# Patient Record
Sex: Female | Born: 1940 | Race: Black or African American | Hispanic: No | Smoking: Never smoker
Health system: Southern US, Community
[De-identification: ages and names within clinical notes are randomized; demographics above are authoritative.]

## PROBLEM LIST (undated history)

## (undated) DIAGNOSIS — E785 Hyperlipidemia, unspecified: Secondary | ICD-10-CM

## (undated) DIAGNOSIS — J45909 Unspecified asthma, uncomplicated: Secondary | ICD-10-CM

## (undated) DIAGNOSIS — I509 Heart failure, unspecified: Secondary | ICD-10-CM

## (undated) DIAGNOSIS — K219 Gastro-esophageal reflux disease without esophagitis: Secondary | ICD-10-CM

## (undated) DIAGNOSIS — I10 Essential (primary) hypertension: Secondary | ICD-10-CM

## (undated) HISTORY — PX: ABDOMINAL HYSTERECTOMY: SHX81

## (undated) HISTORY — PX: BREAST LUMPECTOMY: SHX2

---

## 2017-08-20 ENCOUNTER — Encounter (HOSPITAL_COMMUNITY): Payer: Self-pay

## 2017-08-20 ENCOUNTER — Emergency Department (HOSPITAL_COMMUNITY): Payer: Medicare Other

## 2017-08-20 ENCOUNTER — Inpatient Hospital Stay (HOSPITAL_COMMUNITY)
Admission: EM | Admit: 2017-08-20 | Discharge: 2017-08-24 | DRG: 193 | Disposition: A | Discharge status: Home / Self Care | Payer: Medicare Other | Attending: Internal Medicine | Admitting: Internal Medicine

## 2017-08-20 DIAGNOSIS — I130 Hypertensive heart and chronic kidney disease with heart failure and stage 1 through stage 4 chronic kidney disease, or unspecified chronic kidney disease: Secondary | ICD-10-CM | POA: Diagnosis present

## 2017-08-20 DIAGNOSIS — D860 Sarcoidosis of lung: Secondary | ICD-10-CM

## 2017-08-20 DIAGNOSIS — L8990 Pressure ulcer of unspecified site, unspecified stage: Secondary | ICD-10-CM

## 2017-08-20 DIAGNOSIS — D649 Anemia, unspecified: Secondary | ICD-10-CM | POA: Diagnosis present

## 2017-08-20 DIAGNOSIS — Z7982 Long term (current) use of aspirin: Secondary | ICD-10-CM

## 2017-08-20 DIAGNOSIS — D509 Iron deficiency anemia, unspecified: Secondary | ICD-10-CM | POA: Diagnosis present

## 2017-08-20 DIAGNOSIS — I4581 Long QT syndrome: Secondary | ICD-10-CM | POA: Diagnosis present

## 2017-08-20 DIAGNOSIS — I5033 Acute on chronic diastolic (congestive) heart failure: Secondary | ICD-10-CM | POA: Diagnosis present

## 2017-08-20 DIAGNOSIS — I272 Pulmonary hypertension, unspecified: Secondary | ICD-10-CM | POA: Diagnosis present

## 2017-08-20 DIAGNOSIS — Z7901 Long term (current) use of anticoagulants: Secondary | ICD-10-CM | POA: Diagnosis not present

## 2017-08-20 DIAGNOSIS — Z888 Allergy status to other drugs, medicaments and biological substances status: Secondary | ICD-10-CM

## 2017-08-20 DIAGNOSIS — J189 Pneumonia, unspecified organism: Principal | ICD-10-CM | POA: Diagnosis present

## 2017-08-20 DIAGNOSIS — Z7951 Long term (current) use of inhaled steroids: Secondary | ICD-10-CM

## 2017-08-20 DIAGNOSIS — I480 Paroxysmal atrial fibrillation: Secondary | ICD-10-CM | POA: Diagnosis present

## 2017-08-20 DIAGNOSIS — J440 Chronic obstructive pulmonary disease with acute lower respiratory infection: Secondary | ICD-10-CM | POA: Diagnosis present

## 2017-08-20 DIAGNOSIS — Z90710 Acquired absence of both cervix and uterus: Secondary | ICD-10-CM

## 2017-08-20 DIAGNOSIS — Z885 Allergy status to narcotic agent status: Secondary | ICD-10-CM

## 2017-08-20 DIAGNOSIS — E872 Acidosis: Secondary | ICD-10-CM | POA: Diagnosis present

## 2017-08-20 DIAGNOSIS — G4733 Obstructive sleep apnea (adult) (pediatric): Secondary | ICD-10-CM | POA: Diagnosis present

## 2017-08-20 DIAGNOSIS — L89609 Pressure ulcer of unspecified heel, unspecified stage: Secondary | ICD-10-CM

## 2017-08-20 DIAGNOSIS — K219 Gastro-esophageal reflux disease without esophagitis: Secondary | ICD-10-CM | POA: Diagnosis present

## 2017-08-20 DIAGNOSIS — E785 Hyperlipidemia, unspecified: Secondary | ICD-10-CM | POA: Diagnosis present

## 2017-08-20 DIAGNOSIS — I4510 Unspecified right bundle-branch block: Secondary | ICD-10-CM | POA: Diagnosis present

## 2017-08-20 DIAGNOSIS — L89620 Pressure ulcer of left heel, unstageable: Secondary | ICD-10-CM | POA: Diagnosis not present

## 2017-08-20 DIAGNOSIS — I5032 Chronic diastolic (congestive) heart failure: Secondary | ICD-10-CM

## 2017-08-20 DIAGNOSIS — L89619 Pressure ulcer of right heel, unspecified stage: Secondary | ICD-10-CM | POA: Diagnosis not present

## 2017-08-20 DIAGNOSIS — T462X5A Adverse effect of other antidysrhythmic drugs, initial encounter: Secondary | ICD-10-CM | POA: Diagnosis present

## 2017-08-20 DIAGNOSIS — R0902 Hypoxemia: Secondary | ICD-10-CM

## 2017-08-20 DIAGNOSIS — Z79899 Other long term (current) drug therapy: Secondary | ICD-10-CM

## 2017-08-20 DIAGNOSIS — L89610 Pressure ulcer of right heel, unstageable: Secondary | ICD-10-CM | POA: Diagnosis present

## 2017-08-20 DIAGNOSIS — N182 Chronic kidney disease, stage 2 (mild): Secondary | ICD-10-CM | POA: Diagnosis present

## 2017-08-20 HISTORY — DX: Heart failure, unspecified: I50.9

## 2017-08-20 HISTORY — DX: Essential (primary) hypertension: I10

## 2017-08-20 HISTORY — DX: Gastro-esophageal reflux disease without esophagitis: K21.9

## 2017-08-20 HISTORY — DX: Unspecified asthma, uncomplicated: J45.909

## 2017-08-20 HISTORY — DX: Hyperlipidemia, unspecified: E78.5

## 2017-08-20 LAB — BRAIN NATRIURETIC PEPTIDE: B NATRIURETIC PEPTIDE 5: 386.4 pg/mL — AB (ref 0.0–100.0)

## 2017-08-20 LAB — CBC
HEMATOCRIT: 31.2 % — AB (ref 36.0–46.0)
HEMOGLOBIN: 9.6 g/dL — AB (ref 12.0–15.0)
MCH: 23.4 pg — ABNORMAL LOW (ref 26.0–34.0)
MCHC: 30.8 g/dL (ref 30.0–36.0)
MCV: 75.9 fL — ABNORMAL LOW (ref 78.0–100.0)
Platelets: 166 10*3/uL (ref 150–400)
RBC: 4.11 MIL/uL (ref 3.87–5.11)
RDW: 19.5 % — ABNORMAL HIGH (ref 11.5–15.5)
WBC: 6 10*3/uL (ref 4.0–10.5)

## 2017-08-20 LAB — COMPREHENSIVE METABOLIC PANEL
ALBUMIN: 3.4 g/dL — AB (ref 3.5–5.0)
ALK PHOS: 63 U/L (ref 38–126)
ALT: 19 U/L (ref 14–54)
AST: 42 U/L — AB (ref 15–41)
Anion gap: 15 (ref 5–15)
BILIRUBIN TOTAL: 1.2 mg/dL (ref 0.3–1.2)
BUN: 20 mg/dL (ref 6–20)
CALCIUM: 9.6 mg/dL (ref 8.9–10.3)
CO2: 22 mmol/L (ref 22–32)
CREATININE: 1.5 mg/dL — AB (ref 0.44–1.00)
Chloride: 103 mmol/L (ref 101–111)
GFR calc Af Amer: 38 mL/min — ABNORMAL LOW (ref >60)
GFR calc non Af Amer: 33 mL/min — ABNORMAL LOW (ref >60)
GLUCOSE: 82 mg/dL (ref 65–99)
Potassium: 4.4 mmol/L (ref 3.5–5.1)
SODIUM: 140 mmol/L (ref 135–145)
TOTAL PROTEIN: 7.6 g/dL (ref 6.5–8.1)

## 2017-08-20 LAB — TROPONIN I: Troponin I: <0.03 ng/mL (ref <0.03)

## 2017-08-20 LAB — I-STAT CG4 LACTIC ACID, ED: Lactic Acid, Venous: 3.68 mmol/L (ref 0.5–1.9)

## 2017-08-20 LAB — PROCALCITONIN: Procalcitonin: 0.1 ng/mL

## 2017-08-20 MED ORDER — SODIUM CHLORIDE 0.9 % IV SOLN
500.0000 mg | Freq: Once | INTRAVENOUS | Status: AC
  Administered 2017-08-20: 500 mg via INTRAVENOUS
  Filled 2017-08-20 (×2): qty 500

## 2017-08-20 MED ORDER — POTASSIUM CHLORIDE CRYS ER 20 MEQ PO TBCR
20.0000 meq | EXTENDED_RELEASE_TABLET | Freq: Every day | ORAL | Status: DC
  Administered 2017-08-20 – 2017-08-24 (×5): 20 meq via ORAL
  Filled 2017-08-20 (×5): qty 1

## 2017-08-20 MED ORDER — ATORVASTATIN CALCIUM 80 MG PO TABS
80.0000 mg | ORAL_TABLET | Freq: Every day | ORAL | Status: DC
  Administered 2017-08-20 – 2017-08-23 (×4): 80 mg via ORAL
  Filled 2017-08-20 (×4): qty 1

## 2017-08-20 MED ORDER — IPRATROPIUM-ALBUTEROL 0.5-2.5 (3) MG/3ML IN SOLN
3.0000 mL | Freq: Four times a day (QID) | RESPIRATORY_TRACT | Status: DC
  Administered 2017-08-20: 3 mL via RESPIRATORY_TRACT
  Filled 2017-08-20: qty 3

## 2017-08-20 MED ORDER — SODIUM CHLORIDE 0.9 % IV SOLN
100.0000 mg | Freq: Two times a day (BID) | INTRAVENOUS | Status: DC
  Administered 2017-08-20 – 2017-08-21 (×3): 100 mg via INTRAVENOUS
  Filled 2017-08-20 (×4): qty 100

## 2017-08-20 MED ORDER — SODIUM CHLORIDE 0.9 % IV BOLUS (SEPSIS)
500.0000 mL | Freq: Once | INTRAVENOUS | Status: AC
  Administered 2017-08-20: 500 mL via INTRAVENOUS

## 2017-08-20 MED ORDER — ALLOPURINOL 100 MG PO TABS
200.0000 mg | ORAL_TABLET | Freq: Every day | ORAL | Status: DC
  Administered 2017-08-21 – 2017-08-24 (×4): 200 mg via ORAL
  Filled 2017-08-20 (×5): qty 2

## 2017-08-20 MED ORDER — ASPIRIN EC 81 MG PO TBEC
81.0000 mg | DELAYED_RELEASE_TABLET | Freq: Every day | ORAL | Status: DC
  Administered 2017-08-20 – 2017-08-24 (×5): 81 mg via ORAL
  Filled 2017-08-20 (×5): qty 1

## 2017-08-20 MED ORDER — METOPROLOL SUCCINATE ER 50 MG PO TB24
50.0000 mg | ORAL_TABLET | Freq: Every day | ORAL | Status: DC
  Administered 2017-08-20 – 2017-08-24 (×5): 50 mg via ORAL
  Filled 2017-08-20 (×5): qty 1

## 2017-08-20 MED ORDER — ACETAMINOPHEN 650 MG RE SUPP: 650.0000 mg | Freq: Four times a day (QID) | RECTAL | Status: DC | PRN

## 2017-08-20 MED ORDER — MOMETASONE FURO-FORMOTEROL FUM 200-5 MCG/ACT IN AERO
2.0000 | INHALATION_SPRAY | Freq: Two times a day (BID) | RESPIRATORY_TRACT | Status: DC
  Administered 2017-08-21 – 2017-08-24 (×7): 2 via RESPIRATORY_TRACT
  Filled 2017-08-20: qty 8.8

## 2017-08-20 MED ORDER — AMIODARONE HCL 200 MG PO TABS
200.0000 mg | ORAL_TABLET | Freq: Every day | ORAL | Status: DC
  Administered 2017-08-21 – 2017-08-24 (×4): 200 mg via ORAL
  Filled 2017-08-20 (×5): qty 1

## 2017-08-20 MED ORDER — NIFEDIPINE ER OSMOTIC RELEASE 90 MG PO TB24
90.0000 mg | ORAL_TABLET | Freq: Every day | ORAL | Status: DC
  Administered 2017-08-21 – 2017-08-24 (×4): 90 mg via ORAL
  Filled 2017-08-20 (×4): qty 1

## 2017-08-20 MED ORDER — PANTOPRAZOLE SODIUM 40 MG PO TBEC
40.0000 mg | DELAYED_RELEASE_TABLET | Freq: Every day | ORAL | Status: DC
  Administered 2017-08-20 – 2017-08-24 (×5): 40 mg via ORAL
  Filled 2017-08-20 (×5): qty 1

## 2017-08-20 MED ORDER — PREDNISONE 20 MG PO TABS
40.0000 mg | ORAL_TABLET | Freq: Every day | ORAL | Status: DC
  Administered 2017-08-20 – 2017-08-22 (×2): 40 mg via ORAL
  Filled 2017-08-20 (×2): qty 2

## 2017-08-20 MED ORDER — SODIUM CHLORIDE 0.9 % IV SOLN
1.0000 g | INTRAVENOUS | Status: DC
  Administered 2017-08-21 – 2017-08-24 (×4): 1 g via INTRAVENOUS
  Filled 2017-08-20 (×4): qty 10

## 2017-08-20 MED ORDER — APIXABAN 5 MG PO TABS
5.0000 mg | ORAL_TABLET | Freq: Two times a day (BID) | ORAL | Status: DC
  Administered 2017-08-20 – 2017-08-24 (×8): 5 mg via ORAL
  Filled 2017-08-20 (×8): qty 1

## 2017-08-20 MED ORDER — ACETAMINOPHEN 325 MG PO TABS
650.0000 mg | ORAL_TABLET | Freq: Four times a day (QID) | ORAL | Status: DC | PRN
  Administered 2017-08-21 – 2017-08-23 (×2): 650 mg via ORAL
  Filled 2017-08-20 (×2): qty 2

## 2017-08-20 NOTE — ED Notes (Signed)
Unsuccessful attempt to start IV x 1. Blood work obtained, labeled, and is by the bedside

## 2017-08-20 NOTE — ED Notes (Signed)
Delay explained to patient and family. Pt and family verbalized understanding of delay. Will continue to round.

## 2017-08-20 NOTE — ED Notes (Signed)
Writer notified EDP Campos of abnormal I stat lactic result.  

## 2017-08-20 NOTE — ED Notes (Signed)
Pt placed on 2L, O2 increased to 100%. When O2 titrated down, pt O2 then decreased back to 89%.

## 2017-08-20 NOTE — ED Notes (Addendum)
Unsuccessful attempt to draw second culture. Will notify phlebotomy

## 2017-08-20 NOTE — ED Notes (Signed)
ED Provider at bedside. 

## 2017-08-20 NOTE — ED Triage Notes (Signed)
Pt with hx CHF from [facility] for SOB and productive cough x 2 days. Pt 85% on RA on arrival to PCP, placed on 4L [place]. Per EMS, pt with CXR positive for pneumonia, 20 mg lasix and 1g rocephin IM administered in office. 15mg  albuterol and 0.5 mg atrovent given en route. Denies fever, N/V/D. A&Ox4. EMS VS: 97% 4L [place], 132/70, HR 84, 18 RR, CBG 94.

## 2017-08-20 NOTE — ED Notes (Signed)
Patient transported to X-ray 

## 2017-08-20 NOTE — ED Notes (Signed)
Attempted report x1. 

## 2017-08-20 NOTE — H&P (Signed)
History and Physical    Heather Mckay OHY:[id number] DOB: 17-Jan-1941 DOA: 08/20/2017  PCP: Dionicia Abler, MD   Patient coming from: Home  Chief Complaint: SOB, cough.  HPI: Heather Mckay is a 77 y.o. female with medical history- per care everywhere, significant for- pulmonary sarcoidosis, pulmonary hypertension, atrial fibrillation, OSA, HTN, DM2- ?  Secondary to steroid use, asthma, who presented to the ED complaints of shortness of breath and cough of 2 days duration.  Patient endorses feeling ill over the past 2 days.  No fevers, reports feeling cold but no shaking chills. Mild Chronic and symmetric lower extremity swelling over 2 months duration, no redness or warmth.  Patient reports stable unchanged weight 163 lbs.  Patient reports compliance with Lasix 80 a.m. 40 p.m, and denies excessive salt in diet. Positive history of sick contacts -grandkids.  Not on home O2.  Patient reports he has been off steroids for at least 5-6 months.  Patient presented to her pulmonologist office today, was found to be hypoxic- 85% on room air, chest x-ray suggesting pneumonia and was "to go to the ED via EMS.  It was given 1 g Rocephin and 20 mg Lasix, albuterol and Atrovent en route.  ED Course: O2 sats 86% on room air, placed on 2-4 L O2. WBC-normal 6, creatinine 1.5. BNP- 386. Trop <0.03.  Lactic acid was also checked and was 3.68. Chest x-ray-CHF with mild interstitial edema patchy opacities in the mid and lower lungs-pulmonary edema or superimposed pneumonia.  Hospitalist was called to admit for possible pneumonia versus fluid overload, EDP felt more fluid overload so lactic acidosis was not pursued. Patient was given IV azithromycin for CAP coverage.   Review of Systems: As per HPI otherwise 10 point review of systems negative.    Past Medical History:  Diagnosis Date  . Asthma   . CHF (congestive heart failure) ([place])   . GERD (gastroesophageal reflux disease)   . HLD (hyperlipidemia)   .  Hypertension     Past Surgical History:  Procedure Laterality Date  . ABDOMINAL HYSTERECTOMY    . BREAST LUMPECTOMY Right      reports that  has never smoked. she has never used smokeless tobacco. She reports that she does not drink alcohol or use drugs.  Allergies  Allergen Reactions  . Hydrocodone     Itching  . Imdur [Isosorbide Dinitrate]     Itching  . Percocet [Oxycodone-Acetaminophen]     Itching    No family history on file.  Prior to Admission medications   Medication Sig Start Date End Date Taking? Authorizing Provider  allopurinol (ZYLOPRIM) 100 MG tablet Take 200 mg by mouth daily. 06/02/17  Yes [provider]  amiodarone (PACERONE) 200 MG tablet Take 200 mg by mouth daily. 08/14/17  Yes [provider]  apixaban (ELIQUIS) 5 MG TABS tablet Take 5 mg by mouth 2 (two) times daily. 02/13/17  Yes [provider]  aspirin EC 81 MG tablet Take 81 mg by mouth daily. 08/08/11  Yes [provider]  atorvastatin (LIPITOR) 80 MG tablet Take 80 mg by mouth at bedtime. 11/14/16  Yes [provider]  CALCIUM PO Take 600 mg by mouth daily. 08/17/11  Yes [provider]  celecoxib (CELEBREX) 100 MG capsule Take 100 mg by mouth 2 (two) times daily. 07/31/17  Yes [provider]  diclofenac sodium (VOLTAREN) 1 % GEL Apply 1 application topically as needed (on her hand).  08/06/17  Yes [provider]  ferrous sulfate 325 (65 FE) MG tablet Take 1 tablet by mouth daily. 05/24/17 05/24/18 Yes [provider]  furosemide (LASIX) 40 MG tablet Take 40-80 mg by mouth See admin instructions. Take 40 mg in the morning and 80 mg in the evening 08/18/17  Yes [provider]  metoprolol succinate (TOPROL-XL) 50 MG 24 hr tablet Take 50 mg by mouth daily. 07/26/17  Yes [provider]  NIFEdipine (ADALAT CC) 90 MG 24 hr tablet Take 90 mg by mouth daily. 06/07/17  Yes [provider]  pantoprazole  (PROTONIX) 40 MG tablet Take 40 mg by mouth. 09/20/16  Yes [provider]  potassium chloride SA (K-DUR,KLOR-CON) 20 MEQ tablet Take 20 mEq by mouth daily. 05/24/17 05/24/18 Yes [provider]  SANTYL ointment Apply 1 application topically as needed. 06/26/17  Yes [provider]  SYMBICORT 160-4.5 MCG/ACT inhaler Inhale 2 puffs into the lungs 2 (two) times daily. 06/01/17  Yes [provider]  tetrahydrozoline 0.05 % ophthalmic solution Place 2 drops into both eyes as needed.   Yes [provider]  traMADol (ULTRAM) 50 MG tablet Take 50 mg by mouth 2 (two) times daily. 08/15/17  Yes [provider]  traZODone (DESYREL) 50 MG tablet Take 50 mg by mouth as needed. 05/24/17  Yes [provider]    Physical Exam: Vitals:   08/20/17 1900 08/20/17 1915 08/20/17 1945 08/20/17 2000  BP: 140/63 136/63 (!) 149/70 (!) 146/71  Pulse: 82 81 85 83  Resp: (!) 31 (!) 26 (!) 22 (!) 29  Temp:      TempSrc:      SpO2: 95% 96% 100% 97%  Weight:      Height:        Constitutional: NAD, calm, comfortable Vitals:   08/20/17 1900 08/20/17 1915 08/20/17 1945 08/20/17 2000  BP: 140/63 136/63 (!) 149/70 (!) 146/71  Pulse: 82 81 85 83  Resp: (!) 31 (!) 26 (!) 22 (!) 29  Temp:      TempSrc:      SpO2: 95% 96% 100% 97%  Weight:      Height:       Eyes: PERRL, lids and conjunctivae normal ENMT: Mucous membranes are  Mildly dry.. Posterior pharynx clear of any exudate or lesions.Normal dentition.  Neck: normal, supple, no masses, no thyromegaly Respiratory: Diffuse rhonchorous breath sounds, Normal respiratory effort. No accessory muscle use.  Cardiovascular: Regular rate and rhythm,  Trace to +1 pitting pedal edema bilaterally, with mild hyperpigmentation suggesting chronic venostasis. changes 2+ pedal pulses.  Abdomen: no tenderness, no masses palpated. No hepatosplenomegaly. Bowel sounds positive.  Musculoskeletal: no clubbing / cyanosis. No  joint deformity upper and lower extremities. Good ROM, no contractures. Normal muscle tone.  Skin: ~3 by ~3 cm stage II-III ulcer with purulent drainage right heel, with surrounding tenderness, no appreciable redness. Neurologic: CN 2-12 grossly intact. Sensation intact, DTR normal. Strength 5/5 in all 4.  Psychiatric: Normal judgment and insight. Alert and oriented x 3. Normal mood.      Labs on Admission: I have personally reviewed following labs and imaging studies  CBC: Recent Labs  Lab 08/20/17 1410  WBC 6.0  HGB 9.6*  HCT 31.2*  MCV 75.9*  PLT 371   Basic Metabolic Panel: Recent Labs  Lab 08/20/17 1410  NA 140  K 4.4  CL 103  CO2 22  GLUCOSE 82  BUN 20  CREATININE 1.50*  CALCIUM 9.6   GFR: Estimated Creatinine Clearance: 31.9 mL/min (  A) (by C-G formula based on SCr of 1.5 mg/dL (H)). Liver Function Tests: Recent Labs  Lab 08/20/17 1410  AST 42*  ALT 19  ALKPHOS 63  BILITOT 1.2  PROT 7.6  ALBUMIN 3.4*   Coagulation Profile: No results for input(s): INR, PROTIME in the last 168 hours. Cardiac Enzymes: Recent Labs  Lab 08/20/17 1434  TROPONINI <0.03   Radiological Exams on Admission: Dg Chest 2 View  Result Date: 08/20/2017 CLINICAL DATA:  Two days of shortness of breath and productive cough. History of asthma, COPD, gastroesophageal reflux. EXAM: CHEST - 2 VIEW COMPARISON:  None in PACs FINDINGS: The lungs are well-expanded. The cardiac silhouette is enlarged. The pulmonary vascularity is engorged. There are patchy airspace opacities predominantly in the lower lobes. Small amounts of pleural fluid obscure the costophrenic angles. The bony thorax exhibits no acute abnormality. IMPRESSION: CHF with mild interstitial edema. Patchy airspace opacities in the mid and lower lungs may reflect pulmonary edema or superimposed pneumonia. Electronically Signed   By: David  Martinique M.D.   On: 08/20/2017 13:57    EKG: Independently reviewed.  Sinus rhythm. RBBB.  No  priors to compare.  Assessment/Plan Active Problems:   PNA (pneumonia)   Chronic diastolic CHF (congestive heart failure) (HCC)   Pulmonary sarcoidosis (HCC)   Pulmonary hypertension (HCC)  Shortness of breath- with cough X 2days, hypoxic. Differentials- pulmonary sarcoid flare, also pneumonia or pulmonary edema is suggested by chest x-ray. Stable unchanged weights, chronic unchanged lower extremity swelling. BNP- 386-no priors to compare. WBC- 6.  Patient does not meet sepsis criteria, but Lactic acid was checked in ED-elevated at 3.6.  Lactic acidosis likely from hypoxia with poor tissue oxygenation.  Ceftriaxone-given in route and azithromycin given in ED. -Influenza check - Pt on antibiotics for right lower extremity purulent ulcer, which would cover possible CAP- ceftriaxone and doxycycline (Prolong Qtc). - Check Procalcitonin- 0.1- with recs to discorage initiation of antibiotics.  So doubt pulmonary infection. - Start Prednisone 40mg  daily for possible Pulm sarcoid flare -Continue home diuretics -May need home O2 on discharge  Pulmonary Sarcoid- Not on steroids, follows with pulm at [facility]. Current symptoms likely from sarcoid flare- - Prednisone 40mg  daily.  Right heel decubitus ulcer - with purulence drainage , no appreciable surrounding cellulitis. WBC- 6. Does not meet sepsis criteria. Follows with wound care at [facility], . Recent x-ray of wound 3/13.-Negative for osteomyelitis -Repeat left lower extremity x-ray-negative for osteomyelitis -Will start IV doxycycline for Staph and MRSA coverage and ceftriazone for better strep coverage. - Wound care consult  Atrial fibrillation-rate controlled.  Sinus rhythm. On amiodarone , BB and eliquis -Continue home Eliquis, metop, amiodarone  Chronic Diastholic CHF-patient appears euvolemic with stable chronic lower extremity swelling.  X-rays suggest edema.  20mg  IV lasix given enroute. - Hold durietics for  now.  Renal Insufficiency- Cr- 1.5. Per care every where last check- 1.2, prior to that had normal kidney function. - 511mls given for mildly dry mucus membranes, and slight Cr elevation. - Consider Resuming home lasix- 80 mg a.m, and 40 pm tomorrow. - BMP a.m  Prolonged QTC- 509.  Likely secondary to amiodarone. - Hold home trazodone. D/c azithro started for CAP. -Continue amiodarone repeat EKG a.m.    HTN- Stable. - Cont Home metop, Nifedipine,    DVT prophylaxis: ELiquis  Code Status: Full  Family Communication: Grandchild and DAughter in law at bedside  Disposition Plan:2- 3days Consults called: None  Admission status: Inpt, Med Surg  Bethena Roys MD [facility] Pager [phone] From 6PM-2AM.  Otherwise please contact night-coverage [email or web address] Password [email or web address]  08/20/2017, 8:20 PM

## 2017-08-20 NOTE — ED Provider Notes (Signed)
[facility] EMERGENCY DEPARTMENT Provider Note   CSN: [id number] Arrival date & time: 08/20/17  1303     History   Chief Complaint Chief Complaint  Patient presents with  . Shortness of Breath  . Cough    HPI Heather Mckay is a 77 y.o. female.  HPI 77 year old female with a history of asthma congestive heart failure and pulmonary sarcoidosis who presents to the emergency department from her clinic for shortness of breath over the past 48 hours and was noted to be hypoxic on arrival there.  She was placed on 4 L nasal cannula given Rocephin and a breathing treatment and sent to the ER for further evaluation.  Patient reports chills throughout the weekend without productive cough.  She feels better on oxygen.  No unilateral leg swelling.  She does have a history of congestive heart failure and pulmonary sarcoid.  She states she feels better after bronchodilators.  Denies new orthopnea.  Received IV Lasix at the clinic as well   Past Medical History:  Diagnosis Date  . Asthma   . CHF (congestive heart failure) ([place])   . GERD (gastroesophageal reflux disease)   . HLD (hyperlipidemia)   . Hypertension     There are no active problems to display for this patient.   Past Surgical History:  Procedure Laterality Date  . ABDOMINAL HYSTERECTOMY    . BREAST LUMPECTOMY Right     OB History    No data available       Home Medications    Prior to Admission medications   Not on File    Family History No family history on file.  Social History Social History   Tobacco Use  . Smoking status: Never Smoker  . Smokeless tobacco: Never Used  Substance Use Topics  . Alcohol use: No    Frequency: Never  . Drug use: No     Allergies   Hydrocodone; Imdur [isosorbide dinitrate]; and Percocet [oxycodone-acetaminophen]   Review of Systems Review of Systems  All other systems reviewed and are negative.    Physical Exam Updated Vital Signs BP (!)  141/58   Pulse 88   Temp 98.2 F (36.8 C) (Oral)   Resp (!) 24   Ht 5\' 4"  (1.626 m)   Wt 73.9 kg (163 lb)   SpO2 98%   BMI 27.98 kg/m   Physical Exam  Constitutional: She is oriented to person, place, and time. She appears well-developed and well-nourished. No distress.  HENT:  Head: Normocephalic and atraumatic.  Eyes: EOM are normal.  Neck: Normal range of motion.  Cardiovascular: Normal rate, regular rhythm and normal heart sounds.  Pulmonary/Chest:  Rales bilaterally.  No increased work of breathing.  Abdominal: Soft. She exhibits no distension. There is no tenderness.  Musculoskeletal: Normal range of motion.  Neurological: She is alert and oriented to person, place, and time.  Skin: Skin is warm and dry.  Psychiatric: She has a normal mood and affect. Judgment normal.  Nursing note and vitals reviewed.    ED Treatments / Results  Labs (all labs ordered are listed, but only abnormal results are displayed) Labs Reviewed  CBC - Abnormal; Notable for the following components:      Result Value   Hemoglobin 9.6 (*)    HCT 31.2 (*)    MCV 75.9 (*)    MCH 23.4 (*)    RDW 19.5 (*)    All other components within normal limits  I-STAT CG4 LACTIC ACID,  ED - Abnormal; Notable for the following components:   Lactic Acid, Venous 3.68 (*)    All other components within normal limits  CULTURE, BLOOD (ROUTINE X 2)  CULTURE, BLOOD (ROUTINE X 2)  COMPREHENSIVE METABOLIC PANEL  BRAIN NATRIURETIC PEPTIDE  TROPONIN I    EKG  EKG Interpretation  Date/Time:  Monday August 20 2017 13:15:00 EDT Ventricular Rate:  93 PR Interval:    QRS Duration: 143 QT Interval:  409 QTC Calculation: 509 R Axis:   76 Text Interpretation:  Sinus rhythm Right bundle branch block No old tracing to compare Confirmed by Jola Schmidt [phone]) on 08/20/2017 2:33:24 PM       Radiology Dg Chest 2 View  Result Date: 08/20/2017 CLINICAL DATA:  Two days of shortness of breath and productive cough.  History of asthma, COPD, gastroesophageal reflux. EXAM: CHEST - 2 VIEW COMPARISON:  None in PACs FINDINGS: The lungs are well-expanded. The cardiac silhouette is enlarged. The pulmonary vascularity is engorged. There are patchy airspace opacities predominantly in the lower lobes. Small amounts of pleural fluid obscure the costophrenic angles. The bony thorax exhibits no acute abnormality. IMPRESSION: CHF with mild interstitial edema. Patchy airspace opacities in the mid and lower lungs may reflect pulmonary edema or superimposed pneumonia. Electronically Signed   By: David  Martinique M.D.   On: 08/20/2017 13:57    Procedures Procedures (including critical care time)  Medications Ordered in ED Medications  azithromycin (ZITHROMAX) 500 mg in sodium chloride 0.9 % 250 mL IVPB (500 mg Intravenous New Bag/Given 08/20/17 1534)     Initial Impression / Assessment and Plan / ED Course  I have reviewed the triage vital signs and the nursing notes.  Pertinent labs & imaging results that were available during my care of the patient were reviewed by me and considered in my medical decision making (see chart for details).     Patient received Rocephin at the office.  She will be given azithromycin at this time to add for community acquired pneumonia coverage.  She will need admission to the hospital.  IV diuretics given prior to arrival in the emergency department.  She does have some edema on chest x-ray.  May represent combined pneumonia and heart failure versus unilateral edema  Care to Dr Wilson Singer  Final Clinical Impressions(s) / ED Diagnoses   Final diagnoses:  None    ED Discharge Orders    None       Jola Schmidt, MD 08/20/17 [phone]

## 2017-08-20 NOTE — ED Notes (Signed)
Updated to bed assignment 

## 2017-08-20 NOTE — ED Notes (Signed)
Pt reports call light volume is not working. Facilities called. Pt and family aware.

## 2017-08-21 ENCOUNTER — Other Ambulatory Visit: Payer: Self-pay

## 2017-08-21 DIAGNOSIS — I5032 Chronic diastolic (congestive) heart failure: Secondary | ICD-10-CM

## 2017-08-21 DIAGNOSIS — L8990 Pressure ulcer of unspecified site, unspecified stage: Secondary | ICD-10-CM

## 2017-08-21 LAB — BASIC METABOLIC PANEL
ANION GAP: 15 (ref 5–15)
BUN: 15 mg/dL (ref 6–20)
CO2: 24 mmol/L (ref 22–32)
Calcium: 9.7 mg/dL (ref 8.9–10.3)
Chloride: 101 mmol/L (ref 101–111)
Creatinine, Ser: 1.23 mg/dL — ABNORMAL HIGH (ref 0.44–1.00)
GFR calc Af Amer: 48 mL/min — ABNORMAL LOW (ref >60)
GFR, EST NON AFRICAN AMERICAN: 42 mL/min — AB (ref >60)
Glucose, Bld: 105 mg/dL — ABNORMAL HIGH (ref 65–99)
POTASSIUM: 4.1 mmol/L (ref 3.5–5.1)
SODIUM: 140 mmol/L (ref 135–145)

## 2017-08-21 LAB — INFLUENZA PANEL BY PCR (TYPE A & B)
INFLAPCR: NEGATIVE
Influenza B By PCR: NEGATIVE

## 2017-08-21 LAB — PROCALCITONIN: PROCALCITONIN: 0.1 ng/mL

## 2017-08-21 MED ORDER — IPRATROPIUM-ALBUTEROL 0.5-2.5 (3) MG/3ML IN SOLN
3.0000 mL | Freq: Four times a day (QID) | RESPIRATORY_TRACT | Status: DC
  Administered 2017-08-21 – 2017-08-24 (×13): 3 mL via RESPIRATORY_TRACT
  Filled 2017-08-21 (×13): qty 3

## 2017-08-21 MED ORDER — COLLAGENASE 250 UNIT/GM EX OINT
TOPICAL_OINTMENT | Freq: Every day | CUTANEOUS | Status: DC
  Administered 2017-08-21 – 2017-08-24 (×4): via TOPICAL
  Filled 2017-08-21: qty 30

## 2017-08-21 MED ORDER — FUROSEMIDE 10 MG/ML IJ SOLN
40.0000 mg | Freq: Two times a day (BID) | INTRAMUSCULAR | Status: DC
  Administered 2017-08-21 – 2017-08-22 (×3): 40 mg via INTRAVENOUS
  Filled 2017-08-21 (×3): qty 4

## 2017-08-21 NOTE — Consult Note (Signed)
[facility] Nurse wound consult note Reason for Consult:Unstageable pressure injury to right heel.  75% adherent slough to wound bed.  Seen at wound care center in [place].  Has been using a silver product but unsure which one. Wound type:unstageable pressure injury Pressure Injury POA: Yes Measurement: 3.2 cm x 2.4 cm wound bed is 75% slough, unable to appreciate depth.  Wound bed:75% slough, 25% pale pink nongranulating Drainage (amount, consistency, odor) tender with dressing change.  Minimal serosanguinous drainage.   Periwound: thin callous to periwound present.  Dressing procedure/placement/frequency:Cleanse wound to right heel with NS and pat dry.  Apply Santyl to wound bed.  Cover with Ns moist 2x2.  Cover with gauze and kerlix/tape. Change daily.  Will not follow at this time.  Please re-consult if needed.  Domenic Moras RN BSN [place] Pager [phone]

## 2017-08-21 NOTE — Discharge Instructions (Signed)
Information on my medicine - ELIQUIS® (apixaban) ° °Why was Eliquis® prescribed for you? °Eliquis® was prescribed for you to reduce the risk of a blood clot forming that can cause a stroke if you have a medical condition called atrial fibrillation (a type of irregular heartbeat). ° °What do You need to know about Eliquis® ? °Take your Eliquis® TWICE DAILY - one tablet in the morning and one tablet in the evening with or without food. If you have difficulty swallowing the tablet whole please discuss with your pharmacist how to take the medication safely. ° °Take Eliquis® exactly as prescribed by your doctor and DO NOT stop taking Eliquis® without talking to the doctor who prescribed the medication.  Stopping may increase your risk of developing a stroke.  Refill your prescription before you run out. ° °After discharge, you should have regular check-up appointments with your healthcare provider that is prescribing your Eliquis®.  In the future your dose may need to be changed if your kidney function or weight changes by a significant amount or as you get older. ° °What do you do if you miss a dose? °If you miss a dose, take it as soon as you remember on the same day and resume taking twice daily.  Do not take more than one dose of ELIQUIS at the same time to make up a missed dose. ° °Important Safety Information °A possible side effect of Eliquis® is bleeding. You should call your healthcare provider right away if you experience any of the following: °? Bleeding from an injury or your nose that does not stop. °? Unusual colored urine (red or dark [place]) or unusual colored stools (red or black). °? Unusual bruising for unknown reasons. °? A serious fall or if you hit your head (even if there is no bleeding). ° °Some medicines may interact with Eliquis® and might increase your risk of bleeding or clotting while on Eliquis®. To help avoid this, consult your healthcare provider or pharmacist prior to using any new  prescription or non-prescription medications, including herbals, vitamins, non-steroidal anti-inflammatory drugs (NSAIDs) and supplements. ° °This website has more information on Eliquis® (apixaban): http://www.eliquis.com/eliquis/home ° °

## 2017-08-21 NOTE — Progress Notes (Signed)
PROGRESS NOTE    Heather Mckay  OAC:[id number] DOB: Jul 15, 1940 DOA: 08/20/2017 PCP: Dionicia Abler, MD  Brief Heather.Mckay y.o. female with medical history- per care everywhere, significant for- pulmonary sarcoidosis, pulmonary hypertension, atrial fibrillation, OSA, HTN, DM2- ?  Secondary to steroid use, asthma, who presented to the ED complaints of shortness of breath and cough of 2 days duration.  Patient endorses feeling ill over the past 2 days.  No fevers, reports feeling cold but no shaking chills. Mild Chronic and symmetric lower extremity swelling over 2 months duration, no redness or warmth.  Patient reports stable unchanged weight 163 lbs.  Patient reports compliance with Lasix 80 a.m. 40 p.m, and denies excessive salt in diet. Positive history of sick contacts -grandkids.  Not on home O2.  Patient reports he has been off steroids for at least 5-6 months.  Patient presented to her pulmonologist office today, was found to be hypoxic- 85% on room air, chest x-ray suggesting pneumonia and was "to go to the ED via EMS.  It was given 1 g Rocephin and 20 mg Lasix, albuterol and Atrovent en route.  ED Course: O2 sats 86% on room air, placed on 2-4 L O2. WBC-normal 6, creatinine 1.5. BNP- 386. Trop <0.03.  Lactic acid was also checked and was 3.68. Chest x-ray-CHF with mild interstitial edema patchy opacities in the mid and lower lungs-pulmonary edema or superimposed pneumonia.  Hospitalist was called to admit for possible pneumonia versus fluid overload, EDP felt more fluid overload so lactic acidosis was not pursued. Patient was given IV azithromycin for CAP coverage.  08/21/2017 patient's resting in bed on CPAP.  Patient uses CPAP at home.  She appeared in no acute distress was able to speak to me in full sentences.  She felt her breathing was better than yesterday.  She is currently on IV antibiotics as well as she got to steroids and she did receive a dose of Lasix on the way to the emergency  room.   Assessment & Plan:   Active Problems:   PNA (pneumonia)   Chronic diastolic CHF (congestive heart failure) (HCC)   Pulmonary sarcoidosis (HCC)   Pulmonary hypertension (HCC)   Pressure injury of skin   Shortness of breath-CHF versus pneumonia versus sarcoid flare.  With cough X 2days, hypoxic. Differentials- pulmonary sarcoid flare, also pneumonia or pulmonary edema is suggested by chest x-ray. Stable unchanged weights, chronic unchanged lower extremity swelling. BNP- 386-no priors to compare. WBC- 6.  Patient does not meet sepsis criteria, but Lactic acid was checked in ED-elevated at 3.6.  Lactic acidosis likely from hypoxia with poor tissue oxygenation.  Ceftriaxone-given in route and azithromycin given in ED. -Influenza A  and B-.  Negative. - Pt on antibiotics for right lower extremity purulent ulcer, which would cover possible CAP- ceftriaxone and doxycycline (Prolong Qtc). - Check Procalcitonin- 0.1- with recs to discorage initiation of antibiotics.  So doubt pulmonary infection. - Start Prednisone 40mg  daily for possible Pulm sarcoid flare -Continue home diuretics -May need home O2 on discharge  Pulmonary Sarcoid- Not on steroids, follows with pulm at [facility]. Current symptoms likely from sarcoid flare- - Prednisone 40mg  daily.  Right heel decubitus ulcer - with purulence drainage , no appreciable surrounding cellulitis. WBC- 6. Does not meet sepsis criteria. Follows with wound care at [facility], . Recent x-ray of wound 3/13.-Negative for osteomyelitis -Repeat left lower extremity x-ray-negative for osteomyelitis -Will start IV doxycycline for Staph and MRSA coverage and ceftriazone for  better strep coverage. - Wound care consult  Atrial fibrillation-rate controlled.  Sinus rhythm. On amiodarone , BB and eliquis -Continue home Eliquis, metop, amiodarone  Chronic Diastholic CHF-patient appears euvolemic with stable chronic lower extremity  swelling.  X-rays suggest edema.  20mg  IV lasix given enroute.  Restart Lasix 40 mg twice a day.  Patient takes 80 in the morning and 40 at night at home.  Renal Insufficiency- Cr- 1.5. Per care every where last check- 1.2, prior to that had normal kidney function. - 549mls given for mildly dry mucus membranes, and slight Cr elevation. Restart Lasix 40 g twice a day.  Patient does take 80 in the morning and 40 at night.  Increase the dose as needed- BMP a.m  Prolonged QTC- 509.  Likely secondary to amiodarone. - Hold home trazodone. D/c azithro started for CAP. -Continue amiodarone repeat EKG a.m.    HTN- Stable. - Cont Home metop, Nifedipine,       DVT prophylaxis:ELIQUIS Code Status: FULL Family Communication:NONE Disposition Plan:TBD Consultants:  NONE Procedures:NONE Antimicrobials: Doxycycline, Rocephin Subjective: Feels breathing is better.   Objective: Vitals:   08/20/17 2119 08/20/17 2145 08/21/17 0035 08/21/17 0500  BP:  (!) 188/77    Pulse: 84 99 85   Resp: 20 20 20    Temp:  98.2 F (36.8 C)    TempSrc:  Oral    SpO2: 96% 93% 91%   Weight:  77.9 kg (171 lb 11.8 oz)  77.8 kg (171 lb 8.3 oz)  Height:  5\' 4"  (1.626 m)      Intake/Output Summary (Last 24 hours) at 08/21/2017 0832 Last data filed at 08/21/2017 0819 Gross per 24 hour  Intake 990 ml  Output -  Net 990 ml   Filed Weights   08/20/17 1312 08/20/17 2145 08/21/17 0500  Weight: 73.9 kg (163 lb) 77.9 kg (171 lb 11.8 oz) 77.8 kg (171 lb 8.3 oz)    Examination:  General exam: Appears calm and comfortable  Respiratory system: Clear to auscultation. Respiratory effort normal. Cardiovascular system: S1 & S2 heard, RRR. No JVD, murmurs, rubs, gallops or clicks. No pedal edema. Gastrointestinal system: Abdomen is nondistended, soft and nontender. No organomegaly or masses felt. Normal bowel sounds heard. Central nervous system: Alert and oriented. No focal neurological deficits. Extremities:  Symmetric 5 x 5 power. Skin: No rashes, lesions or ulcers Psychiatry: Judgement and insight appear normal. Mood & affect appropriate.     Data Reviewed: I have personally reviewed following labs and imaging studies  CBC: Recent Labs  Lab 08/20/17 1410  WBC 6.0  HGB 9.6*  HCT 31.2*  MCV 75.9*  PLT 825   Basic Metabolic Panel: Recent Labs  Lab 08/20/17 1410 08/21/17 0443  NA 140 140  K 4.4 4.1  CL 103 101  CO2 22 24  GLUCOSE 82 105*  BUN 20 15  CREATININE 1.50* 1.23*  CALCIUM 9.6 9.7   GFR: Estimated Creatinine Clearance: 39.9 mL/min (A) (by C-G formula based on SCr of 1.23 mg/dL (H)). Liver Function Tests: Recent Labs  Lab 08/20/17 1410  AST 42*  ALT 19  ALKPHOS 63  BILITOT 1.2  PROT 7.6  ALBUMIN 3.4*   No results for input(s): LIPASE, AMYLASE in the last 168 hours. No results for input(s): AMMONIA in the last 168 hours. Coagulation Profile: No results for input(s): INR, PROTIME in the last 168 hours. Cardiac Enzymes: Recent Labs  Lab 08/20/17 1434  TROPONINI <0.03   BNP (last 3 results) No results for  input(s): PROBNP in the last 8760 hours. HbA1C: No results for input(s): HGBA1C in the last 72 hours. CBG: No results for input(s): GLUCAP in the last 168 hours. Lipid Profile: No results for input(s): CHOL, HDL, LDLCALC, TRIG, CHOLHDL, LDLDIRECT in the last 72 hours. Thyroid Function Tests: No results for input(s): TSH, T4TOTAL, FREET4, T3FREE, THYROIDAB in the last 72 hours. Anemia Panel: No results for input(s): VITAMINB12, FOLATE, FERRITIN, TIBC, IRON, RETICCTPCT in the last 72 hours. Sepsis Labs: Recent Labs  Lab 08/20/17 1515 08/20/17 2016 08/21/17 0443  PROCALCITON  --  0.10 0.10  LATICACIDVEN 3.68*  --   --     No results found for this or any previous visit (from the past 240 hour(s)).       Radiology Studies: Dg Chest 2 View  Result Date: 08/20/2017 CLINICAL DATA:  Two days of shortness of breath and productive cough.  History of asthma, COPD, gastroesophageal reflux. EXAM: CHEST - 2 VIEW COMPARISON:  None in PACs FINDINGS: The lungs are well-expanded. The cardiac silhouette is enlarged. The pulmonary vascularity is engorged. There are patchy airspace opacities predominantly in the lower lobes. Small amounts of pleural fluid obscure the costophrenic angles. The bony thorax exhibits no acute abnormality. IMPRESSION: CHF with mild interstitial edema. Patchy airspace opacities in the mid and lower lungs may reflect pulmonary edema or superimposed pneumonia. Electronically Signed   By: David  Martinique M.D.   On: 08/20/2017 13:57   Dg Foot Complete Right  Result Date: 08/20/2017 CLINICAL DATA:  Heel ulcer EXAM: RIGHT FOOT COMPLETE - 3+ VIEW COMPARISON:  None. FINDINGS: Soft tissue defect is noted consistent with the given clinical history. No underlying bony destruction to suggest osteomyelitis is noted. Mild tarsal degenerative changes are seen as well as calcaneal spurs. Mild vascular calcifications are noted. No acute fracture is seen. IMPRESSION: Soft tissue wound without evidence of osteomyelitis. Mild degenerative change. Electronically Signed   By: Inez Catalina M.D.   On: 08/20/2017 20:40        Scheduled Meds: . allopurinol  200 mg Oral Daily  . amiodarone  200 mg Oral Daily  . apixaban  5 mg Oral BID  . aspirin EC  81 mg Oral Daily  . atorvastatin  80 mg Oral QHS  . ipratropium-albuterol  3 mL Nebulization QID  . metoprolol succinate  50 mg Oral Daily  . mometasone-formoterol  2 puff Inhalation BID  . NIFEdipine  90 mg Oral Daily  . pantoprazole  40 mg Oral Daily  . potassium chloride SA  20 mEq Oral Daily  . predniSONE  40 mg Oral Q breakfast   Continuous Infusions: . cefTRIAXone (ROCEPHIN)  IV    . doxycycline (VIBRAMYCIN) IV Stopped (08/21/17 0054)     LOS: 1 day    Georgette Shell, MD [facility] If 7PM-7AM, please contact night-coverage [email or web address] Password [facility] 08/21/2017,  8:32 AM

## 2017-08-22 ENCOUNTER — Inpatient Hospital Stay (HOSPITAL_COMMUNITY): Payer: Medicare Other

## 2017-08-22 DIAGNOSIS — J189 Pneumonia, unspecified organism: Principal | ICD-10-CM

## 2017-08-22 DIAGNOSIS — I272 Pulmonary hypertension, unspecified: Secondary | ICD-10-CM

## 2017-08-22 DIAGNOSIS — L89619 Pressure ulcer of right heel, unspecified stage: Secondary | ICD-10-CM

## 2017-08-22 LAB — CBC WITH DIFFERENTIAL/PLATELET
BASOS ABS: 0 10*3/uL (ref 0.0–0.1)
BASOS PCT: 0 %
EOS PCT: 0 %
Eosinophils Absolute: 0 10*3/uL (ref 0.0–0.7)
HCT: 26.4 % — ABNORMAL LOW (ref 36.0–46.0)
Hemoglobin: 8.1 g/dL — ABNORMAL LOW (ref 12.0–15.0)
LYMPHS PCT: 21 %
Lymphs Abs: 1.4 10*3/uL (ref 0.7–4.0)
MCH: 23.2 pg — ABNORMAL LOW (ref 26.0–34.0)
MCHC: 30.7 g/dL (ref 30.0–36.0)
MCV: 75.6 fL — ABNORMAL LOW (ref 78.0–100.0)
MONO ABS: 0.4 10*3/uL (ref 0.1–1.0)
Monocytes Relative: 7 %
Neutro Abs: 4.6 10*3/uL (ref 1.7–7.7)
Neutrophils Relative %: 72 %
PLATELETS: 147 10*3/uL — AB (ref 150–400)
RBC: 3.49 MIL/uL — ABNORMAL LOW (ref 3.87–5.11)
RDW: 19.4 % — AB (ref 11.5–15.5)
WBC: 6.4 10*3/uL (ref 4.0–10.5)

## 2017-08-22 LAB — BASIC METABOLIC PANEL
ANION GAP: 11 (ref 5–15)
BUN: 16 mg/dL (ref 6–20)
CALCIUM: 8.8 mg/dL — AB (ref 8.9–10.3)
CO2: 27 mmol/L (ref 22–32)
Chloride: 102 mmol/L (ref 101–111)
Creatinine, Ser: 1.28 mg/dL — ABNORMAL HIGH (ref 0.44–1.00)
GFR calc Af Amer: 46 mL/min — ABNORMAL LOW (ref >60)
GFR, EST NON AFRICAN AMERICAN: 40 mL/min — AB (ref >60)
GLUCOSE: 79 mg/dL (ref 65–99)
POTASSIUM: 3.3 mmol/L — AB (ref 3.5–5.1)
Sodium: 140 mmol/L (ref 135–145)

## 2017-08-22 LAB — PROCALCITONIN: PROCALCITONIN: 0.21 ng/mL

## 2017-08-22 MED ORDER — FUROSEMIDE 10 MG/ML IJ SOLN
40.0000 mg | Freq: Two times a day (BID) | INTRAMUSCULAR | Status: AC
  Administered 2017-08-22: 40 mg via INTRAVENOUS
  Filled 2017-08-22: qty 4

## 2017-08-22 MED ORDER — METHYLPREDNISOLONE SODIUM SUCC 125 MG IJ SOLR
60.0000 mg | Freq: Two times a day (BID) | INTRAMUSCULAR | Status: DC
  Administered 2017-08-22: 62.5 mg via INTRAVENOUS
  Administered 2017-08-22 – 2017-08-23 (×2): 60 mg via INTRAVENOUS
  Filled 2017-08-22 (×3): qty 2

## 2017-08-22 MED ORDER — AZITHROMYCIN 250 MG PO TABS
500.0000 mg | ORAL_TABLET | Freq: Every day | ORAL | Status: DC
  Administered 2017-08-22 – 2017-08-24 (×3): 500 mg via ORAL
  Filled 2017-08-22 (×3): qty 2

## 2017-08-22 NOTE — Progress Notes (Signed)
PROGRESS NOTE    Carri Spillers  WNI:[id number] DOB: Nov 06, 1940 DOA: 08/20/2017 PCP: Dionicia Abler, MD  Brief Narrative: Malin Sambrano is a 77 y.o. female with medical history- per care everywhere, significant for- pulmonary sarcoidosis, pulmonary hypertension, atrial fibrillation, OSA, HTN, DM2- ?  Secondary to steroid use, asthma, who presented to the ED complaints of shortness of breath and cough of 2 days duration.  Patient endorses feeling ill over the past 2 days.  No fevers, reports feeling cold but no shaking chills. Mild Chronic and symmetric lower extremity swelling over 2 months duration, no redness or warmth.  Patient reports stable unchanged weight 163 lbs.  Patient reports compliance with Lasix 80 a.m. 40 p.m, and denies excessive salt in diet. Positive history of sick contacts -grandkids.  Not on home O2.  Patient reports he has been off steroids for at least 5-6 months.  Patient presented to her pulmonologist office today, was found to be hypoxic- 85% on room air, chest x-ray suggesting pneumonia and was "to go to the ED via EMS  Assessment & Plan:    Community acquired pneumonia -Patient presented with productive cough, dyspnea, preceded by URI and positive sick contacts -Chest x-ray with questionable opacities versus edema. Repeat x-ray today -continue ceftriaxone, will add azithromycin, stop prednisone add IV Solu-Medrol for wheezing and likely sarcoidosis flare -follow up blood cultures -Influenza PCR is negative -Needs follow-up chest x-ray in 4-6 weeks  Acute on chronic diastolic CHF -last echo with preserved ejection fraction -continue IV Lasix today -Transition back to oral Lasix in 1-2 days    Pulmonary sarcoidosis  With acute flare -Stop prednisone, start IV Solu-Medrol today, -Monitor clinically -Prednisone taper at discharge -follow-[facility] with pulmonary at [facility]   Right heel likely pressure ulcer -Minimal purulence at the ulcer base, no evidence of  surrounding cellulitis -X-ray negative for osteomyelitis -Wound care consult -Does not even need antibiotics at this time for this  Chronic anemia -Baseline hemoglobin in the 8-9 range per chart review from caregiver -Stable at baseline, follow-up with PCP for further workup  Paroxysmal atrial fibrillation -In sinus rhythm, continue amiodarone beta blocker and Eliquis  Chronic kidney disease stage II-3 -Continue IV Lasix today and restart home dose of diuretics tomorrow  Prolong QTC -Stopped trazodone -Continue amiodarone -Repeat EKG   DVT prophylaxis: Eliquis Code Status: Full Code Family Communication:no family at bedside Disposition Plan: home in 1-2 days  Consultants:      Procedures:   Antimicrobials:    Subjective: -feels better, breathing improving  Objective: Vitals:   08/21/17 2115 08/21/17 2325 08/22/17 0455 08/22/17 0622  BP: (!) 158/65  (!) 124/53   Pulse: 82 81 74   Resp: 17 18 17    Temp: 98.1 F (36.7 C)  99.3 F (37.4 C)   TempSrc: Oral  Axillary   SpO2: 94% 95% 99%   Weight:    76.7 kg (169 lb)  Height:        Intake/Output Summary (Last 24 hours) at 08/22/2017 1314 Last data filed at 08/22/2017 1035 Gross per 24 hour  Intake 960 ml  Output 1625 ml  Net -665 ml   Filed Weights   08/20/17 2145 08/21/17 0500 08/22/17 0622  Weight: 77.9 kg (171 lb 11.8 oz) 77.8 kg (171 lb 8.3 oz) 76.7 kg (169 lb)    Examination:  General exam: Appears calm and comfortable, no distress Respiratory system: . Scattered rhonchi and expiratory wheezes Cardiovascular system: S1 & S2 heard, RRR.  Gastrointestinal system: Abdomen  is nondistended, soft and nontender.Normal bowel sounds heard. Central nervous system: Alert and oriented. No focal neurological deficits. Extremities: Symmetric 5 x 5 power., right heel with ulcer with minimal purulence at the base Skin: right heel ulcer as noted above Psychiatry: Judgement and insight appear normal. Mood &  affect appropriate.     Data Reviewed:   CBC: Recent Labs  Lab 08/20/17 1410 08/22/17 0531  WBC 6.0 6.4  NEUTROABS  --  4.6  HGB 9.6* 8.1*  HCT 31.2* 26.4*  MCV 75.9* 75.6*  PLT 166 176*   Basic Metabolic Panel: Recent Labs  Lab 08/20/17 1410 08/21/17 0443 08/22/17 0531  NA 140 140 140  K 4.4 4.1 3.3*  CL 103 101 102  CO2 22 24 27   GLUCOSE 82 105* 79  BUN 20 15 16   CREATININE 1.50* 1.23* 1.28*  CALCIUM 9.6 9.7 8.8*   GFR: Estimated Creatinine Clearance: 38.1 mL/min (A) (by C-G formula based on SCr of 1.28 mg/dL (H)). Liver Function Tests: Recent Labs  Lab 08/20/17 1410  AST 42*  ALT 19  ALKPHOS 63  BILITOT 1.2  PROT 7.6  ALBUMIN 3.4*   No results for input(s): LIPASE, AMYLASE in the last 168 hours. No results for input(s): AMMONIA in the last 168 hours. Coagulation Profile: No results for input(s): INR, PROTIME in the last 168 hours. Cardiac Enzymes: Recent Labs  Lab 08/20/17 1434  TROPONINI <0.03   BNP (last 3 results) No results for input(s): PROBNP in the last 8760 hours. HbA1C: No results for input(s): HGBA1C in the last 72 hours. CBG: No results for input(s): GLUCAP in the last 168 hours. Lipid Profile: No results for input(s): CHOL, HDL, LDLCALC, TRIG, CHOLHDL, LDLDIRECT in the last 72 hours. Thyroid Function Tests: No results for input(s): TSH, T4TOTAL, FREET4, T3FREE, THYROIDAB in the last 72 hours. Anemia Panel: No results for input(s): VITAMINB12, FOLATE, FERRITIN, TIBC, IRON, RETICCTPCT in the last 72 hours. Urine analysis: No results found for: COLORURINE, APPEARANCEUR, LABSPEC, PHURINE, GLUCOSEU, HGBUR, BILIRUBINUR, KETONESUR, PROTEINUR, UROBILINOGEN, NITRITE, LEUKOCYTESUR Sepsis Labs: @LABRCNTIP (procalcitonin:4,lacticidven:4)  ) Recent Results (from the past 240 hour(s))  Blood culture (routine x 2)     Status: None (Preliminary result)   Collection Time: 08/20/17  3:21 PM  Result Value Ref Range Status   Specimen Description  BLOOD RIGHT ANTECUBITAL  Final   Special Requests   Final    BOTTLES DRAWN AEROBIC AND ANAEROBIC Blood Culture adequate volume   Culture   Final    NO GROWTH < 24 HOURS Performed at [facility], [address], [place], [place] [address]    Report Status PENDING  Incomplete  Blood culture (routine x 2)     Status: None (Preliminary result)   Collection Time: 08/20/17  4:00 PM  Result Value Ref Range Status   Specimen Description BLOOD LEFT ANTECUBITAL  Final   Special Requests   Final    BOTTLES DRAWN AEROBIC AND ANAEROBIC Blood Culture adequate volume   Culture   Final    NO GROWTH < 24 HOURS Performed at [facility], [place] [address], [place], [place] [address]    Report Status PENDING  Incomplete         Radiology Studies: Dg Chest 2 View  Result Date: 08/22/2017 CLINICAL DATA:  Hypoxia EXAM: CHEST - 2 VIEW COMPARISON:  08/20/2017 FINDINGS: Cardiac shadow remains enlarged. Central vascular congestion is noted with interstitial changes. Some evolving density is noted in the left mid lung when compared  with the prior exam which may represent focal infiltrate superimposed on the changes of interstitial edema. Multiple calcified nodes are noted consistent with prior granulomatous disease. IMPRESSION: Changes of CHF with interstitial edema. Developing left mid lung infiltrate is noted projecting in the superior segment of the left lower lobe. Electronically Signed   By: Inez Catalina M.D.   On: 08/22/2017 10:09   Dg Chest 2 View  Result Date: 08/20/2017 CLINICAL DATA:  Two days of shortness of breath and productive cough. History of asthma, COPD, gastroesophageal reflux. EXAM: CHEST - 2 VIEW COMPARISON:  None in PACs FINDINGS: The lungs are well-expanded. The cardiac silhouette is enlarged. The pulmonary vascularity is engorged. There are patchy airspace opacities predominantly in the lower lobes. Small amounts of pleural fluid obscure the costophrenic angles. The bony thorax  exhibits no acute abnormality. IMPRESSION: CHF with mild interstitial edema. Patchy airspace opacities in the mid and lower lungs may reflect pulmonary edema or superimposed pneumonia. Electronically Signed   By: David  Martinique M.D.   On: 08/20/2017 13:57   Dg Foot Complete Right  Result Date: 08/20/2017 CLINICAL DATA:  Heel ulcer EXAM: RIGHT FOOT COMPLETE - 3+ VIEW COMPARISON:  None. FINDINGS: Soft tissue defect is noted consistent with the given clinical history. No underlying bony destruction to suggest osteomyelitis is noted. Mild tarsal degenerative changes are seen as well as calcaneal spurs. Mild vascular calcifications are noted. No acute fracture is seen. IMPRESSION: Soft tissue wound without evidence of osteomyelitis. Mild degenerative change. Electronically Signed   By: Inez Catalina M.D.   On: 08/20/2017 20:40        Scheduled Meds: . allopurinol  200 mg Oral Daily  . amiodarone  200 mg Oral Daily  . apixaban  5 mg Oral BID  . aspirin EC  81 mg Oral Daily  . atorvastatin  80 mg Oral QHS  . azithromycin  500 mg Oral Daily  . collagenase   Topical Daily  . furosemide  40 mg Intravenous Q12H  . ipratropium-albuterol  3 mL Nebulization QID  . methylPREDNISolone (SOLU-MEDROL) injection  60 mg Intravenous Q12H  . metoprolol succinate  50 mg Oral Daily  . mometasone-formoterol  2 puff Inhalation BID  . NIFEdipine  90 mg Oral Daily  . pantoprazole  40 mg Oral Daily  . potassium chloride SA  20 mEq Oral Daily   Continuous Infusions: . cefTRIAXone (ROCEPHIN)  IV Stopped (08/21/17 1459)     LOS: 2 days    Time spent: 32min    Domenic Polite, MD [facility] Page via [email or web address], password [email or web address] After 7PM please contact night-coverage  08/22/2017, 1:14 PM

## 2017-08-22 NOTE — Consult Note (Addendum)
WOC consult requested for heel wound.  This was already performed; refer to previous progress WOC consult on 3/19 for assessment and measurements, and topical treatment orders have been provided for staff nurses to perform. Please re-consult if further assistance is needed.  Thank-you,  Julien Girt MSN, [place], [place], [place], [place]

## 2017-08-23 DIAGNOSIS — R0902 Hypoxemia: Secondary | ICD-10-CM

## 2017-08-23 DIAGNOSIS — L89620 Pressure ulcer of left heel, unstageable: Secondary | ICD-10-CM

## 2017-08-23 LAB — BASIC METABOLIC PANEL
Anion gap: 11 (ref 5–15)
BUN: 28 mg/dL — AB (ref 6–20)
CALCIUM: 8.7 mg/dL — AB (ref 8.9–10.3)
CO2: 26 mmol/L (ref 22–32)
Chloride: 103 mmol/L (ref 101–111)
Creatinine, Ser: 1.39 mg/dL — ABNORMAL HIGH (ref 0.44–1.00)
GFR calc Af Amer: 42 mL/min — ABNORMAL LOW (ref >60)
GFR calc non Af Amer: 36 mL/min — ABNORMAL LOW (ref >60)
Glucose, Bld: 171 mg/dL — ABNORMAL HIGH (ref 65–99)
Potassium: 3.9 mmol/L (ref 3.5–5.1)
Sodium: 140 mmol/L (ref 135–145)

## 2017-08-23 LAB — CBC
HEMATOCRIT: 27.8 % — AB (ref 36.0–46.0)
Hemoglobin: 8.7 g/dL — ABNORMAL LOW (ref 12.0–15.0)
MCH: 23.6 pg — AB (ref 26.0–34.0)
MCHC: 31.3 g/dL (ref 30.0–36.0)
MCV: 75.3 fL — AB (ref 78.0–100.0)
PLATELETS: 160 10*3/uL (ref 150–400)
RBC: 3.69 MIL/uL — ABNORMAL LOW (ref 3.87–5.11)
RDW: 19.1 % — AB (ref 11.5–15.5)
WBC: 5.1 10*3/uL (ref 4.0–10.5)

## 2017-08-23 MED ORDER — METHYLPREDNISOLONE SODIUM SUCC 125 MG IJ SOLR
60.0000 mg | Freq: Two times a day (BID) | INTRAMUSCULAR | Status: AC
  Administered 2017-08-23: 60 mg via INTRAVENOUS
  Filled 2017-08-23: qty 2

## 2017-08-23 MED ORDER — PREDNISONE 20 MG PO TABS
40.0000 mg | ORAL_TABLET | Freq: Every day | ORAL | Status: DC
  Administered 2017-08-24: 40 mg via ORAL
  Filled 2017-08-23: qty 2

## 2017-08-23 MED ORDER — ZOLPIDEM TARTRATE 5 MG PO TABS
5.0000 mg | ORAL_TABLET | Freq: Once | ORAL | Status: AC
  Administered 2017-08-24: 5 mg via ORAL
  Filled 2017-08-23: qty 1

## 2017-08-23 MED ORDER — FUROSEMIDE 10 MG/ML IJ SOLN
40.0000 mg | Freq: Two times a day (BID) | INTRAMUSCULAR | Status: AC
  Administered 2017-08-23: 40 mg via INTRAVENOUS
  Filled 2017-08-23: qty 4

## 2017-08-23 NOTE — Progress Notes (Signed)
PROGRESS NOTE    Heather Mckay  ZSW:[id number] DOB: May 18, 1941 DOA: 08/20/2017 PCP: Dionicia Abler, MD  Brief Narrative: Heather Mckay is a 77 y.o. female with medical history- per care everywhere, significant for- pulmonary sarcoidosis, pulmonary hypertension, atrial fibrillation, OSA, HTN, DM2- ?  Secondary to steroid use, asthma, who presented to the ED complaints of shortness of breath and cough of 2 days duration.  Patient endorses feeling ill over the past 2 days.  No fevers, reports feeling cold but no shaking chills. Mild Chronic and symmetric lower extremity swelling over 2 months duration, no redness or warmth.  Patient reports stable unchanged weight 163 lbs.  Patient reports compliance with Lasix 80 a.m. 40 p.m, and denies excessive salt in diet. Positive history of sick contacts -grandkids.  Not on home O2.  Patient reports he has been off steroids for at least 5-6 months.  Patient presented to her pulmonologist office today, was found to be hypoxic- 85% on room air, chest x-ray suggesting pneumonia and was "to go to the ED via EMS  Assessment & Plan:    Community acquired pneumonia -Patient presented with productive cough, dyspnea, preceded by URI and positive sick contacts -Chest x-ray with questionable opacities versus edema. Repeat x-ray with edema and developing infiltrate -continue ceftriaxone, azithromycin, IV Solu-Medrol for wheezing and likely sarcoidosis flare -blood cultures negative -Influenza PCR is negative -Needs follow-up chest x-ray in 4-6 weeks  Acute on chronic diastolic CHF -last echo with preserved ejection fraction -improving -continue IV lasix, change to PO lasix tomorrow    Pulmonary sarcoidosis  With acute flare -Stop prednisone, start IV Solu-Medrol today -Monitor clinically -Prednisone taper at discharge -follow-up with pulmonary at [facility]   Right heel likely pressure ulcer -Minimal purulence at the ulcer base, no evidence of surrounding  cellulitis -X-ray negative for osteomyelitis -Wound care consult -Does not even need antibiotics at this time for this  Chronic anemia -Baseline hemoglobin in the 8-9 range per chart review from caregiver -Stable at baseline, follow-up with PCP for further workup  Paroxysmal atrial fibrillation -In sinus rhythm, continue amiodarone beta blocker and Eliquis  Chronic kidney disease stage II-3 -Continue IV Lasix today and restart home dose of diuretics tomorrow  Prolong QTC -Stopped trazodone -Continue amiodarone -Repeat EKG with normal QTc  DVT prophylaxis: Eliquis Code Status: Full Code Family Communication:no family at bedside Disposition Plan: home in 1-2 days  Consultants:      Procedures:   Antimicrobials:    Subjective: -feels better, breathing improving  Objective: Vitals:   08/23/17 0500 08/23/17 0521 08/23/17 0757 08/23/17 1200  BP:  (!) 120/53    Pulse:  77    Resp:  18    Temp:  98.2 F (36.8 C)    TempSrc:  Axillary    SpO2:  100% 100% 92%  Weight: 76.9 kg (169 lb 8.5 oz)     Height:        Intake/Output Summary (Last 24 hours) at 08/23/2017 1353 Last data filed at 08/23/2017 1236 Gross per 24 hour  Intake 1220 ml  Output 800 ml  Net 420 ml   Filed Weights   08/21/17 0500 08/22/17 0622 08/23/17 0500  Weight: 77.8 kg (171 lb 8.3 oz) 76.7 kg (169 lb) 76.9 kg (169 lb 8.5 oz)    Examination:  General exam: Appears calm and comfortable, no distress Respiratory system: . Scattered rhonchi and expiratory wheezes Cardiovascular system: S1 & S2 heard, RRR.  Gastrointestinal system: Abdomen is nondistended, soft and nontender.Normal  bowel sounds heard. Central nervous system: Alert and oriented. No focal neurological deficits. Extremities: Symmetric 5 x 5 power., right heel with ulcer with minimal purulence at the base Skin: right heel ulcer as noted above Psychiatry: Judgement and insight appear normal. Mood & affect appropriate.     Data  Reviewed:   CBC: Recent Labs  Lab 08/20/17 1410 08/22/17 0531 08/23/17 0657  WBC 6.0 6.4 5.1  NEUTROABS  --  4.6  --   HGB 9.6* 8.1* 8.7*  HCT 31.2* 26.4* 27.8*  MCV 75.9* 75.6* 75.3*  PLT 166 147* 124   Basic Metabolic Panel: Recent Labs  Lab 08/20/17 1410 08/21/17 0443 08/22/17 0531 08/23/17 0657  NA 140 140 140 140  K 4.4 4.1 3.3* 3.9  CL 103 101 102 103  CO2 22 24 27 26   GLUCOSE 82 105* 79 171*  BUN 20 15 16  28*  CREATININE 1.50* 1.23* 1.28* 1.39*  CALCIUM 9.6 9.7 8.8* 8.7*   GFR: Estimated Creatinine Clearance: 35.1 mL/min (A) (by C-G formula based on SCr of 1.39 mg/dL (H)). Liver Function Tests: Recent Labs  Lab 08/20/17 1410  AST 42*  ALT 19  ALKPHOS 63  BILITOT 1.2  PROT 7.6  ALBUMIN 3.4*   No results for input(s): LIPASE, AMYLASE in the last 168 hours. No results for input(s): AMMONIA in the last 168 hours. Coagulation Profile: No results for input(s): INR, PROTIME in the last 168 hours. Cardiac Enzymes: Recent Labs  Lab 08/20/17 1434  TROPONINI <0.03   BNP (last 3 results) No results for input(s): PROBNP in the last 8760 hours. HbA1C: No results for input(s): HGBA1C in the last 72 hours. CBG: No results for input(s): GLUCAP in the last 168 hours. Lipid Profile: No results for input(s): CHOL, HDL, LDLCALC, TRIG, CHOLHDL, LDLDIRECT in the last 72 hours. Thyroid Function Tests: No results for input(s): TSH, T4TOTAL, FREET4, T3FREE, THYROIDAB in the last 72 hours. Anemia Panel: No results for input(s): VITAMINB12, FOLATE, FERRITIN, TIBC, IRON, RETICCTPCT in the last 72 hours. Urine analysis: No results found for: COLORURINE, APPEARANCEUR, LABSPEC, PHURINE, GLUCOSEU, HGBUR, BILIRUBINUR, KETONESUR, PROTEINUR, UROBILINOGEN, NITRITE, LEUKOCYTESUR Sepsis Labs: @LABRCNTIP (procalcitonin:4,lacticidven:4)  ) Recent Results (from the past 240 hour(s))  Blood culture (routine x 2)     Status: None (Preliminary result)   Collection Time: 08/20/17   3:21 PM  Result Value Ref Range Status   Specimen Description BLOOD RIGHT ANTECUBITAL  Final   Special Requests   Final    BOTTLES DRAWN AEROBIC AND ANAEROBIC Blood Culture adequate volume   Culture   Final    NO GROWTH 2 DAYS Performed at [facility], [address], [place], [place] [address]    Report Status PENDING  Incomplete  Blood culture (routine x 2)     Status: None (Preliminary result)   Collection Time: 08/20/17  4:00 PM  Result Value Ref Range Status   Specimen Description BLOOD LEFT ANTECUBITAL  Final   Special Requests   Final    BOTTLES DRAWN AEROBIC AND ANAEROBIC Blood Culture adequate volume   Culture   Final    NO GROWTH 2 DAYS Performed at [facility], [place] [address], [place], [place] [address]    Report Status PENDING  Incomplete         Radiology Studies: Dg Chest 2 View  Result Date: 08/22/2017 CLINICAL DATA:  Hypoxia EXAM: CHEST - 2 VIEW COMPARISON:  08/20/2017 FINDINGS: Cardiac shadow remains enlarged. Central vascular congestion is noted with interstitial  changes. Some evolving density is noted in the left mid lung when compared with the prior exam which may represent focal infiltrate superimposed on the changes of interstitial edema. Multiple calcified nodes are noted consistent with prior granulomatous disease. IMPRESSION: Changes of CHF with interstitial edema. Developing left mid lung infiltrate is noted projecting in the superior segment of the left lower lobe. Electronically Signed   By: Inez Catalina M.D.   On: 08/22/2017 10:09        Scheduled Meds: . allopurinol  200 mg Oral Daily  . amiodarone  200 mg Oral Daily  . apixaban  5 mg Oral BID  . aspirin EC  81 mg Oral Daily  . atorvastatin  80 mg Oral QHS  . azithromycin  500 mg Oral Daily  . collagenase   Topical Daily  . ipratropium-albuterol  3 mL Nebulization QID  . methylPREDNISolone (SOLU-MEDROL) injection  60 mg Intravenous Q12H  . metoprolol succinate  50 mg Oral  Daily  . mometasone-formoterol  2 puff Inhalation BID  . NIFEdipine  90 mg Oral Daily  . pantoprazole  40 mg Oral Daily  . potassium chloride SA  20 mEq Oral Daily   Continuous Infusions: . cefTRIAXone (ROCEPHIN)  IV Stopped (08/22/17 1530)     LOS: 3 days    Time spent: 10min    Domenic Polite, MD [facility] Page via [email or web address], password [email or web address] After 7PM please contact night-coverage  08/23/2017, 1:53 PM

## 2017-08-24 MED ORDER — PREDNISONE 20 MG PO TABS: 20.0000 mg | ORAL_TABLET | Freq: Every day | ORAL | 0 refills | Status: DC

## 2017-08-24 MED ORDER — CEFPODOXIME PROXETIL 100 MG PO TABS: 100.0000 mg | ORAL_TABLET | Freq: Two times a day (BID) | ORAL | 0 refills | Status: DC

## 2017-08-24 MED ORDER — AZITHROMYCIN 250 MG PO TABS: 250.0000 mg | ORAL_TABLET | Freq: Every day | ORAL | 0 refills | Status: DC

## 2017-08-24 MED ORDER — IPRATROPIUM-ALBUTEROL 0.5-2.5 (3) MG/3ML IN SOLN: 3.0000 mL | Freq: Two times a day (BID) | RESPIRATORY_TRACT | Status: DC

## 2017-08-24 NOTE — Progress Notes (Addendum)
SATURATION QUALIFICATIONS: (This note is used to comply with regulatory documentation for home oxygen)  Patient Saturations on Room Air at Rest = 92%  Patient Saturations on Room Air while Ambulating = 87%    Please briefly explain why patient needs home oxygen: Pt ambulated 150 feet on RA with desat to 87%. Pt symptomatic with notable fatigue and c/o SOB.   Pt was ambulated on O2 to recover from [place]. 2 liters at 94%.

## 2017-08-24 NOTE — Care Management (Signed)
SATURATION QUALIFICATIONS: (This note is used to comply with regulatory documentation for home oxygen)  Patient Saturations on Room Air at Rest = 92  Patient Saturations on Room Air while Ambulating = 87  Patient Saturations on 2l  Liters of oxygen while Ambulating = 94  Please briefly explain why patient needs home oxygen:CHF

## 2017-08-24 NOTE — Care Management Important Message (Signed)
Important Message  Patient Details  Name: Heather Mckay MRN: [id number] Date of Birth: 04-26-1941   Medicare Important Message Given:       Orbie Pyo 08/24/2017, 2:30 PM

## 2017-08-24 NOTE — Progress Notes (Signed)
Pt up with [employer] this AM, she c/o SOB, her O2 was checked and was at 85%.  Pt told [employer] that she does get SOB after she eats periodically.  Notified MD of findings

## 2017-08-24 NOTE — Progress Notes (Signed)
Pt discharged with her family member, taken by w/c with staff to front doors

## 2017-08-24 NOTE — Progress Notes (Signed)
SATURATION QUALIFICATIONS: (This note is used to comply with regulatory documentation for home oxygen)  Patient Saturations on Room Air at Rest = 92%  Patient Saturations on Room Air while Ambulating = 87%  Patient Saturations on 2 Liters of oxygen while Ambulating = NT  Please briefly explain why patient needs home oxygen: Pt ambulated 150 feet on RA with desat to 87%. Pt symptomatic with notable fatigue and c/o SOB.

## 2017-08-24 NOTE — Progress Notes (Signed)
Discharge instructions reviewed with the Patient, and prescriptions given to the Patient.  Pt's family member to pick her up to take her home.  Pt [facility]. She does have [place] set up to come back out to her home.  Also, Pt has O2 in her room that was delivered to her this afternoon.  PIV was removed prior to her leaving the hospital.

## 2017-08-24 NOTE — Care Management Note (Addendum)
Case Management Note  Patient Details  Name: Heather Mckay MRN: [id number] Date of Birth: [birth year]  Subjective/Objective:   Home oxygen has been approved [facility] will bring portable tank to room. DC summary faxed to [facility] at [facility].                 Action/Plan:saturation note incorrect. Patient did not ambulate on oxygen . Paged PT x 2 , no call back. Bedside nurse aware, patient needs to ambulate on oxygen , flow rate and saturation needs to be documented.   Patient already active with [facility] health of [place] [phone] and wants to continue with them. Spoke with Erline Levine at [place] and faxed updated information and order.   Patient also active with [facility] , [address] [place], [place] [address]. Patient has appointment there Wednesday August 29, 2017 at 2:45 pm .  Asked nurse to document home oxygen qualifying saturation note. Once completed will order home oxygen through [facility].  Patient voiced understanding to all of above.   Expected Discharge Date:  08/23/17               Expected Discharge Plan:  [place]  In-House Referral:  NA  Discharge planning Services  CM Consult  Post Acute Care Choice:  Home Health, Durable Medical Equipment Choice offered to:  Patient  DME Arranged:  Oxygen DME Agency:  [place]:  RN [facility] Agency:  [place]  Status of Service:  In process, will continue to follow  If discussed at Long Length of Stay Meetings, dates discussed:    Additional Comments:  Marilu Favre, RN 08/24/2017, 12:12 PM

## 2017-08-24 NOTE — Discharge Summary (Signed)
Physician Discharge Summary  Heather Mckay ONG:[id number] DOB: 1941/03/31 DOA: 08/20/2017  PCP: Dionicia Abler, MD  Admit date: 08/20/2017 Discharge date: 08/24/2017  Time spent: 45 minutes  Recommendations for Outpatient Follow-up:  1. PCP Dr.Willis in 1 week, recommend further workup of chronic microcytic anemia 2. Home health PT/OT/RN 3. FU CXR in 4-6weeks 4. FU weekly at [facility] for chronic heel ulcer   Discharge Diagnoses:  Active Problems:   PNA (pneumonia)   Acute on Chronic diastolic CHF (congestive heart failure) (HCC)   Pulmonary sarcoidosis (HCC)   Pulmonary hypertension (HCC)   Chronic heel ulcer   Chronic microcytic anemia  Discharge Condition: stable  Diet recommendation: low sodium  Filed Weights   08/22/17 0622 08/23/17 0500 08/24/17 0437  Weight: 76.7 kg (169 lb) 76.9 kg (169 lb 8.5 oz) 76.4 kg (168 lb 6.9 oz)    History of present illness:  Heather Mckay is a 77 y.o. femalewith medical history- per care everywhere, significant for- pulmonary sarcoidosis, pulmonary hypertension, atrial fibrillation, OSA, HTN, DM2- ? Secondary to steroid use, asthma, who presented to the ED complaints of shortness of breath and cough of 2 days duration.  Mild Chronic and symmetric lower extremity swelling over 2 months duration  Hospital Course:   Community acquired pneumonia -Patient presented with productive cough, dyspnea, preceded by URI and positive sick contacts -Chest x-ray with questionable opacities versus edema. Repeat x-ray with edema and developing infiltrate -clinically improved with ceftriaxone, azithromycin she was also given a brief course of steroids for associated wheezing/reactive airway disease -influenza PCR was negative, blood cultures are negative -Transitioned to oral azithromycin and cefpodoxime at discharge -Needs follow-up chest x-ray in 4-6 weeks  Acute on chronic diastolic CHF -last echo with preserved ejection fraction -in addition to  above pneumonia she also had symptoms of ongoing lower extremity edema for couple of months aren't sure what had been compliant with her diuretics however was eating a lot of canned foods and canned soups which were likely causing increased fluid retention -Clinically improved with diuresis she was 6 L negative at discharge, transitioned to home dose of Lasix at the time of discharge and advised to cut down on canned foods and soups    Pulmonary sarcoidosis   -stable, follow up with pulmonary at [facility]   Right heel likely pressure ulcer -Minimal purulence at the ulcer base, no evidence of surrounding cellulitis -X-ray negative for osteomyelitis -Wound care consulted, she is followed weekly at the wound Center for this, does not need antibiotics at this time for her chronic heel wound  Chronic anemia -Baseline hemoglobin in the 8-9 range per chart review from caregiver -Stable at baseline, follow-up with PCP for further workup  Paroxysmal atrial fibrillation -In sinus rhythm, continue amiodarone beta blocker and Eliquis  Chronic kidney disease stage II-3 -stable at baseline  Prolong QTC -resolved -Continue amiodarone -Repeat EKG with normal QTc  Consultations:  [facility] RN  Discharge Exam: Vitals:   08/24/17 0951 08/24/17 1039  BP:  (!) 134/51  Pulse:  85  Resp:    Temp:    SpO2: 92%     General: AAOx3 Cardiovascular: S1S2/RRR Respiratory: CTAB  Discharge Instructions    Allergies as of 08/24/2017      Reactions   Hydrocodone    Itching   Imdur [isosorbide Dinitrate]    Itching   Percocet [oxycodone-acetaminophen]    Itching      Medication List    STOP taking these medications   celecoxib 100  MG capsule Commonly known as:  CELEBREX     TAKE these medications   allopurinol 100 MG tablet Commonly known as:  ZYLOPRIM Take 200 mg by mouth daily.   amiodarone 200 MG tablet Commonly known as:  PACERONE Take 200 mg by mouth daily.   apixaban 5  MG Tabs tablet Commonly known as:  ELIQUIS Take 5 mg by mouth 2 (two) times daily.   aspirin EC 81 MG tablet Take 81 mg by mouth daily.   atorvastatin 80 MG tablet Commonly known as:  LIPITOR Take 80 mg by mouth at bedtime.   azithromycin 250 MG tablet Commonly known as:  ZITHROMAX Take 1 tablet (250 mg total) by mouth daily. For 3days   CALCIUM PO Take 600 mg by mouth daily.   cefpodoxime 100 MG tablet Commonly known as:  VANTIN Take 1 tablet (100 mg total) by mouth 2 (two) times daily. For 3days   diclofenac sodium 1 % Gel Commonly known as:  VOLTAREN Apply 1 application topically as needed (on her hand).   ferrous sulfate 325 (65 FE) MG tablet Take 1 tablet by mouth daily.   furosemide 40 MG tablet Commonly known as:  LASIX Take 40-80 mg by mouth See admin instructions. Take 40 mg in the morning and 80 mg in the evening   metoprolol succinate 50 MG 24 hr tablet Commonly known as:  TOPROL-XL Take 50 mg by mouth daily.   NIFEdipine 90 MG 24 hr tablet Commonly known as:  ADALAT CC Take 90 mg by mouth daily.   pantoprazole 40 MG tablet Commonly known as:  PROTONIX Take 40 mg by mouth.   potassium chloride SA 20 MEQ tablet Commonly known as:  K-DUR,KLOR-CON Take 20 mEq by mouth daily.   predniSONE 20 MG tablet Commonly known as:  DELTASONE Take 1 tablet (20 mg total) by mouth daily with breakfast. Take 20mg  daily for 2days then STOP Start taking on:  08/25/2017   SANTYL ointment Generic drug:  collagenase Apply 1 application topically as needed.   SYMBICORT 160-4.5 MCG/ACT inhaler Generic drug:  budesonide-formoterol Inhale 2 puffs into the lungs 2 (two) times daily.   tetrahydrozoline 0.05 % ophthalmic solution Place 2 drops into both eyes as needed.   traMADol 50 MG tablet Commonly known as:  ULTRAM Take 50 mg by mouth 2 (two) times daily.   traZODone 50 MG tablet Commonly known as:  DESYREL Take 50 mg by mouth as needed.            Durable  Medical Equipment  (From admission, onward)        Start     Ordered   08/24/17 1006  For home use only DME oxygen  Once    Question Answer Comment  Mode or (Route) Nasal cannula   Liters per Minute 2   Frequency Continuous (stationary and portable oxygen unit needed)   Oxygen conserving device Yes   Oxygen delivery system Gas      08/24/17 1006     Allergies  Allergen Reactions  . Hydrocodone     Itching  . Imdur [Isosorbide Dinitrate]     Itching  . Percocet [Oxycodone-Acetaminophen]     Itching   Follow-up Information    Dionicia Abler, MD. Schedule an appointment as soon as possible for a visit in 1 week(s).   Specialty:  Internal Medicine Contact information: [address] [place] [address] [place] Follow up in  1 week(s).   Why:  Follow up appointment Wednesday August 29, 2017 at 2 45 pm  Contact information: [address] , [place], [place] [address]  Phone [phone]       [facility], [facility] Follow up.   Why:  will provide home health RN [phone] Contact information: [place] [address] [phone]            The results of significant diagnostics from this hospitalization (including imaging, microbiology, ancillary and laboratory) are listed below for reference.    Significant Diagnostic Studies: Dg Chest 2 View  Result Date: 08/22/2017 CLINICAL DATA:  Hypoxia EXAM: CHEST - 2 VIEW COMPARISON:  08/20/2017 FINDINGS: Cardiac shadow remains enlarged. Central vascular congestion is noted with interstitial changes. Some evolving density is noted in the left mid lung when compared with the prior exam which may represent focal infiltrate superimposed on the changes of interstitial edema. Multiple calcified nodes are noted consistent with prior granulomatous disease. IMPRESSION: Changes of CHF with interstitial edema. Developing left mid lung infiltrate is noted projecting in the  superior segment of the left lower lobe. Electronically Signed   By: Inez Catalina M.D.   On: 08/22/2017 10:09   Dg Chest 2 View  Result Date: 08/20/2017 CLINICAL DATA:  Two days of shortness of breath and productive cough. History of asthma, COPD, gastroesophageal reflux. EXAM: CHEST - 2 VIEW COMPARISON:  None in PACs FINDINGS: The lungs are well-expanded. The cardiac silhouette is enlarged. The pulmonary vascularity is engorged. There are patchy airspace opacities predominantly in the lower lobes. Small amounts of pleural fluid obscure the costophrenic angles. The bony thorax exhibits no acute abnormality. IMPRESSION: CHF with mild interstitial edema. Patchy airspace opacities in the mid and lower lungs may reflect pulmonary edema or superimposed pneumonia. Electronically Signed   By: David  Martinique M.D.   On: 08/20/2017 13:57   Dg Foot Complete Right  Result Date: 08/20/2017 CLINICAL DATA:  Heel ulcer EXAM: RIGHT FOOT COMPLETE - 3+ VIEW COMPARISON:  None. FINDINGS: Soft tissue defect is noted consistent with the given clinical history. No underlying bony destruction to suggest osteomyelitis is noted. Mild tarsal degenerative changes are seen as well as calcaneal spurs. Mild vascular calcifications are noted. No acute fracture is seen. IMPRESSION: Soft tissue wound without evidence of osteomyelitis. Mild degenerative change. Electronically Signed   By: Inez Catalina M.D.   On: 08/20/2017 20:40    Microbiology: Recent Results (from the past 240 hour(s))  Blood culture (routine x 2)     Status: None (Preliminary result)   Collection Time: 08/20/17  3:21 PM  Result Value Ref Range Status   Specimen Description BLOOD RIGHT ANTECUBITAL  Final   Special Requests   Final    BOTTLES DRAWN AEROBIC AND ANAEROBIC Blood Culture adequate volume   Culture   Final    NO GROWTH 3 DAYS Performed at [facility], [address], [place], [place] [address]    Report Status PENDING  Incomplete  Blood  culture (routine x 2)     Status: None (Preliminary result)   Collection Time: 08/20/17  4:00 PM  Result Value Ref Range Status   Specimen Description BLOOD LEFT ANTECUBITAL  Final   Special Requests   Final    BOTTLES DRAWN AEROBIC AND ANAEROBIC Blood Culture adequate volume   Culture   Final    NO GROWTH 3 DAYS Performed at [facility], [place]  [address] [place], [place] [address]    Report Status PENDING  Incomplete     Labs: Basic Metabolic Panel: Recent Labs  Lab 08/20/17 1410 08/21/17 0443 08/22/17 0531 08/23/17 0657  NA 140 140 140 140  K 4.4 4.1 3.3* 3.9  CL 103 101 102 103  CO2 22 24 27 26   GLUCOSE 82 105* 79 171*  BUN 20 15 16  28*  CREATININE 1.50* 1.23* 1.28* 1.39*  CALCIUM 9.6 9.7 8.8* 8.7*   Liver Function Tests: Recent Labs  Lab 08/20/17 1410  AST 42*  ALT 19  ALKPHOS 63  BILITOT 1.2  PROT 7.6  ALBUMIN 3.4*   No results for input(s): LIPASE, AMYLASE in the last 168 hours. No results for input(s): AMMONIA in the last 168 hours. CBC: Recent Labs  Lab 08/20/17 1410 08/22/17 0531 08/23/17 0657  WBC 6.0 6.4 5.1  NEUTROABS  --  4.6  --   HGB 9.6* 8.1* 8.7*  HCT 31.2* 26.4* 27.8*  MCV 75.9* 75.6* 75.3*  PLT 166 147* 160   Cardiac Enzymes: Recent Labs  Lab 08/20/17 1434  TROPONINI <0.03   BNP: BNP (last 3 results) Recent Labs    08/20/17 1433  BNP 386.4*    ProBNP (last 3 results) No results for input(s): PROBNP in the last 8760 hours.  CBG: No results for input(s): GLUCAP in the last 168 hours.     Signed:  Domenic Polite MD.  [facility] 08/24/2017, 1:30 PM

## 2017-08-24 NOTE — Evaluation (Signed)
Physical Therapy Evaluation Patient Details Name: Heather Mckay MRN: [id number] DOB: 07/19/1940 Today's Date: 08/24/2017   History of Present Illness  pt is a 77 y.o. female with medical history significant for pulmonary sarcoidosis, pulmonary hypertension, atrial fibrillation, OSA, HTN, and DM2.  She presented to the ED with complaints of shortness of breath and cough. She was admitted with dx of PNA.     Clinical Impression  Pt admitted with above diagnosis. Pt currently with functional limitations due to the deficits listed below (see PT Problem List). On eval, pt required min guard assist bed mobility, transfers and ambulation HHA 200 feet. Desat to 87% during ambulation on RA. Pt will benefit from skilled PT to increase their independence and safety with mobility to allow discharge to the venue listed below.       Follow Up Recommendations Home health PT;Supervision for mobility/OOB    Equipment Recommendations  None recommended by PT    Recommendations for Other Services       Precautions / Restrictions Precautions Precautions: Other (comment) Precaution Comments: watch sats      Mobility  Bed Mobility Overal bed mobility: Needs Assistance Bed Mobility: Sit to Supine       Sit to supine: Supervision   General bed mobility comments: increased time and effort  Transfers Overall transfer level: Needs assistance Equipment used: None Transfers: Sit to/from Stand Sit to Stand: Min guard         General transfer comment: min guard for safety, increased time to stabilize initial standing balance  Ambulation/Gait Ambulation/Gait assistance: Min guard Ambulation Distance (Feet): 200 Feet Assistive device: 1 person hand held assist Gait Pattern/deviations: Step-through pattern;Decreased stride length Gait velocity: decreased Gait velocity interpretation: Below normal speed for age/gender General Gait Details: SpO2 92% on RA at rest prior to mobility. Pt desat to 87%  ambulating on RA. Upon return to room, O2 91% at rest.  Stairs            Wheelchair Mobility    Modified Rankin (Stroke Patients Only)       Balance Overall balance assessment: Mild deficits observed, not formally tested                                           Pertinent Vitals/Pain Pain Assessment: No/denies pain    Home Living Family/patient expects to be discharged to:: Private residence Living Arrangements: Children Available Help at Discharge: Family;Available 24 hours/day Type of Home: House Home Access: Stairs to enter Entrance Stairs-Rails: Right;Left;Can reach both Entrance Stairs-Number of Steps: 2 Home Layout: One level Home Equipment: Bedside commode      Prior Function Level of Independence: Independent         Comments: Pt takes sponge baths.      Hand Dominance        Extremity/Trunk Assessment   Upper Extremity Assessment Upper Extremity Assessment: Generalized weakness    Lower Extremity Assessment Lower Extremity Assessment: Generalized weakness    Cervical / Trunk Assessment Cervical / Trunk Assessment: Normal  Communication      Cognition Arousal/Alertness: Awake/alert Behavior During Therapy: WFL for tasks assessed/performed Overall Cognitive Status: Within Functional Limits for tasks assessed  General Comments      Exercises     Assessment/Plan    PT Assessment Patient needs continued PT services  PT Problem List Decreased strength;Decreased safety awareness;Decreased activity tolerance;Cardiopulmonary status limiting activity;Decreased balance       PT Treatment Interventions Therapeutic activities;Gait training;Therapeutic exercise;Patient/family education;Balance training;Functional mobility training;Stair training    PT Goals (Current goals can be found in the Care Plan section)  Acute Rehab PT Goals Patient Stated Goal: home PT  Goal Formulation: With patient Time For Goal Achievement: 09/07/17 Potential to Achieve Goals: Good    Frequency Min 3X/week   Barriers to discharge        Co-evaluation               AM-PAC PT "6 Clicks" Daily Activity  Outcome Measure Difficulty turning over in bed (including adjusting bedclothes, sheets and blankets)?: A Little Difficulty moving from lying on back to sitting on the side of the bed? : A Little Difficulty sitting down on and standing up from a chair with arms (e.g., wheelchair, bedside commode, etc,.)?: A Little Help needed moving to and from a bed to chair (including a wheelchair)?: A Little Help needed walking in hospital room?: A Little Help needed climbing 3-5 steps with a railing? : A Little 6 Click Score: 18    End of Session Equipment Utilized During Treatment: Gait belt Activity Tolerance: Patient tolerated treatment well Patient left: in bed;with call bell/phone within reach Nurse Communication: Mobility status PT Visit Diagnosis: Difficulty in walking, not elsewhere classified (R26.2);Muscle weakness (generalized) (M62.81)    Time: 1694-5038 PT Time Calculation (min) (ACUTE ONLY): 16 min   Charges:   PT Evaluation $PT Eval Moderate Complexity: 1 Mod     PT G Codes:        Lorrin Goodell, PT  Office # [phone] Pager [phone]   Lorriane Shire 08/24/2017, 12:46 PM

## 2017-08-25 LAB — CULTURE, BLOOD (ROUTINE X 2)
Culture: NO GROWTH
Culture: NO GROWTH
SPECIAL REQUESTS: ADEQUATE
Special Requests: ADEQUATE

## 2018-03-24 ENCOUNTER — Emergency Department (HOSPITAL_COMMUNITY): Payer: Medicare Other

## 2018-03-24 ENCOUNTER — Other Ambulatory Visit: Payer: Self-pay

## 2018-03-24 ENCOUNTER — Encounter (HOSPITAL_COMMUNITY): Payer: Self-pay | Admitting: Emergency Medicine

## 2018-03-24 ENCOUNTER — Inpatient Hospital Stay (HOSPITAL_COMMUNITY)
Admission: EM | Admit: 2018-03-24 | Discharge: 2018-05-05 | DRG: 870 | Disposition: E | Payer: Medicare Other | Attending: Pulmonary Disease | Admitting: Pulmonary Disease

## 2018-03-24 DIAGNOSIS — L8990 Pressure ulcer of unspecified site, unspecified stage: Secondary | ICD-10-CM

## 2018-03-24 DIAGNOSIS — K7200 Acute and subacute hepatic failure without coma: Secondary | ICD-10-CM | POA: Diagnosis not present

## 2018-03-24 DIAGNOSIS — Z9911 Dependence on respirator [ventilator] status: Secondary | ICD-10-CM

## 2018-03-24 DIAGNOSIS — R945 Abnormal results of liver function studies: Secondary | ICD-10-CM | POA: Diagnosis not present

## 2018-03-24 DIAGNOSIS — E878 Other disorders of electrolyte and fluid balance, not elsewhere classified: Secondary | ICD-10-CM | POA: Diagnosis not present

## 2018-03-24 DIAGNOSIS — N179 Acute kidney failure, unspecified: Secondary | ICD-10-CM | POA: Diagnosis not present

## 2018-03-24 DIAGNOSIS — E1122 Type 2 diabetes mellitus with diabetic chronic kidney disease: Secondary | ICD-10-CM | POA: Diagnosis present

## 2018-03-24 DIAGNOSIS — R740 Nonspecific elevation of levels of transaminase and lactic acid dehydrogenase [LDH]: Secondary | ICD-10-CM

## 2018-03-24 DIAGNOSIS — R0602 Shortness of breath: Secondary | ICD-10-CM

## 2018-03-24 DIAGNOSIS — D6489 Other specified anemias: Secondary | ICD-10-CM | POA: Diagnosis present

## 2018-03-24 DIAGNOSIS — N183 Chronic kidney disease, stage 3 (moderate): Secondary | ICD-10-CM | POA: Diagnosis present

## 2018-03-24 DIAGNOSIS — I10 Essential (primary) hypertension: Secondary | ICD-10-CM | POA: Diagnosis present

## 2018-03-24 DIAGNOSIS — I82621 Acute embolism and thrombosis of deep veins of right upper extremity: Secondary | ICD-10-CM | POA: Diagnosis not present

## 2018-03-24 DIAGNOSIS — R7401 Elevation of levels of liver transaminase levels: Secondary | ICD-10-CM

## 2018-03-24 DIAGNOSIS — Z7951 Long term (current) use of inhaled steroids: Secondary | ICD-10-CM

## 2018-03-24 DIAGNOSIS — J80 Acute respiratory distress syndrome: Secondary | ICD-10-CM | POA: Diagnosis not present

## 2018-03-24 DIAGNOSIS — Z4659 Encounter for fitting and adjustment of other gastrointestinal appliance and device: Secondary | ICD-10-CM

## 2018-03-24 DIAGNOSIS — J9620 Acute and chronic respiratory failure, unspecified whether with hypoxia or hypercapnia: Secondary | ICD-10-CM

## 2018-03-24 DIAGNOSIS — R9431 Abnormal electrocardiogram [ECG] [EKG]: Secondary | ICD-10-CM | POA: Diagnosis not present

## 2018-03-24 DIAGNOSIS — E876 Hypokalemia: Secondary | ICD-10-CM | POA: Diagnosis not present

## 2018-03-24 DIAGNOSIS — I5032 Chronic diastolic (congestive) heart failure: Secondary | ICD-10-CM | POA: Diagnosis present

## 2018-03-24 DIAGNOSIS — E785 Hyperlipidemia, unspecified: Secondary | ICD-10-CM | POA: Diagnosis present

## 2018-03-24 DIAGNOSIS — G4733 Obstructive sleep apnea (adult) (pediatric): Secondary | ICD-10-CM | POA: Diagnosis present

## 2018-03-24 DIAGNOSIS — I469 Cardiac arrest, cause unspecified: Secondary | ICD-10-CM

## 2018-03-24 DIAGNOSIS — K219 Gastro-esophageal reflux disease without esophagitis: Secondary | ICD-10-CM | POA: Diagnosis present

## 2018-03-24 DIAGNOSIS — Z9981 Dependence on supplemental oxygen: Secondary | ICD-10-CM | POA: Diagnosis not present

## 2018-03-24 DIAGNOSIS — E43 Unspecified severe protein-calorie malnutrition: Secondary | ICD-10-CM | POA: Diagnosis not present

## 2018-03-24 DIAGNOSIS — I480 Paroxysmal atrial fibrillation: Secondary | ICD-10-CM | POA: Diagnosis present

## 2018-03-24 DIAGNOSIS — M109 Gout, unspecified: Secondary | ICD-10-CM | POA: Diagnosis present

## 2018-03-24 DIAGNOSIS — J9621 Acute and chronic respiratory failure with hypoxia: Secondary | ICD-10-CM | POA: Diagnosis present

## 2018-03-24 DIAGNOSIS — I2729 Other secondary pulmonary hypertension: Secondary | ICD-10-CM | POA: Diagnosis present

## 2018-03-24 DIAGNOSIS — N189 Chronic kidney disease, unspecified: Secondary | ICD-10-CM

## 2018-03-24 DIAGNOSIS — D696 Thrombocytopenia, unspecified: Secondary | ICD-10-CM | POA: Diagnosis present

## 2018-03-24 DIAGNOSIS — J9601 Acute respiratory failure with hypoxia: Secondary | ICD-10-CM

## 2018-03-24 DIAGNOSIS — J189 Pneumonia, unspecified organism: Secondary | ICD-10-CM | POA: Diagnosis not present

## 2018-03-24 DIAGNOSIS — J123 Human metapneumovirus pneumonia: Secondary | ICD-10-CM | POA: Diagnosis present

## 2018-03-24 DIAGNOSIS — D860 Sarcoidosis of lung: Secondary | ICD-10-CM | POA: Diagnosis present

## 2018-03-24 DIAGNOSIS — Z7901 Long term (current) use of anticoagulants: Secondary | ICD-10-CM

## 2018-03-24 DIAGNOSIS — J440 Chronic obstructive pulmonary disease with acute lower respiratory infection: Secondary | ICD-10-CM | POA: Diagnosis present

## 2018-03-24 DIAGNOSIS — I351 Nonrheumatic aortic (valve) insufficiency: Secondary | ICD-10-CM | POA: Diagnosis not present

## 2018-03-24 DIAGNOSIS — R7989 Other specified abnormal findings of blood chemistry: Secondary | ICD-10-CM

## 2018-03-24 DIAGNOSIS — G92 Toxic encephalopathy: Secondary | ICD-10-CM | POA: Diagnosis not present

## 2018-03-24 DIAGNOSIS — K5900 Constipation, unspecified: Secondary | ICD-10-CM | POA: Diagnosis not present

## 2018-03-24 DIAGNOSIS — A419 Sepsis, unspecified organism: Secondary | ICD-10-CM | POA: Diagnosis not present

## 2018-03-24 DIAGNOSIS — E663 Overweight: Secondary | ICD-10-CM | POA: Diagnosis present

## 2018-03-24 DIAGNOSIS — L89150 Pressure ulcer of sacral region, unstageable: Secondary | ICD-10-CM | POA: Diagnosis not present

## 2018-03-24 DIAGNOSIS — R34 Anuria and oliguria: Secondary | ICD-10-CM | POA: Diagnosis not present

## 2018-03-24 DIAGNOSIS — E860 Dehydration: Secondary | ICD-10-CM | POA: Diagnosis present

## 2018-03-24 DIAGNOSIS — R042 Hemoptysis: Secondary | ICD-10-CM | POA: Diagnosis not present

## 2018-03-24 DIAGNOSIS — Z452 Encounter for adjustment and management of vascular access device: Secondary | ICD-10-CM

## 2018-03-24 DIAGNOSIS — J9600 Acute respiratory failure, unspecified whether with hypoxia or hypercapnia: Secondary | ICD-10-CM

## 2018-03-24 DIAGNOSIS — R6521 Severe sepsis with septic shock: Secondary | ICD-10-CM | POA: Diagnosis not present

## 2018-03-24 DIAGNOSIS — E870 Hyperosmolality and hypernatremia: Secondary | ICD-10-CM | POA: Diagnosis not present

## 2018-03-24 DIAGNOSIS — I130 Hypertensive heart and chronic kidney disease with heart failure and stage 1 through stage 4 chronic kidney disease, or unspecified chronic kidney disease: Secondary | ICD-10-CM | POA: Diagnosis present

## 2018-03-24 DIAGNOSIS — R40243 Glasgow coma scale score 3-8, unspecified time: Secondary | ICD-10-CM

## 2018-03-24 DIAGNOSIS — E1165 Type 2 diabetes mellitus with hyperglycemia: Secondary | ICD-10-CM | POA: Diagnosis not present

## 2018-03-24 DIAGNOSIS — M7989 Other specified soft tissue disorders: Secondary | ICD-10-CM | POA: Diagnosis not present

## 2018-03-24 DIAGNOSIS — K761 Chronic passive congestion of liver: Secondary | ICD-10-CM | POA: Diagnosis present

## 2018-03-24 DIAGNOSIS — Z6827 Body mass index (BMI) 27.0-27.9, adult: Secondary | ICD-10-CM

## 2018-03-24 DIAGNOSIS — D472 Monoclonal gammopathy: Secondary | ICD-10-CM | POA: Diagnosis present

## 2018-03-24 DIAGNOSIS — M3500 Sicca syndrome, unspecified: Secondary | ICD-10-CM | POA: Diagnosis present

## 2018-03-24 DIAGNOSIS — Z7982 Long term (current) use of aspirin: Secondary | ICD-10-CM

## 2018-03-24 DIAGNOSIS — R570 Cardiogenic shock: Secondary | ICD-10-CM | POA: Diagnosis not present

## 2018-03-24 DIAGNOSIS — Z853 Personal history of malignant neoplasm of breast: Secondary | ICD-10-CM

## 2018-03-24 DIAGNOSIS — R680 Hypothermia, not associated with low environmental temperature: Secondary | ICD-10-CM | POA: Diagnosis not present

## 2018-03-24 DIAGNOSIS — F05 Delirium due to known physiological condition: Secondary | ICD-10-CM | POA: Diagnosis not present

## 2018-03-24 DIAGNOSIS — A4189 Other specified sepsis: Secondary | ICD-10-CM | POA: Diagnosis present

## 2018-03-24 DIAGNOSIS — D869 Sarcoidosis, unspecified: Secondary | ICD-10-CM | POA: Diagnosis not present

## 2018-03-24 LAB — RESPIRATORY PANEL BY PCR
Adenovirus: NOT DETECTED
BORDETELLA PERTUSSIS-RVPCR: NOT DETECTED
CORONAVIRUS 229E-RVPPCR: NOT DETECTED
CORONAVIRUS HKU1-RVPPCR: NOT DETECTED
CORONAVIRUS OC43-RVPPCR: NOT DETECTED
Chlamydophila pneumoniae: NOT DETECTED
Coronavirus NL63: NOT DETECTED
Influenza A: NOT DETECTED
Influenza B: NOT DETECTED
METAPNEUMOVIRUS-RVPPCR: DETECTED — AB
Mycoplasma pneumoniae: NOT DETECTED
PARAINFLUENZA VIRUS 1-RVPPCR: NOT DETECTED
PARAINFLUENZA VIRUS 2-RVPPCR: NOT DETECTED
Parainfluenza Virus 3: NOT DETECTED
Parainfluenza Virus 4: NOT DETECTED
Respiratory Syncytial Virus: NOT DETECTED
Rhinovirus / Enterovirus: NOT DETECTED

## 2018-03-24 LAB — I-STAT CG4 LACTIC ACID, ED
LACTIC ACID, VENOUS: 2.32 mmol/L — AB (ref 0.5–1.9)
Lactic Acid, Venous: 1.41 mmol/L (ref 0.5–1.9)

## 2018-03-24 LAB — CBC WITH DIFFERENTIAL/PLATELET
Abs Immature Granulocytes: 0.04 10*3/uL (ref 0.00–0.07)
BASOS ABS: 0 10*3/uL (ref 0.0–0.1)
BASOS PCT: 0 %
Eosinophils Absolute: 0 10*3/uL (ref 0.0–0.5)
Eosinophils Relative: 0 %
HCT: 27.9 % — ABNORMAL LOW (ref 36.0–46.0)
Hemoglobin: 8.6 g/dL — ABNORMAL LOW (ref 12.0–15.0)
IMMATURE GRANULOCYTES: 1 %
Lymphocytes Relative: 12 %
Lymphs Abs: 1 10*3/uL (ref 0.7–4.0)
MCH: 25.8 pg — ABNORMAL LOW (ref 26.0–34.0)
MCHC: 30.8 g/dL (ref 30.0–36.0)
MCV: 83.8 fL (ref 80.0–100.0)
Monocytes Absolute: 0.2 10*3/uL (ref 0.1–1.0)
Monocytes Relative: 2 %
NEUTROS PCT: 85 %
NRBC: 0.6 % — AB (ref 0.0–0.2)
Neutro Abs: 6.8 10*3/uL (ref 1.7–7.7)
PLATELETS: 96 10*3/uL — AB (ref 150–400)
RBC: 3.33 MIL/uL — AB (ref 3.87–5.11)
RDW: 17.8 % — AB (ref 11.5–15.5)
WBC: 8 10*3/uL (ref 4.0–10.5)

## 2018-03-24 LAB — BRAIN NATRIURETIC PEPTIDE: B NATRIURETIC PEPTIDE 5: 573.6 pg/mL — AB (ref 0.0–100.0)

## 2018-03-24 LAB — COMPREHENSIVE METABOLIC PANEL
ALK PHOS: 64 U/L (ref 38–126)
ALT: 211 U/L — ABNORMAL HIGH (ref 0–44)
ANION GAP: 8 (ref 5–15)
AST: 293 U/L — ABNORMAL HIGH (ref 15–41)
Albumin: 3.3 g/dL — ABNORMAL LOW (ref 3.5–5.0)
BILIRUBIN TOTAL: 0.9 mg/dL (ref 0.3–1.2)
BUN: 26 mg/dL — ABNORMAL HIGH (ref 8–23)
CALCIUM: 9.1 mg/dL (ref 8.9–10.3)
CO2: 23 mmol/L (ref 22–32)
Chloride: 104 mmol/L (ref 98–111)
Creatinine, Ser: 1.72 mg/dL — ABNORMAL HIGH (ref 0.44–1.00)
GFR calc non Af Amer: 28 mL/min — ABNORMAL LOW (ref >60)
GFR, EST AFRICAN AMERICAN: 32 mL/min — AB (ref >60)
Glucose, Bld: 61 mg/dL — ABNORMAL LOW (ref 70–99)
Potassium: 4 mmol/L (ref 3.5–5.1)
Sodium: 135 mmol/L (ref 135–145)
TOTAL PROTEIN: 7.7 g/dL (ref 6.5–8.1)

## 2018-03-24 LAB — PROCALCITONIN: PROCALCITONIN: 0.73 ng/mL

## 2018-03-24 MED ORDER — METOPROLOL SUCCINATE ER 50 MG PO TB24
50.0000 mg | ORAL_TABLET | Freq: Every day | ORAL | Status: DC
  Administered 2018-03-24 – 2018-03-25 (×2): 50 mg via ORAL
  Filled 2018-03-24 (×3): qty 1

## 2018-03-24 MED ORDER — NIFEDIPINE ER OSMOTIC RELEASE 90 MG PO TB24
90.0000 mg | ORAL_TABLET | Freq: Every day | ORAL | Status: DC
  Administered 2018-03-24 – 2018-03-25 (×2): 90 mg via ORAL
  Filled 2018-03-24 (×2): qty 1

## 2018-03-24 MED ORDER — SODIUM CHLORIDE 0.9 % IV SOLN
2.0000 g | INTRAVENOUS | Status: DC
  Administered 2018-03-24 – 2018-03-25 (×2): 2 g via INTRAVENOUS
  Filled 2018-03-24 (×2): qty 20

## 2018-03-24 MED ORDER — TRAZODONE HCL 50 MG PO TABS: 50.0000 mg | ORAL_TABLET | Freq: Every evening | ORAL | Status: DC | PRN

## 2018-03-24 MED ORDER — BENZONATATE 100 MG PO CAPS
200.0000 mg | ORAL_CAPSULE | Freq: Three times a day (TID) | ORAL | Status: DC | PRN
  Administered 2018-03-24: 200 mg via ORAL
  Filled 2018-03-24 (×2): qty 2

## 2018-03-24 MED ORDER — PANTOPRAZOLE SODIUM 40 MG PO TBEC
40.0000 mg | DELAYED_RELEASE_TABLET | Freq: Every day | ORAL | Status: DC
  Administered 2018-03-24 – 2018-03-25 (×2): 40 mg via ORAL
  Filled 2018-03-24 (×2): qty 1

## 2018-03-24 MED ORDER — TRAMADOL HCL 50 MG PO TABS
50.0000 mg | ORAL_TABLET | Freq: Two times a day (BID) | ORAL | Status: DC
  Administered 2018-03-24 – 2018-03-25 (×3): 50 mg via ORAL
  Filled 2018-03-24 (×3): qty 1

## 2018-03-24 MED ORDER — FERROUS SULFATE 325 (65 FE) MG PO TABS
325.0000 mg | ORAL_TABLET | Freq: Every day | ORAL | Status: DC
  Administered 2018-03-25: 325 mg via ORAL
  Filled 2018-03-24: qty 1

## 2018-03-24 MED ORDER — ALLOPURINOL 100 MG PO TABS
200.0000 mg | ORAL_TABLET | Freq: Every day | ORAL | Status: DC
  Administered 2018-03-24 – 2018-03-25 (×2): 200 mg via ORAL
  Filled 2018-03-24 (×3): qty 2

## 2018-03-24 MED ORDER — ASPIRIN EC 81 MG PO TBEC
81.0000 mg | DELAYED_RELEASE_TABLET | Freq: Every day | ORAL | Status: DC
  Administered 2018-03-24 – 2018-03-25 (×2): 81 mg via ORAL
  Filled 2018-03-24 (×2): qty 1

## 2018-03-24 MED ORDER — SODIUM CHLORIDE 0.9 % IV BOLUS (SEPSIS)
1000.0000 mL | Freq: Once | INTRAVENOUS | Status: AC
  Administered 2018-03-24: 1000 mL via INTRAVENOUS

## 2018-03-24 MED ORDER — MOMETASONE FURO-FORMOTEROL FUM 200-5 MCG/ACT IN AERO
2.0000 | INHALATION_SPRAY | Freq: Two times a day (BID) | RESPIRATORY_TRACT | Status: DC
  Administered 2018-03-25: 2 via RESPIRATORY_TRACT
  Filled 2018-03-24: qty 8.8

## 2018-03-24 MED ORDER — APIXABAN 5 MG PO TABS
5.0000 mg | ORAL_TABLET | Freq: Two times a day (BID) | ORAL | Status: DC
  Administered 2018-03-24 – 2018-03-26 (×5): 5 mg via ORAL
  Filled 2018-03-24 (×7): qty 1

## 2018-03-24 MED ORDER — SODIUM CHLORIDE 0.9 % IV SOLN
500.0000 mg | INTRAVENOUS | Status: DC
  Administered 2018-03-24 – 2018-03-25 (×2): 500 mg via INTRAVENOUS
  Filled 2018-03-24 (×2): qty 500

## 2018-03-24 MED ORDER — SODIUM CHLORIDE 0.9 % IV SOLN
INTRAVENOUS | Status: DC
  Administered 2018-03-24 – 2018-04-12 (×7): via INTRAVENOUS

## 2018-03-24 MED ORDER — AMIODARONE HCL 200 MG PO TABS
200.0000 mg | ORAL_TABLET | Freq: Every day | ORAL | Status: DC
  Administered 2018-03-24 – 2018-03-25 (×2): 200 mg via ORAL
  Filled 2018-03-24 (×2): qty 1

## 2018-03-24 MED ORDER — ACETAMINOPHEN 325 MG PO TABS
650.0000 mg | ORAL_TABLET | Freq: Once | ORAL | Status: AC | PRN
  Administered 2018-03-24: 650 mg via ORAL
  Filled 2018-03-24: qty 2

## 2018-03-24 MED ORDER — PREDNISONE 20 MG PO TABS
20.0000 mg | ORAL_TABLET | Freq: Every day | ORAL | Status: DC
  Administered 2018-03-24 – 2018-03-25 (×2): 20 mg via ORAL
  Filled 2018-03-24 (×2): qty 1

## 2018-03-24 MED ORDER — ATORVASTATIN CALCIUM 80 MG PO TABS
80.0000 mg | ORAL_TABLET | Freq: Every day | ORAL | Status: DC
  Administered 2018-03-24 – 2018-03-25 (×2): 80 mg via ORAL
  Filled 2018-03-24 (×2): qty 1

## 2018-03-24 NOTE — Progress Notes (Signed)
Heather Mckay is a 77 y.o. female patient admitted from ED awake, alert - oriented  X 4 - no acute distress noted.  VSS - Blood pressure 135/61, pulse 75, temperature 99.1 F (37.3 C), temperature source Oral, resp. rate 18, height 5\' 4"  (1.626 m), weight 67.2 kg, SpO2 97 %.    IV in place, occlusive dsg intact without redness.  Orientation to room, and floor completed with information packet given to patient/family.  Patient declined safety video at this time.  Admission INP armband ID verified with patient/family, and in place.   SR up x 2, fall assessment complete, with patient and family able to verbalize understanding of risk associated with falls, and verbalized understanding to call nsg before up out of bed.  Call light within reach, patient able to voice, and demonstrate understanding.  Skin, clean-dry- intact without evidence of bruising, or skin tears.   No evidence of skin break down noted on exam.     Will cont to eval and treat per MD orders.  Holley Raring, RN 03/10/2018 4:25 PM

## 2018-03-24 NOTE — H&P (Signed)
History and Physical    Heather Mckay BHA:[id number] DOB: [birth year] DOA: 03/07/2018  PCP: Dionicia Abler, MD Consultants:  [facility] - pulmonology; [facility] - ENT or cancer Patient coming from:  Home - lives with son; NOK: Jeneen Rinks, home phone   Chief Complaint: Cough, fever  HPI: Heather Mckay is a 77 y.o. female with medical history significant of HTN; HLD; pulmonary sarcoidosis; afib on Eliquis; OSA; and diastolic CHF presenting with cough and fever. She reports cough, unable to rest last night.  She had a subjective fever.  She has been coughing for about a week.  Her son has also had a cough and cold.  Cough is productive of "soap suds" sputum.  ED Course:   Multifocal PNA - URI symptoms x 1 week.  No weight changes.  Given 1 L of IVF with improvement in lactic acid.  Getting Rocephin and Azithromycin.  Mildly increased LFTs.  Mildly hypoxic with sats 89%, placed on [place] O2.  Review of Systems: As per HPI; otherwise review of systems reviewed and negative.   Ambulatory Status:  Ambulates without assistance  Past Medical History:  Diagnosis Date  . Asthma   . CHF (congestive heart failure) ([place])   . GERD (gastroesophageal reflux disease)   . HLD (hyperlipidemia)   . Hypertension     Past Surgical History:  Procedure Laterality Date  . ABDOMINAL HYSTERECTOMY    . BREAST LUMPECTOMY Right     Social History   Socioeconomic History  . Marital status: Widowed    Spouse name: Not on file  . Number of children: Not on file  . Years of education: Not on file  . Highest education level: Not on file  Occupational History  . Occupation: retired  Scientific laboratory technician  . Financial resource strain: Not on file  . Food insecurity:    Worry: Not on file    Inability: Not on file  . Transportation needs:    Medical: Not on file    Non-medical: Not on file  Tobacco Use  . Smoking status: Never Smoker  . Smokeless tobacco: Never Used  Substance and Sexual Activity  . Alcohol use: Not  Currently    Frequency: Never  . Drug use: No  . Sexual activity: Not on file  Lifestyle  . Physical activity:    Days per week: Not on file    Minutes per session: Not on file  . Stress: Not on file  Relationships  . Social connections:    Talks on phone: Not on file    Gets together: Not on file    Attends religious service: Not on file    Active member of club or organization: Not on file    Attends meetings of clubs or organizations: Not on file    Relationship status: Not on file  . Intimate partner violence:    Fear of current or ex partner: Not on file    Emotionally abused: Not on file    Physically abused: Not on file    Forced sexual activity: Not on file  Other Topics Concern  . Not on file  Social History Narrative  . Not on file    Allergies  Allergen Reactions  . Hydroxychloroquine Swelling    Patient reported increased swelling of her thyroid gland area. But patient has thyromegaly. Unclear if it is really from Plaquenil or not.  . Hydrocodone     Itching  . Imdur [Isosorbide Dinitrate]     Itching  . Percocet [  Oxycodone-Acetaminophen]     Itching    History reviewed. No pertinent family history.  Prior to Admission medications   Medication Sig Start Date End Date Taking? Authorizing Provider  allopurinol (ZYLOPRIM) 100 MG tablet Take 200 mg by mouth daily. 06/02/17  Yes [provider]  amiodarone (PACERONE) 200 MG tablet Take 200 mg by mouth daily. 08/14/17  Yes [provider]  apixaban (ELIQUIS) 5 MG TABS tablet Take 5 mg by mouth 2 (two) times daily. 02/13/17  Yes [provider]  aspirin EC 81 MG tablet Take 81 mg by mouth daily. 08/08/11  Yes [provider]  atorvastatin (LIPITOR) 80 MG tablet Take 80 mg by mouth at bedtime. 11/14/16  Yes [provider]  CALCIUM PO Take 600 mg by mouth daily. 08/17/11  Yes [provider]  diclofenac sodium (VOLTAREN) 1 % GEL Apply 1 application topically as  needed (on her hand).  08/06/17  Yes [provider]  ferrous sulfate 325 (65 FE) MG tablet Take 1 tablet by mouth daily. 05/24/17 05/24/18 Yes [provider]  furosemide (LASIX) 40 MG tablet Take 40-80 mg by mouth See admin instructions. Take 80 mg in the morning and 40 mg in the evening 08/18/17  Yes [provider]  metoprolol succinate (TOPROL-XL) 50 MG 24 hr tablet Take 50 mg by mouth daily. 07/26/17  Yes [provider]  NIFEdipine (ADALAT CC) 90 MG 24 hr tablet Take 90 mg by mouth daily. 06/07/17  Yes [provider]  pantoprazole (PROTONIX) 40 MG tablet Take 40 mg by mouth. 09/20/16  Yes [provider]  potassium chloride SA (K-DUR,KLOR-CON) 20 MEQ tablet Take 20 mEq by mouth daily. 05/24/17 05/24/18 Yes [provider]  ramelteon (ROZEREM) 8 MG tablet Take 8 mg by mouth at bedtime as needed. 03/02/18  Yes [provider]  SANTYL ointment Apply 1 application topically as needed. 06/26/17  Yes [provider]  SYMBICORT 160-4.5 MCG/ACT inhaler Inhale 2 puffs into the lungs 2 (two) times daily. 06/01/17  Yes [provider]  tetrahydrozoline 0.05 % ophthalmic solution Place 2 drops into both eyes as needed.   Yes [provider]  traMADol (ULTRAM) 50 MG tablet Take 50 mg by mouth 2 (two) times daily. 08/15/17  Yes [provider]  traZODone (DESYREL) 50 MG tablet Take 50 mg by mouth at bedtime as needed for sleep.  05/24/17  Yes [provider]  azithromycin (ZITHROMAX) 250 MG tablet Take 1 tablet (250 mg total) by mouth daily. For 3days Patient not taking: Reported on 03/17/2018 08/24/17   Heather Polite, MD  cefpodoxime (VANTIN) 100 MG tablet Take 1 tablet (100 mg total) by mouth 2 (two) times daily. For 3days Patient not taking: Reported on 03/20/2018 08/24/17   Heather Polite, MD  predniSONE (DELTASONE) 20 MG tablet Take 1 tablet (20 mg total) by mouth daily with breakfast. Take  20mg  daily for 2days then STOP Patient not taking: Reported on 03/14/2018 08/25/17   Heather Polite, MD    Physical Exam: Vitals:   04/04/2018 1300 03/15/2018 1406 03/18/2018 1430 03/23/2018 1500  BP: 136/72 (!) 135/53 137/64 (!) 138/58  Pulse: 79 75 75 77  Resp: (!) 25 14 (!) 21 13  Temp:      TempSrc:      SpO2: 94% 100% 97% 96%  Weight:      Height:         General:  Appears calm and comfortable and is NAD; she is mildly  ill-appearing Eyes:  PERRL, EOMI, normal lids, iris ENT:  grossly normal hearing, lips & tongue, mmm Neck:  no LAD, masses or thyromegaly Cardiovascular:  RRR, no r/g, 3/6 systolic murmur - difficult to auscultate due to diffuse rhonchi. No LE edema.  Respiratory: Diffuse rhonchi scattered throughout lung fields.  Normal respiratory effort. Abdomen:  soft, NT, ND, NABS Skin:  no rash or induration seen on limited exam Musculoskeletal:  grossly normal tone BUE/BLE, good ROM, no bony abnormality Psychiatric:  grossly normal mood and affect, speech fluent and appropriate, AOx3 Neurologic:  CN 2-12 grossly intact, moves all extremities in coordinated fashion, sensation intact    Radiological Exams on Admission: Dg Chest 2 View  Result Date: 03/21/2018 CLINICAL DATA:  Productive cough 5 days with runny nose and congestion. Headache and fever. EXAM: CHEST - 2 VIEW COMPARISON:  08/22/2017 FINDINGS: Lungs are adequately inflated with moderate airspace consolidation over the left mid to lower lung and mild hazy opacification over the right base compatible with multifocal infection. Possible small amount of left pleural fluid. Multiple calcified hilar lymph nodes. Stable cardiomegaly. Remainder of the exam is unchanged. IMPRESSION: Bilateral multifocal airspace process left worse than right likely infection. Suggestion of small left effusion. Stable cardiomegaly. Electronically Signed   By: Marin Olp M.D.   On: 03/12/2018 13:19    EKG: Independently reviewed.  NSR with  rate 73; RBBB with no evidence of acute ischemia   Labs on Admission: I have personally reviewed the available labs and imaging studies at the time of the admission.  Pertinent labs:   Glucose 61 BUN 26/Creatinine 1.72/GFR 32; prior 28/1.39/42 in 3/19 AST 293/ALT 211 Lactate 2.32, 1.41 BNP 573.6 WBC 8.0 Hgb 8.6 - stable Platelets 96 Blood cultures pending  Assessment/Plan Principal Problem:   Sepsis due to pneumonia [facility]) Active Problems:   Chronic diastolic CHF (congestive heart failure) (H[facility])   Acute respiratory failure with hypoxia (H[facility])   Essential hypertension   Dyslipidemia   Acute kidney injury superimposed on chronic kidney disease (HCC)   Elevated LFTs   Sepsis due to CAP, with acute hypoxic respiratory failure -SIRS criteria in this patient includes: Fever, tachypnea, hypoxia  -Patient has evidence of acute organ failure with elevated lactate -While awaiting blood cultures, this appears to be a preseptic condition. -Sepsis protocol initiated -Given productive cough, fever, mildly decreased oxygen saturation, and multifocal infiltrates on chest x-ray, most likely community-acquired pneumonia.  -CURB-65 score is 1-2 - will admit the patient to Med Surg. -Pneumonia Severity Index (PSI) is Class 3, 1% mortality. -Corticosteroids have been to shown to low overall mortality rate; risk of ARDS; and need for mechanical ventilation.  This is particularly true in severe PNA (class 3+ PSI).  Will add 20 mg prednisone daily for 5 days. -Will start Azithromycin 500 mg daily AND Rocephin due to no risk factors for MDR cause. -Additional complicating factors include: hypoxia; associated dehydration; associated pleural effusions. -NS @ 75cc/hr -Fever control -Repeat CBC in am -Sputum cultures -Blood cultures -Strep pneumo testing -Respiratory virus panel ordered to include pertussis -Will order lower respiratory tract procalcitonin level.  Antibiotics would not be indicated  for PCT <0.1 and probably should not be used for < 0.25.  >0.5 indicates infection and >>0.5 indicates more serious disease.  As the procalcitonin level normalizes, it will be reasonable to consider de-escalation of antibiotic coverage. -prn Duonebs -Blood and urine cultures pending -Will admit with telemetry x 24 hours and continue to monitor -Will add HIV -Lactate has  already cleared -Will order lower respiratory tract procalcitonin level.  Antibiotics would not be indicated for PCT <0.1 and probably should not be used for < 0.25.  >0.5 indicates infection and >>0.5 indicates more serious disease.  As the procalcitonin level normalizes, it will be reasonable to consider de-escalation of antibiotic coverage.  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 -Baseline GFR appears to be about 42, currently 32 -Suspect this is related to prerenal azotemia in the setting of sepsis -Will recheck in AM  Elevated LFTs -Possibly due to shock liver -Will recheck in AM, while hydrating overnight  HTN -Continue Nifedipine and Toprol XL  HLD -Continue Lipitor  Chronic diastolic CHF -While she reports a frothy sputum, this is the only characteristic of her illness even vaguely concerning for CHF -Suspect that she has compensated CHF -Preserved EF on echo in 7/18 with mild MR and TR; I do not appreciate a prior echo with a report about the diastolic function of her heart -BNP is slightly higher than prior in March -Will follow  Pulmonary sarcoidosis -Continue Symbicort -She does not appear to be taking chronic steroid or DMARD therapy  Afib on Eliquis -Rate controlled with amiodarone and Toprol XL -Continue Eliquis   DVT prophylaxis:  Eliquis Code Status:  Full - confirmed with patient/family Family Communication: Son present throughout hospitalization  Disposition Plan:  Home once clinically improved Consults called: None  Admission status: Admit - It is my clinical opinion that admission to [facility] is  reasonable and necessary because of the expectation that this patient will require hospital care that crosses at least 2 midnights to treat this condition based on the medical complexity of the problems presented.  Given the aforementioned information, the predictability of an adverse outcome is felt to be significant.     Karmen Bongo MD [facility]  If note is complete, please contact covering daytime or nighttime physician. [email or web address] Password [email or web address]  03/08/2018, 3:21 PM

## 2018-03-24 NOTE — ED Triage Notes (Signed)
Pt has had a productive yellow cough since Tuesday with a runny nose and congestion. She also has a headache and a fever of 103.1

## 2018-03-24 NOTE — ED Provider Notes (Signed)
[facility] Provider Note   CSN: [id number] Arrival date & time: 04/02/2018  1121     History   Chief Complaint Chief Complaint  Patient presents with  . Cough  . Fever    HPI Heather Mckay is a 77 y.o. female.  The history is provided by the patient and medical records. No language interpreter was used.  Cough   Fever   Associated symptoms include cough.     77 year old female with history of hypertension, asthma, CHF presented to the ED with concern of a cough.  For the past week patient has had fever T-max 103, productive cough with yellow sputum, generalized body aches, runny nose and sneezing and generalized fatigue.  Also endorsed increased shortness of breath last night.  Denies any severe headache, active chest pain, abdominal cramping, vomiting or diarrhea or dysuria.  She has been taking TheraFlu without adequate relief.  She denies any recent hospitalization or any recent travel.  She has not had a flu shot.    Past Medical History:  Diagnosis Date  . Asthma   . CHF (congestive heart failure) ([place])   . GERD (gastroesophageal reflux disease)   . HLD (hyperlipidemia)   . Hypertension     Patient Active Problem List   Diagnosis Date Noted  . Pressure injury of skin 08/21/2017  . PNA (pneumonia) 08/20/2017  . Chronic diastolic CHF (congestive heart failure) ([place]) 08/20/2017  . 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 Pulmonary hypertension ([place]) 08/20/2017    Past Surgical History:  Procedure Laterality Date  . ABDOMINAL HYSTERECTOMY    . BREAST LUMPECTOMY Right      OB History   None      Home Medications    Prior to Admission medications   Medication Sig Start Date End Date Taking? Authorizing Provider  allopurinol (ZYLOPRIM) 100 MG tablet Take 200 mg by mouth daily. 06/02/17   [provider]  amiodarone (PACERONE) 200 MG tablet Take 200 mg by mouth daily. 08/14/17   [provider]    apixaban (ELIQUIS) 5 MG TABS tablet Take 5 mg by mouth 2 (two) times daily. 02/13/17   [provider]  aspirin EC 81 MG tablet Take 81 mg by mouth daily. 08/08/11   [provider]  atorvastatin (LIPITOR) 80 MG tablet Take 80 mg by mouth at bedtime. 11/14/16   [provider]  azithromycin (ZITHROMAX) 250 MG tablet Take 1 tablet (250 mg total) by mouth daily. For 3days 08/24/17   Domenic Polite, MD  CALCIUM PO Take 600 mg by mouth daily. 08/17/11   [provider]  cefpodoxime (VANTIN) 100 MG tablet Take 1 tablet (100 mg total) by mouth 2 (two) times daily. For 3days 08/24/17   Domenic Polite, MD  diclofenac sodium (VOLTAREN) 1 % GEL Apply 1 application topically as needed (on her hand).  08/06/17   [provider]  ferrous sulfate 325 (65 FE) MG tablet Take 1 tablet by mouth daily. 05/24/17 05/24/18  [provider]  furosemide (LASIX) 40 MG tablet Take 40-80 mg by mouth See admin instructions. Take 40 mg in the morning and 80 mg in the evening 08/18/17   [provider]  metoprolol succinate (TOPROL-XL) 50 MG 24 hr tablet Take 50 mg by mouth daily. 07/26/17   [provider]  NIFEdipine (ADALAT CC) 90 MG 24 hr tablet Take 90 mg by mouth daily. 06/07/17   [provider]  pantoprazole (PROTONIX) 40 MG tablet Take  40 mg by mouth. 09/20/16   [provider]  potassium chloride SA (K-DUR,KLOR-CON) 20 MEQ tablet Take 20 mEq by mouth daily. 05/24/17 05/24/18  [provider]  predniSONE (DELTASONE) 20 MG tablet Take 1 tablet (20 mg total) by mouth daily with breakfast. Take 20mg  daily for 2days then STOP 08/25/17   Domenic Polite, MD  SANTYL ointment Apply 1 application topically as needed. 06/26/17   [provider]  SYMBICORT 160-4.5 MCG/ACT inhaler Inhale 2 puffs into the lungs 2 (two) times daily. 06/01/17   [provider]  tetrahydrozoline 0.05 % ophthalmic solution Place 2 drops into both  eyes as needed.    [provider]  traMADol (ULTRAM) 50 MG tablet Take 50 mg by mouth 2 (two) times daily. 08/15/17   [provider]  traZODone (DESYREL) 50 MG tablet Take 50 mg by mouth as needed. 05/24/17   [provider]    Family History No family history on file.  Social History Social History   Tobacco Use  . Smoking status: Never Smoker  . Smokeless tobacco: Never Used  Substance Use Topics  . Alcohol use: No    Frequency: Never  . Drug use: No     Allergies   Hydrocodone; Imdur [isosorbide dinitrate]; and Percocet [oxycodone-acetaminophen]   Review of Systems Review of Systems  Constitutional: Positive for fever.  Respiratory: Positive for cough.   All other systems reviewed and are negative.    Physical Exam Updated Vital Signs BP (!) 145/61   Pulse 80   Temp (!) 100.9 F (38.3 C) (Oral)   Resp 18   Ht 5\' 4"  (1.626 m)   Wt 66.2 kg   SpO2 100%   BMI 25.06 kg/m   Physical Exam  Constitutional: She is oriented to person, place, and time. She appears well-developed and well-nourished. No distress.  Elderly female ill-appearing laying in bed  HENT:  Head: Atraumatic.  Ears: TMs normal bilaterally Nose: Mild rhinorrhea Throat: Uvula midline no tonsillar enlargement or exudate  Eyes: Conjunctivae are normal.  Neck: Neck supple. No JVD present.  Cardiovascular: Normal rate and regular rhythm. Exam reveals gallop.  Pulmonary/Chest:  Tachypnea, crackles heard throughout lung bases  Abdominal: Soft. There is no tenderness.  Musculoskeletal: She exhibits no edema.  Neurological: She is alert and oriented to person, place, and time.  Skin: No rash noted.  Psychiatric: She has a normal mood and affect.  Nursing note and vitals reviewed.    ED Treatments / Results  Labs (all labs ordered are listed, but only abnormal results are displayed) Labs Reviewed  COMPREHENSIVE METABOLIC PANEL - Abnormal; Notable for the following  components:      Result Value   Glucose, Bld 61 (*)    BUN 26 (*)    Creatinine, Ser 1.72 (*)    Albumin 3.3 (*)    AST 293 (*)    ALT 211 (*)    GFR calc non Af Amer 28 (*)    GFR calc Af Amer 32 (*)    All other components within normal limits  CBC WITH DIFFERENTIAL/PLATELET - Abnormal; Notable for the following components:   RBC 3.33 (*)    Hemoglobin 8.6 (*)    HCT 27.9 (*)    MCH 25.8 (*)    RDW 17.8 (*)    Platelets 96 (*)    nRBC 0.6 (*)    All other components within normal limits  BRAIN NATRIURETIC PEPTIDE - Abnormal; Notable for the following components:  B Natriuretic Peptide 573.6 (*)    All other components within normal limits  I-STAT CG4 LACTIC ACID, ED - Abnormal; Notable for the following components:   Lactic Acid, Venous 2.32 (*)    All other components within normal limits  RESPIRATORY PANEL BY PCR  CULTURE, BLOOD (ROUTINE X 2)  CULTURE, BLOOD (ROUTINE X 2)  URINALYSIS, ROUTINE W REFLEX MICROSCOPIC  I-STAT CG4 LACTIC ACID, ED    EKG None  Radiology Dg Chest 2 View  Result Date: 03/13/2018 CLINICAL DATA:  Productive cough 5 days with runny nose and congestion. Headache and fever. EXAM: CHEST - 2 VIEW COMPARISON:  08/22/2017 FINDINGS: Lungs are adequately inflated with moderate airspace consolidation over the left mid to lower lung and mild hazy opacification over the right base compatible with multifocal infection. Possible small amount of left pleural fluid. Multiple calcified hilar lymph nodes. Stable cardiomegaly. Remainder of the exam is unchanged. IMPRESSION: Bilateral multifocal airspace process left worse than right likely infection. Suggestion of small left effusion. Stable cardiomegaly. Electronically Signed   By: Marin Olp M.D.   On: 03/17/2018 13:19    Procedures .Critical Care Performed by: Domenic Moras, PA-C Authorized by: Domenic Moras, PA-C   Critical care provider statement:    Critical care time (minutes):  35   Critical care was  time spent personally by me on the following activities:  Discussions with consultants, evaluation of patient's response to treatment, examination of patient, ordering and performing treatments and interventions, ordering and review of laboratory studies, ordering and review of radiographic studies, pulse oximetry, re-evaluation of patient's condition, obtaining history from patient or surrogate and review of old charts   (including critical care time)  Medications Ordered in ED Medications  sodium chloride 0.9 % bolus 1,000 mL (1,000 mLs Intravenous New Bag/Given 03/29/2018 1412)  cefTRIAXone (ROCEPHIN) 2 g in sodium chloride 0.9 % 100 mL IVPB (2 g Intravenous New Bag/Given 03/27/2018 1409)  azithromycin (ZITHROMAX) 500 mg in sodium chloride 0.9 % 250 mL IVPB (has no administration in time range)  acetaminophen (TYLENOL) tablet 650 mg (650 mg Oral Given 03/18/2018 1142)     Initial Impression / Assessment and Plan / ED Course  I have reviewed the triage vital signs and the nursing notes.  Pertinent labs & imaging results that were available during my care of the patient were reviewed by me and considered in my medical decision making (see chart for details).     BP (!) 135/53   Pulse 75   Temp (!) 100.9 F (38.3 C) (Oral)   Resp 14   Ht 5\' 4"  (1.626 m)   Wt 66.2 kg   SpO2 100%   BMI 25.06 kg/m    Final Clinical Impressions(s) / ED Diagnoses   Final diagnoses:  Pneumonia of both lungs due to infectious organism, unspecified part of lung  Transaminitis    ED Discharge Orders    None     1:24 PM Patient here with fever, tachypnea, shortness of breath, and productive cough concerning for pneumonia.  Initial temperature is 103, labs remarkable for an elevated lactic acid of 2.32, transaminitis with AST 293, ALT 211, evidence of AKI with BUN 26, creatinine 1.72, and baseline anemia with hemoglobin of 8.6.  Her chest x-ray is remarkable for bilateral multifocal airspace process left  worse than right likely infection and suggestion of a small left pleural effusion.   I have initiated code sepsis and patient will receive Rocephin and Zithromax.  Given her history of  CHF, and a lactic acid less than 4, patient will receive 1 L of normal saline IV fluid.  Her fever did improve after Tylenol.  Care discussed with Dr. Ralene Bathe.  2:37 PM Lactic acid improved with IV fluid along with antibiotic.  Sepsis reassessment done.  Will consult for admission. Pt is mildly hypoxic in the room with O2 at 89% on RA, will place on [place] at 2L.    Sepsis - Repeat Assessment  Performed at:    1421  Vitals     Blood pressure (!) 135/53, pulse 75, temperature (!) 100.9 F (38.3 C), temperature source Oral, resp. rate 14, height 5\' 4"  (1.626 m), weight 66.2 kg, SpO2 100 %.  Heart:     Regular rate and rhythm  Lungs:    Rales  Capillary Refill:   <2 sec  Peripheral Pulse:   Radial pulse palpable  Skin:     Normal Color  2:46 PM Appreciate consultation from [facility] Dr. Lorin Mercy who agrees to see and admit pt for further management of her multifocal pneumonia. Transaminitis could be medication induce or due to Shock liver as suggested by Dr Lorin Mercy.     Domenic Moras, PA-C 03/31/2018 1448    Quintella Reichert, MD 03/27/18 [phone]

## 2018-03-25 ENCOUNTER — Inpatient Hospital Stay (HOSPITAL_COMMUNITY): Payer: Medicare Other

## 2018-03-25 DIAGNOSIS — J9601 Acute respiratory failure with hypoxia: Secondary | ICD-10-CM

## 2018-03-25 DIAGNOSIS — D869 Sarcoidosis, unspecified: Secondary | ICD-10-CM

## 2018-03-25 DIAGNOSIS — J189 Pneumonia, unspecified organism: Secondary | ICD-10-CM

## 2018-03-25 DIAGNOSIS — A419 Sepsis, unspecified organism: Secondary | ICD-10-CM

## 2018-03-25 LAB — COMPREHENSIVE METABOLIC PANEL
ALBUMIN: 3 g/dL — AB (ref 3.5–5.0)
ALT: 262 U/L — AB (ref 0–44)
AST: 402 U/L — AB (ref 15–41)
Alkaline Phosphatase: 61 U/L (ref 38–126)
Anion gap: 10 (ref 5–15)
BILIRUBIN TOTAL: 1.1 mg/dL (ref 0.3–1.2)
BUN: 28 mg/dL — AB (ref 8–23)
CHLORIDE: 107 mmol/L (ref 98–111)
CO2: 19 mmol/L — ABNORMAL LOW (ref 22–32)
CREATININE: 1.7 mg/dL — AB (ref 0.44–1.00)
Calcium: 8.6 mg/dL — ABNORMAL LOW (ref 8.9–10.3)
GFR calc Af Amer: 33 mL/min — ABNORMAL LOW (ref >60)
GFR calc non Af Amer: 28 mL/min — ABNORMAL LOW (ref >60)
GLUCOSE: 75 mg/dL (ref 70–99)
Potassium: 4.4 mmol/L (ref 3.5–5.1)
Sodium: 136 mmol/L (ref 135–145)
TOTAL PROTEIN: 7.3 g/dL (ref 6.5–8.1)

## 2018-03-25 LAB — BLOOD GAS, ARTERIAL
Acid-base deficit: 6.2 mmol/L — ABNORMAL HIGH (ref 0.0–2.0)
Bicarbonate: 18.8 mmol/L — ABNORMAL LOW (ref 20.0–28.0)
DRAWN BY: 27553
Delivery systems: POSITIVE
Expiratory PAP: 8
FIO2: 100
INSPIRATORY PAP: 12
O2 SAT: 96.1 %
PCO2 ART: 38.1 mmHg (ref 32.0–48.0)
PO2 ART: 98.1 mmHg (ref 83.0–108.0)
Patient temperature: 98.6
pH, Arterial: 7.315 — ABNORMAL LOW (ref 7.350–7.450)

## 2018-03-25 LAB — CBC WITH DIFFERENTIAL/PLATELET
Abs Immature Granulocytes: 0.02 10*3/uL (ref 0.00–0.07)
BASOS PCT: 0 %
Basophils Absolute: 0 10*3/uL (ref 0.0–0.1)
EOS ABS: 0.2 10*3/uL (ref 0.0–0.5)
EOS PCT: 2 %
HEMATOCRIT: 28.1 % — AB (ref 36.0–46.0)
Hemoglobin: 9.1 g/dL — ABNORMAL LOW (ref 12.0–15.0)
Immature Granulocytes: 0 %
Lymphocytes Relative: 12 %
Lymphs Abs: 1 10*3/uL (ref 0.7–4.0)
MCH: 26.1 pg (ref 26.0–34.0)
MCHC: 32.4 g/dL (ref 30.0–36.0)
MCV: 80.5 fL (ref 80.0–100.0)
MONO ABS: 0.1 10*3/uL (ref 0.1–1.0)
Monocytes Relative: 2 %
Neutro Abs: 7.3 10*3/uL (ref 1.7–7.7)
Neutrophils Relative %: 84 %
PLATELETS: 96 10*3/uL — AB (ref 150–400)
RBC: 3.49 MIL/uL — ABNORMAL LOW (ref 3.87–5.11)
RDW: 17.6 % — AB (ref 11.5–15.5)
WBC: 8.7 10*3/uL (ref 4.0–10.5)
nRBC: 0 % (ref 0.0–0.2)

## 2018-03-25 LAB — PROCALCITONIN: PROCALCITONIN: 1.26 ng/mL

## 2018-03-25 LAB — STREP PNEUMONIAE URINARY ANTIGEN: STREP PNEUMO URINARY ANTIGEN: NEGATIVE

## 2018-03-25 MED ORDER — PHENOL 1.4 % MT LIQD: 1.0000 | OROMUCOSAL | Status: DC | PRN

## 2018-03-25 MED ORDER — BUDESONIDE 0.5 MG/2ML IN SUSP
0.5000 mg | Freq: Two times a day (BID) | RESPIRATORY_TRACT | Status: DC
  Administered 2018-03-25 – 2018-04-17 (×46): 0.5 mg via RESPIRATORY_TRACT
  Filled 2018-03-25 (×46): qty 2

## 2018-03-25 MED ORDER — MENTHOL 3 MG MT LOZG: 1.0000 | LOZENGE | OROMUCOSAL | Status: DC | PRN

## 2018-03-25 MED ORDER — IPRATROPIUM-ALBUTEROL 0.5-2.5 (3) MG/3ML IN SOLN
3.0000 mL | Freq: Four times a day (QID) | RESPIRATORY_TRACT | Status: DC
  Administered 2018-03-25 – 2018-04-17 (×92): 3 mL via RESPIRATORY_TRACT
  Filled 2018-03-25 (×89): qty 3

## 2018-03-25 MED ORDER — GUAIFENESIN ER 600 MG PO TB12
600.0000 mg | ORAL_TABLET | Freq: Two times a day (BID) | ORAL | Status: DC
  Administered 2018-03-25 (×2): 600 mg via ORAL
  Filled 2018-03-25 (×2): qty 1

## 2018-03-25 MED ORDER — METHYLPREDNISOLONE SODIUM SUCC 125 MG IJ SOLR
60.0000 mg | Freq: Three times a day (TID) | INTRAMUSCULAR | Status: DC
  Administered 2018-03-25 (×2): 60 mg via INTRAVENOUS
  Filled 2018-03-25 (×2): qty 2

## 2018-03-25 MED ORDER — ORAL CARE MOUTH RINSE: 15.0000 mL | Freq: Two times a day (BID) | OROMUCOSAL | Status: DC

## 2018-03-25 MED ORDER — BUDESONIDE 0.25 MG/2ML IN SUSP
0.2500 mg | Freq: Two times a day (BID) | RESPIRATORY_TRACT | Status: DC
  Administered 2018-03-25: 0.25 mg via RESPIRATORY_TRACT

## 2018-03-25 MED ORDER — VANCOMYCIN HCL IN DEXTROSE 1-5 GM/200ML-% IV SOLN
1000.0000 mg | Freq: Once | INTRAVENOUS | Status: AC
  Administered 2018-03-25: 1000 mg via INTRAVENOUS
  Filled 2018-03-25 (×2): qty 200

## 2018-03-25 MED ORDER — FUROSEMIDE 10 MG/ML IJ SOLN
80.0000 mg | Freq: Once | INTRAMUSCULAR | Status: AC
  Administered 2018-03-25: 80 mg via INTRAVENOUS
  Filled 2018-03-25: qty 8

## 2018-03-25 MED ORDER — CHLORHEXIDINE GLUCONATE 0.12 % MT SOLN
15.0000 mL | Freq: Two times a day (BID) | OROMUCOSAL | Status: DC
  Administered 2018-03-25: 15 mL via OROMUCOSAL
  Filled 2018-03-25: qty 15

## 2018-03-25 NOTE — Consult Note (Signed)
NAME:  Heather Mckay, MRN:  [id number], DOB:  1940/09/24, LOS: 1 ADMISSION DATE:  03/19/2018, CONSULTATION DATE:  10/21 REFERRING MD:  Dr. Sloan Leiter, CHIEF COMPLAINT:  Shortness of Breath    Brief History   77 y/o F who presented with 5 days of shortness of breath, myalgias, fever and cough.  Work up concerning for diffuse consolidation bilaterally worrisome for multifocal PNA, L>R.  RVP was positive for metapneumovirus.    Past Medical History  CHF, HTN, HLD, GERD, Asthma, Invasive ductal carcinoma, MGUS, pulmonary sarcoidosis, OSA on CPAP with PRN O2 at night of 2-3L and during day.   Significant Hospital Events   10/20 Admit 10/21 PCCM consulted   Consults: date of consult/date signed off & final recs:  10/21 PCCM consulted   Procedures (surgical and bedside):    Significant Diagnostic Tests:    Micro Data:  RVP 10/20 >> positive for metapneumovirus  BCx2 10/20 >>  Sputum 10/20 >>   Antimicrobials:  Ceftriaxone 10/20 >> Azithro 10/20 >>   Subjective:  PT reports feeling better on BiPAP.  RT reports weaning O2 on BiPAP.    Objective   Blood pressure (!) 113/52, pulse 74, temperature 98.2 F (36.8 C), resp. rate 20, height 5\' 4"  (1.626 m), weight 67.2 kg, SpO2 92 %.        Intake/Output Summary (Last 24 hours) at 03/25/2018 1448 Last data filed at 03/25/2018 1203 Gross per 24 hour  Intake 2771.22 ml  Output 400 ml  Net 2371.22 ml   Filed Weights   03/31/2018 1204 03/21/2018 1602  Weight: 66.2 kg 67.2 kg    Examination: General: elderly female lying in bed.  Son at bedside.   HEENT: MM pink/moist, BiPAP mask in place Neuro: Awake, alert, appropriate, MAE CV: s1s2 rrr, no m/r/g PULM: even/non-labored, lungs bilaterally coarse, bibasilar crackles  YF:VCBS, non-tender, bsx4 active  Extremities: warm/dry, trace to 1+ BLE edema  Skin: no rashes or lesions  Resolved Hospital Problem list     Assessment & Plan:   Acute on Chronic Hypoxemic Respiratory Failure    - in setting of diffuse bilateral infiltrates  - metapneumovirus positive with underlying sarcoid - 2-3L O2 dependent at baseline P: Wean O2 for sats >90% back to baseline O2 needs Pulmonary hygiene - IS, mobilize  Solumedrol 60 mg IV Q8, wean to off as tolerated    Diffuse Bilateral Infiltrates L>R, Community Acquired PNA, Metapneumovirus  P: Continue rocephin / azithromycin as ordered, D2/x Follow cultures  Trend PCT  Consider amiodarone toxicity if does not clear with therapy Assess CT imaging to better evaluate infiltrates  Follow CXR IV Vanco x1 Assess U. Strep antigen, legionella If decompensates and needs intubation, will need FOB  OSA on CPAP  P: Continue nocturnal CPAP use with O2 as needed for sats >90%   Pulmonary Sarcoidosis  - not on steroids at baseline but has been on in the past  P: Supportive care, wean steroids above to off   AKI P: Trend BMP / urinary output Replace electrolytes as indicated Avoid nephrotoxic agents, ensure adequate renal perfusion   Elevated LFT's  - suspect in setting of sarcoid  P: Follow while inpatient Consider close follow up with Rheumatology after discharge   Disposition / Summary of Today's Plan 03/25/18   Continue BIPAP support.  Low threshold to transfer to ICU if O2 needs change.      Diet: Heart healthy Pain/Anxiety/Delirium protocol (if indicated): n/a VAP protocol (if indicated): n/a DVT prophylaxis: Eliquis  GI prophylaxis: n/a  Hyperglycemia protocol: n/a Mobility: As tolerated  Code Status: Full Code  Family Communication: Son updated at bedside 10/21  Labs   CBC: Recent Labs  Lab 03/29/2018 1150 03/25/18 0447  WBC 8.0 8.7  NEUTROABS 6.8 7.3  HGB 8.6* 9.1*  HCT 27.9* 28.1*  MCV 83.8 80.5  PLT 96* 96*    Basic Metabolic Panel: Recent Labs  Lab 03/13/2018 1150 03/25/18 0447  NA 135 136  K 4.0 4.4  CL 104 107  CO2 23 19*  GLUCOSE 61* 75  BUN 26* 28*  CREATININE 1.72* 1.70*  CALCIUM  9.1 8.6*   GFR: Estimated Creatinine Clearance: 26.5 mL/min (A) (by C-G formula based on SCr of 1.7 mg/dL (H)). Recent Labs  Lab 03/08/2018 1150 03/05/2018 1200 03/21/2018 1351 03/25/18 0447  PROCALCITON 0.73  --   --   --   WBC 8.0  --   --  8.7  LATICACIDVEN  --  2.32* 1.41  --     Liver Function Tests: Recent Labs  Lab 03/25/2018 1150 03/25/18 0447  AST 293* 402*  ALT 211* 262*  ALKPHOS 64 61  BILITOT 0.9 1.1  PROT 7.7 7.3  ALBUMIN 3.3* 3.0*   No results for input(s): LIPASE, AMYLASE in the last 168 hours. No results for input(s): AMMONIA in the last 168 hours.  ABG No results found for: PHART, PCO2ART, PO2ART, HCO3, TCO2, ACIDBASEDEF, O2SAT   Coagulation Profile: No results for input(s): INR, PROTIME in the last 168 hours.  Cardiac Enzymes: No results for input(s): CKTOTAL, CKMB, CKMBINDEX, TROPONINI in the last 168 hours.  HbA1C: No results found for: HGBA1C  CBG: No results for input(s): GLUCAP in the last 168 hours.  Admitting History of Present Illness.   77 y/o F who presented to [facility] on 10/20 with a 5 day history of fever, myalgias, cough and shortness of breath.      Work up concerning for multifocal PNA with respiratory failure.  She was started on broad spectrum antibiotics and admitted to the hospitalist service.    Review of Systems: Positives in bold   Gen: Denies fever, chills, weight change, fatigue, night sweats HEENT: Denies blurred vision, double vision, hearing loss, tinnitus, sinus congestion, rhinorrhea, sore throat, neck stiffness, dysphagia PULM: Denies shortness of breath, cough, sputum production, hemoptysis, wheezing CV: Denies chest pain, edema, orthopnea, paroxysmal nocturnal dyspnea, palpitations GI: Denies abdominal pain, nausea, vomiting, diarrhea, hematochezia, melena, constipation, change in bowel habits GU: Denies dysuria, hematuria, polyuria, oliguria, urethral discharge Endocrine: Denies hot or cold intolerance, polyuria,  polyphagia or appetite change Derm: Denies rash, dry skin, scaling or peeling skin change Heme: Denies easy bruising, bleeding, bleeding gums Neuro: Denies headache, numbness, weakness, slurred speech, loss of memory or consciousness   Past Medical History  She,  has a past medical history of Asthma, CHF (congestive heart failure) ([place]), GERD (gastroesophageal reflux disease), HLD (hyperlipidemia), and Hypertension.   Surgical History    Past Surgical History:  Procedure Laterality Date  . ABDOMINAL HYSTERECTOMY    . BREAST LUMPECTOMY Right      Social History   Social History   Socioeconomic History  . Marital status: Widowed    Spouse name: Not on file  . Number of children: Not on file  . Years of education: Not on file  . Highest education level: Not on file  Occupational History  . Occupation: retired  Scientific laboratory technician  . Financial resource strain: Not on file  . Food insecurity:  Worry: Not on file    Inability: Not on file  . Transportation needs:    Medical: Not on file    Non-medical: Not on file  Tobacco Use  . Smoking status: Never Smoker  . Smokeless tobacco: Never Used  Substance and Sexual Activity  . Alcohol use: Not Currently    Frequency: Never  . Drug use: No  . Sexual activity: Not on file  Lifestyle  . Physical activity:    Days per week: Not on file    Minutes per session: Not on file  . Stress: Not on file  Relationships  . Social connections:    Talks on phone: Not on file    Gets together: Not on file    Attends religious service: Not on file    Active member of club or organization: Not on file    Attends meetings of clubs or organizations: Not on file    Relationship status: Not on file  . Intimate partner violence:    Fear of current or ex partner: Not on file    Emotionally abused: Not on file    Physically abused: Not on file    Forced sexual activity: Not on file  Other Topics Concern  . Not on file  Social History Narrative   . Not on file  ,  reports that she has never smoked. She has never used smokeless tobacco. She reports that she drank alcohol. She reports that she does not use drugs.   Family History   Her family history is not on file.   Allergies Allergies  Allergen Reactions  . Hydroxychloroquine Swelling    Patient reported increased swelling of her thyroid gland area. But patient has thyromegaly. Unclear if it is really from Plaquenil or not.  . Hydrocodone     Itching  . Imdur [Isosorbide Dinitrate]     Itching  . Percocet [Oxycodone-Acetaminophen]     Itching     Home Medications  Prior to Admission medications   Medication Sig Start Date End Date Taking? Authorizing Provider  allopurinol (ZYLOPRIM) 100 MG tablet Take 200 mg by mouth daily. 06/02/17  Yes [provider]  amiodarone (PACERONE) 200 MG tablet Take 200 mg by mouth daily. 08/14/17  Yes [provider]  apixaban (ELIQUIS) 5 MG TABS tablet Take 5 mg by mouth 2 (two) times daily. 02/13/17  Yes [provider]  aspirin EC 81 MG tablet Take 81 mg by mouth daily. 08/08/11  Yes [provider]  atorvastatin (LIPITOR) 80 MG tablet Take 80 mg by mouth at bedtime. 11/14/16  Yes [provider]  CALCIUM PO Take 600 mg by mouth daily. 08/17/11  Yes [provider]  diclofenac sodium (VOLTAREN) 1 % GEL Apply 1 application topically as needed (on her hand).  08/06/17  Yes [provider]  ferrous sulfate 325 (65 FE) MG tablet Take 1 tablet by mouth daily. 05/24/17 05/24/18 Yes [provider]  furosemide (LASIX) 40 MG tablet Take 40-80 mg by mouth See admin instructions. Take 80 mg in the morning and 40 mg in the evening 08/18/17  Yes [provider]  metoprolol succinate (TOPROL-XL) 50 MG 24 hr tablet Take 50 mg by mouth daily. 07/26/17  Yes [provider]  NIFEdipine (ADALAT CC) 90 MG 24 hr tablet Take 90 mg by mouth daily. 06/07/17  Yes [provider]  pantoprazole (PROTONIX) 40 MG tablet Take 40 mg by mouth. 09/20/16  Yes [provider]  potassium chloride SA (K-DUR,KLOR-CON) 20 MEQ tablet Take 20 mEq by mouth daily. 05/24/17 05/24/18 Yes [provider]  ramelteon (ROZEREM) 8 MG tablet Take 8 mg by mouth at bedtime as needed. 03/02/18  Yes [provider]  SANTYL ointment Apply 1 application topically as needed. 06/26/17  Yes [provider]  SYMBICORT 160-4.5 MCG/ACT inhaler Inhale 2 puffs into the lungs 2 (two) times daily. 06/01/17  Yes [provider]  tetrahydrozoline 0.05 % ophthalmic solution Place 2 drops into both eyes as needed.   Yes [provider]  traMADol (ULTRAM) 50 MG tablet Take 50 mg by mouth 2 (two) times daily. 08/15/17  Yes [provider]  traZODone (DESYREL) 50 MG tablet Take 50 mg by mouth at bedtime as needed for sleep.  05/24/17  Yes [provider]     Noe Gens, NP-C [facility] Pgr: [phone] or if no answer [phone] 03/25/2018, 2:48 PM

## 2018-03-25 NOTE — Progress Notes (Signed)
Pharmacy Antibiotic Note  Teddy Pena is a 77 y.o. female admitted on 03/17/2018 with pneumonia and AKI. Pharmacy has been consulted for vancomycin dosing. Pt is on D#2 rocephin + azithromycin. Consult message noted to give one time dose vancomycin. I did clarify with Ms. Ollis, and confirmed that MD only want one time dose for today and re-evaluate if pt will need vancomycin tomorrow.  Scr 1.7 (elevated from bl ~ 1.3)  Plan: Vancomycin 1g IV x 1 Will f/u tomorrow on if need to continue vancomycin   Height: 5\' 4"  (162.6 cm) Weight: 148 lb 2.4 oz (67.2 kg) IBW/kg (Calculated) : 54.7  Temp (24hrs), Avg:98.3 F (36.8 C), Min:98 F (36.7 C), Max:99.1 F (37.3 C)  Recent Labs  Lab 03/28/2018 1150 03/11/2018 1200 03/15/2018 1351 03/25/18 0447  WBC 8.0  --   --  8.7  CREATININE 1.72*  --   --  1.70*  LATICACIDVEN  --  2.32* 1.41  --     Estimated Creatinine Clearance: 26.5 mL/min (A) (by C-G formula based on SCr of 1.7 mg/dL (H)).    Allergies  Allergen Reactions  . Hydroxychloroquine Swelling    Patient reported increased swelling of her thyroid gland area. But patient has thyromegaly. Unclear if it is really from Plaquenil or not.  . Hydrocodone     Itching  . Imdur [Isosorbide Dinitrate]     Itching  . Percocet [Oxycodone-Acetaminophen]     Itching    Antimicrobials this admission: Azithromycin 10/20 >>  Rocephin 10/20 >>  Vancomycin 10/21 >>  Dose adjustments this admission:   Microbiology results: 10/20 BCx:   Thank you for allowing pharmacy to be a part of this patient's care.  Manley Mason 03/25/2018 4:01 PM

## 2018-03-25 NOTE — Progress Notes (Addendum)
PROGRESS NOTE        PATIENT DETAILS Name: Heather Mckay Age: 77 y.o. Sex: female Date of Birth: [birth year] Admit Date: 03/19/2018 Admitting Physician Karmen Bongo, MD ALP:[id number], Hadley Pen, MD  Brief Narrative: Patient is a 77 y.o. female only sarcoidosis, fungal asthma, chronic hypoxic respiratory failure on home O2, A. fib on Eliquis, OSA, chronic diastolic heart failure presenting with 5-day history of fever, myalgias, cough and worsening shortness of breath.  Patient was found to have multifocal pneumonia with worsening respiratory failure and started on broad-spectrum antimicrobial therapy, admitted to the hospitalist service.  See below for further details.  Subjective: On CPAP this morning-wheezing-does not feel any better than yesterday.  Per nursing staff-difficult to come off CPAP-apparently does not tolerate nasal cannula well.  Assessment/Plan: Acute on chronic hypoxic respiratory failure: Secondary to multifocal pneumonia-however she is wheezing this morning-BNP is also significantly elevated-Per nursing staff- apparently does not tolerate being on nasal cannula well has required constant CPAP-we will give Lasix IV x1, start Solu-Medrol, and scheduled bronchodilators.  Empiric antimicrobial therapy will be continued.  We will continue to monitor closely-and see if she can tolerate being liberated of CPAP this morning-on too high flow nasal cannula.  Sepsis secondary to multifocal pneumonia: Secondary to probably viral pneumonia-respiratory virus panel positive for Metapneumonia virus.  Patient has pulmonary sarcoidosis-hence on empiric Rocephin and Zithromax for now.  Await culture data.  See above regarding-respiratory failure.  AKI on CKD stage III: AKI likely hemodynamically mediated-renal function will be monitored closely.  Check renal ultrasound.  Due to concern for pulmonary edema causing worsening hypoxic respiratory failure (increased BNP) will  give 1 dose of IV Lasix and assess.  Thrombocytopenia:?  Secondary to sepsis-follow for now-cautiously continue with Eliquis.  Will stop aspirin for now.  Transaminitis: Probably secondary to sepsis-viral pneumonia-check RUQ ultrasound, hepatitis serology.  Follow.  PAF: Rate controlled with amiodarone and Toprol-continue Eliquis.  Chronic diastolic heart failure: No obvious signs of volume overload-but does have worsening respiratory failure/hypoxia-which probably is related to pneumonia-but not sure if CHF is contributing-giving 1 dose of Lasix.  Follow.  Pulmonary sarcoidosis: Apparently previously was on steroids-he was taken off steroids last fall-she does follow-up with a pulmonologist in [facility].  Currently has other active issues including sepsis/pneumonia and worsening respiratory failure-see above.  Dyslipidemia: Continue statin  Hypertension: Controlled-continue with nifedipine and Toprol.  Follow.  Thrombocytopenia:?  Secondary to sepsis-follow for now-cautiously continue with Eliquis.  Will stop aspirin for now.  Telemetry (independently reviewed):NSR  DVT Prophylaxis: Prophylactic Eliquis  Code Status: Full code   Family Communication: None at bedside  Disposition Plan: Remain inpatient-will require several more days of hospitalization before consideration of discharge.  Antimicrobial agents: Anti-infectives (From admission, onward)   Start     Dose/Rate Route Frequency Ordered Stop   03/19/2018 1330  cefTRIAXone (ROCEPHIN) 2 g in sodium chloride 0.9 % 100 mL IVPB     2 g 200 mL/hr over 30 Minutes Intravenous Every 24 hours 04/02/2018 1316     03/10/2018 1330  azithromycin (ZITHROMAX) 500 mg in sodium chloride 0.9 % 250 mL IVPB     500 mg 250 mL/hr over 60 Minutes Intravenous Every 24 hours 03/23/2018 1316        Procedures: None  CONSULTS:  None  Time spent: 35 minutes-Greater than 50% of this time was spent in  counseling, explanation of diagnosis,  planning of further management, and coordination of care.  MEDICATIONS: Scheduled Meds: . allopurinol  200 mg Oral Daily  . amiodarone  200 mg Oral Daily  . apixaban  5 mg Oral BID  . aspirin EC  81 mg Oral Daily  . atorvastatin  80 mg Oral QHS  . budesonide (PULMICORT) nebulizer solution  0.25 mg Nebulization BID  . ferrous sulfate  325 mg Oral QPC breakfast  . furosemide  80 mg Intravenous Once  . ipratropium-albuterol  3 mL Nebulization Q6H  . methylPREDNISolone (SOLU-MEDROL) injection  60 mg Intravenous Q8H  . metoprolol succinate  50 mg Oral Daily  . NIFEdipine  90 mg Oral Daily  . pantoprazole  40 mg Oral Daily  . traMADol  50 mg Oral BID   Continuous Infusions: . sodium chloride 75 mL/hr at 03/25/18 0900  . azithromycin Stopped (03/29/2018 1552)  . cefTRIAXone (ROCEPHIN)  IV Stopped (03/16/2018 1439)   PRN Meds:.benzonatate, traZODone   PHYSICAL EXAM: Vital signs: Vitals:   03/25/18 0002 03/25/18 0207 03/25/18 0420 03/25/18 0951  BP: (!) 123/57 (!) 110/55 (!) 113/52   Pulse: 64 61 64   Resp: 18 18 18    Temp: 98.1 F (36.7 C) 98.2 F (36.8 C) 98.2 F (36.8 C)   TempSrc:      SpO2: 95% 95% 94% (!) 88%  Weight:      Height:       Filed Weights   03/11/2018 1204 03/25/2018 1602  Weight: 66.2 kg 67.2 kg   Body mass index is 25.43 kg/m.   General appearance :Awake, alert, not in any distress.  Awake and CPAP. HEENT: Atraumatic and Normocephalic Neck: supple Resp:Good air entry bilaterally, rales bilaterally up to mid lung. CVS: S1 S2 regular GI: Bowel sounds present, Non tender and not distended with no gaurding, rigidity or rebound.No organomegaly Extremities: B/L Lower Ext shows no edema, both legs are warm to touch Neurology:  speech clear,Non focal, sensation is grossly intact. Psychiatric: Normal judgment and insight. Alert and oriented x 3.  Musculoskeletal:No digital cyanosis Skin:No Rash, warm and dry Wounds:N/A  I have personally reviewed following  labs and imaging studies  LABORATORY DATA: CBC: Recent Labs  Lab 03/16/2018 1150 03/25/18 0447  WBC 8.0 8.7  NEUTROABS 6.8 7.3  HGB 8.6* 9.1*  HCT 27.9* 28.1*  MCV 83.8 80.5  PLT 96* 96*    Basic Metabolic Panel: Recent Labs  Lab 03/15/2018 1150 03/25/18 0447  NA 135 136  K 4.0 4.4  CL 104 107  CO2 23 19*  GLUCOSE 61* 75  BUN 26* 28*  CREATININE 1.72* 1.70*  CALCIUM 9.1 8.6*    GFR: Estimated Creatinine Clearance: 26.5 mL/min (A) (by C-G formula based on SCr of 1.7 mg/dL (H)).  Liver Function Tests: Recent Labs  Lab 03/19/2018 1150 03/25/18 0447  AST 293* 402*  ALT 211* 262*  ALKPHOS 64 61  BILITOT 0.9 1.1  PROT 7.7 7.3  ALBUMIN 3.3* 3.0*   No results for input(s): LIPASE, AMYLASE in the last 168 hours. No results for input(s): AMMONIA in the last 168 hours.  Coagulation Profile: No results for input(s): INR, PROTIME in the last 168 hours.  Cardiac Enzymes: No results for input(s): CKTOTAL, CKMB, CKMBINDEX, TROPONINI in the last 168 hours.  BNP (last 3 results) No results for input(s): PROBNP in the last 8760 hours.  HbA1C: No results for input(s): HGBA1C in the last 72 hours.  CBG: No results for input(s): GLUCAP in  the last 168 hours.  Lipid Profile: No results for input(s): CHOL, HDL, LDLCALC, TRIG, CHOLHDL, LDLDIRECT in the last 72 hours.  Thyroid Function Tests: No results for input(s): TSH, T4TOTAL, FREET4, T3FREE, THYROIDAB in the last 72 hours.  Anemia Panel: No results for input(s): VITAMINB12, FOLATE, FERRITIN, TIBC, IRON, RETICCTPCT in the last 72 hours.  Urine analysis: No results found for: COLORURINE, APPEARANCEUR, LABSPEC, PHURINE, GLUCOSEU, HGBUR, BILIRUBINUR, KETONESUR, PROTEINUR, UROBILINOGEN, NITRITE, LEUKOCYTESUR  Sepsis Labs: Lactic Acid, Venous    Component Value Date/Time   LATICACIDVEN 1.41 04/01/2018 1351    MICROBIOLOGY: Recent Results (from the past 240 hour(s))  Blood Culture (routine x 2)     Status: None  (Preliminary result)   Collection Time: 03/23/2018  1:30 PM  Result Value Ref Range Status   Specimen Description BLOOD LEFT ANTECUBITAL  Final   Special Requests   Final    BOTTLES DRAWN AEROBIC AND ANAEROBIC Blood Culture results may not be optimal due to an excessive volume of blood received in culture bottles   Culture   Final    NO GROWTH < 24 HOURS Performed at [facility] [address], [place], [place] [address]    Report Status PENDING  Incomplete  Respiratory Panel by PCR     Status: Abnormal   Collection Time: 03/18/2018  1:54 PM  Result Value Ref Range Status   Adenovirus NOT DETECTED NOT DETECTED Final   Coronavirus 229E NOT DETECTED NOT DETECTED Final   Coronavirus HKU1 NOT DETECTED NOT DETECTED Final   Coronavirus NL63 NOT DETECTED NOT DETECTED Final   Coronavirus OC43 NOT DETECTED NOT DETECTED Final   Metapneumovirus DETECTED (A) NOT DETECTED Final   Rhinovirus / Enterovirus NOT DETECTED NOT DETECTED Final   Influenza A NOT DETECTED NOT DETECTED Final   Influenza B NOT DETECTED NOT DETECTED Final   Parainfluenza Virus 1 NOT DETECTED NOT DETECTED Final   Parainfluenza Virus 2 NOT DETECTED NOT DETECTED Final   Parainfluenza Virus 3 NOT DETECTED NOT DETECTED Final   Parainfluenza Virus 4 NOT DETECTED NOT DETECTED Final   Respiratory Syncytial Virus NOT DETECTED NOT DETECTED Final   Bordetella pertussis NOT DETECTED NOT DETECTED Final   Chlamydophila pneumoniae NOT DETECTED NOT DETECTED Final   Mycoplasma pneumoniae NOT DETECTED NOT DETECTED Final  Blood Culture (routine x 2)     Status: None (Preliminary result)   Collection Time: 03/08/2018  4:50 PM  Result Value Ref Range Status   Specimen Description BLOOD SITE NOT SPECIFIED  Final   Special Requests   Final    BOTTLES DRAWN AEROBIC ONLY Blood Culture adequate volume   Culture   Final    NO GROWTH < 24 HOURS Performed at [facility], [address], [place], [place] [address]    Report Status PENDING   Incomplete    RADIOLOGY STUDIES/RESULTS: Dg Chest 2 View  Result Date: 03/23/2018 CLINICAL DATA:  Productive cough 5 days with runny nose and congestion. Headache and fever. EXAM: CHEST - 2 VIEW COMPARISON:  08/22/2017 FINDINGS: Lungs are adequately inflated with moderate airspace consolidation over the left mid to lower lung and mild hazy opacification over the right base compatible with multifocal infection. Possible small amount of left pleural fluid. Multiple calcified hilar lymph nodes. Stable cardiomegaly. Remainder of the exam is unchanged. IMPRESSION: Bilateral multifocal airspace process left worse than right likely infection. Suggestion of small left effusion. Stable cardiomegaly. Electronically Signed   By: Marin Olp M.D.   On:  03/06/2018 13:19     LOS: 1 day   Oren Binet, MD  [facility]  If 7PM-7AM, please contact night-coverage  Please page via [email or web address]-Password [email or web address]-click on MD name and type text message  03/25/2018, 10:53 AM

## 2018-03-25 NOTE — Progress Notes (Cosign Needed)
PROGRESS NOTE    Heather Mckay  LYY:[id number] DOB: Aug 06, 1940 DOA: 03/19/2018 PCP: Dionicia Abler, MDOutpatient Specialists:   Brief Narrative: Heather Mckay is a 77 y.o patient, admitted because of sepsis due to pneumonia. Has a history of CHF, HTN, and afib on Eliquis. Patient presented to the ED with cough, fever, and mild hypoxia. Given 1 L IVF with improvement in lactic acid. Chest x-ray shows bilateral multifocal airspace process left worse than right likely infection and suggestion of a small left pleural effusion.   Assessment & Plan:   Sepsis due to CAP: Patient is no longer tachypneic, has no fever, but still has a low oxygen saturation. Blood culture is pending. Continue on IV fluids, azithromycin and Rochephin. Tylenol PRN for fever.   Acute respiratory failure with hypoxia: Secondary to CAP/sepsis.  Patient still has a low oxygen saturation, but not tachypneic or tachycardic. Continue  prednisone.   AKI on chronic kidney disease: hemodynamically mediated as a result of sepsis. Has a lower GFR than her baseline. Continue monitoring GFR.   Elevated LFTs: likely because of sepsis. Continue monitoring LFT levels.   Chronic diastolic CHF: Patient BNP is slightly elevated. Continue monitoring.  Pulmonary sarcoidosis: Continue Symbicort  Afib: Continue amiodarone, metoprolol, and Eliquis  Essential hypertension: Continue metoprolol, nifedipine  Hyperlipidemia: Continue atorvastatin  OSA: CPAP at night.   DVT prophylaxis:  Eliquis  Code Status:  Full code  Family Communication:  No family present at bed side  Disposition Plan:  Consultants:  None  Procedures:  None  Antimicrobials: Azithromycin Rocephin  Subjective: Patient reported that she still has productive cough and dyspnea. Denied chest pain.   Objective: Vitals:   03/12/2018 2243 03/25/18 0002 03/25/18 0207 03/25/18 0420  BP:  (!) 123/57 (!) 110/55 (!) 113/52  Pulse: 72 64 61 64  Resp: 18 18 18  18   Temp:  98.1 F (36.7 C) 98.2 F (36.8 C) 98.2 F (36.8 C)  TempSrc:      SpO2: 98% 95% 95% 94%  Weight:      Height:        Intake/Output Summary (Last 24 hours) at 03/25/2018 0731 Last data filed at 03/25/2018 0400 Gross per 24 hour  Intake 2510.33 ml  Output -  Net 2510.33 ml   Filed Weights   03/28/2018 1204 03/16/2018 1602  Weight: 66.2 kg 67.2 kg    Examination:  General exam: Appears calm with mild comfortable  Respiratory system: Diffuse rhonch throughout lung fields. Respiratory effort is abnormal. Cardiovascular system: S1 & S2 heard, RRR. No JVD, murmurs, rubs, gallops or clicks. No pedal edema. Gastrointestinal system: Abdomen is nondistended, soft and nontender. No organomegaly or masses felt. Normal bowel sounds heard. Central nervous system: Alert and oriented. No focal neurological deficits. Extremities: Symmetric 5 x 5 power. No swelling on lower extremities  Skin: No rashes, lesions or ulcers Psychiatry: Judgement and insight appear normal. Mood & affect appropriate.     Data Reviewed: I have personally reviewed following labs and imaging studies  CBC: Recent Labs  Lab 03/18/2018 1150  WBC 8.0  NEUTROABS 6.8  HGB 8.6*  HCT 27.9*  MCV 83.8  PLT 96*   Basic Metabolic Panel: Recent Labs  Lab 03/10/2018 1150 03/25/18 0447  NA 135 136  K 4.0 4.4  CL 104 107  CO2 23 19*  GLUCOSE 61* 75  BUN 26* 28*  CREATININE 1.72* 1.70*  CALCIUM 9.1 8.6*   GFR: Estimated Creatinine Clearance: 26.5 mL/min (A) (by C-G formula based  on SCr of 1.7 mg/dL (H)). Liver Function Tests: Recent Labs  Lab 03/21/2018 1150 03/25/18 0447  AST 293* 402*  ALT 211* 262*  ALKPHOS 64 61  BILITOT 0.9 1.1  PROT 7.7 7.3  ALBUMIN 3.3* 3.0*   No results for input(s): LIPASE, AMYLASE in the last 168 hours. No results for input(s): AMMONIA in the last 168 hours. Coagulation Profile: No results for input(s): INR, PROTIME in the last 168 hours. Cardiac Enzymes: No results  for input(s): CKTOTAL, CKMB, CKMBINDEX, TROPONINI in the last 168 hours. BNP (last 3 results) No results for input(s): PROBNP in the last 8760 hours. HbA1C: No results for input(s): HGBA1C in the last 72 hours. CBG: No results for input(s): GLUCAP in the last 168 hours. Lipid Profile: No results for input(s): CHOL, HDL, LDLCALC, TRIG, CHOLHDL, LDLDIRECT in the last 72 hours. Thyroid Function Tests: No results for input(s): TSH, T4TOTAL, FREET4, T3FREE, THYROIDAB in the last 72 hours. Anemia Panel: No results for input(s): VITAMINB12, FOLATE, FERRITIN, TIBC, IRON, RETICCTPCT in the last 72 hours. Urine analysis: No results found for: COLORURINE, APPEARANCEUR, LABSPEC, PHURINE, GLUCOSEU, HGBUR, BILIRUBINUR, KETONESUR, PROTEINUR, UROBILINOGEN, NITRITE, LEUKOCYTESUR Sepsis Labs: @LABRCNTIP (procalcitonin:4,lacticidven:4)  ) Recent Results (from the past 240 hour(s))  Respiratory Panel by PCR     Status: Abnormal   Collection Time: 03/15/2018  1:54 PM  Result Value Ref Range Status   Adenovirus NOT DETECTED NOT DETECTED Final   Coronavirus 229E NOT DETECTED NOT DETECTED Final   Coronavirus HKU1 NOT DETECTED NOT DETECTED Final   Coronavirus NL63 NOT DETECTED NOT DETECTED Final   Coronavirus OC43 NOT DETECTED NOT DETECTED Final   Metapneumovirus DETECTED (A) NOT DETECTED Final   Rhinovirus / Enterovirus NOT DETECTED NOT DETECTED Final   Influenza A NOT DETECTED NOT DETECTED Final   Influenza B NOT DETECTED NOT DETECTED Final   Parainfluenza Virus 1 NOT DETECTED NOT DETECTED Final   Parainfluenza Virus 2 NOT DETECTED NOT DETECTED Final   Parainfluenza Virus 3 NOT DETECTED NOT DETECTED Final   Parainfluenza Virus 4 NOT DETECTED NOT DETECTED Final   Respiratory Syncytial Virus NOT DETECTED NOT DETECTED Final   Bordetella pertussis NOT DETECTED NOT DETECTED Final   Chlamydophila pneumoniae NOT DETECTED NOT DETECTED Final   Mycoplasma pneumoniae NOT DETECTED NOT DETECTED Final          Radiology Studies: Dg Chest 2 View  Result Date: 03/29/2018 CLINICAL DATA:  Productive cough 5 days with runny nose and congestion. Headache and fever. EXAM: CHEST - 2 VIEW COMPARISON:  08/22/2017 FINDINGS: Lungs are adequately inflated with moderate airspace consolidation over the left mid to lower lung and mild hazy opacification over the right base compatible with multifocal infection. Possible small amount of left pleural fluid. Multiple calcified hilar lymph nodes. Stable cardiomegaly. Remainder of the exam is unchanged. IMPRESSION: Bilateral multifocal airspace process left worse than right likely infection. Suggestion of small left effusion. Stable cardiomegaly. Electronically Signed   By: Marin Olp M.D.   On: 04/04/2018 13:19        Scheduled Meds: . allopurinol  200 mg Oral Daily  . amiodarone  200 mg Oral Daily  . apixaban  5 mg Oral BID  . aspirin EC  81 mg Oral Daily  . atorvastatin  80 mg Oral QHS  . ferrous sulfate  325 mg Oral QPC breakfast  . metoprolol succinate  50 mg Oral Daily  . mometasone-formoterol  2 puff Inhalation BID  . NIFEdipine  90 mg Oral Daily  .  pantoprazole  40 mg Oral Daily  . predniSONE  20 mg Oral Q breakfast  . traMADol  50 mg Oral BID   Continuous Infusions: . sodium chloride 75 mL/hr at 03/10/2018 1735  . azithromycin Stopped (03/08/2018 1552)  . cefTRIAXone (ROCEPHIN)  IV Stopped (03/05/2018 1439)     LOS: 1 day    Time spent:     Sterling Big, MD [facility] Pager [phone]  If 7PM-7AM, please contact night-coverage [email or web address] Password [facility] 03/25/2018, 7:31 AM

## 2018-03-26 ENCOUNTER — Inpatient Hospital Stay (HOSPITAL_COMMUNITY): Payer: Medicare Other

## 2018-03-26 DIAGNOSIS — I5032 Chronic diastolic (congestive) heart failure: Secondary | ICD-10-CM

## 2018-03-26 DIAGNOSIS — I351 Nonrheumatic aortic (valve) insufficiency: Secondary | ICD-10-CM

## 2018-03-26 DIAGNOSIS — R40243 Glasgow coma scale score 3-8, unspecified time: Secondary | ICD-10-CM

## 2018-03-26 DIAGNOSIS — J123 Human metapneumovirus pneumonia: Secondary | ICD-10-CM

## 2018-03-26 DIAGNOSIS — N179 Acute kidney failure, unspecified: Secondary | ICD-10-CM

## 2018-03-26 DIAGNOSIS — M7989 Other specified soft tissue disorders: Secondary | ICD-10-CM

## 2018-03-26 DIAGNOSIS — N189 Chronic kidney disease, unspecified: Secondary | ICD-10-CM

## 2018-03-26 LAB — POCT I-STAT 3, ART BLOOD GAS (G3+)
Acid-base deficit: 8 mmol/L — ABNORMAL HIGH (ref 0.0–2.0)
Acid-base deficit: 8 mmol/L — ABNORMAL HIGH (ref 0.0–2.0)
BICARBONATE: 16 mmol/L — AB (ref 20.0–28.0)
BICARBONATE: 17.4 mmol/L — AB (ref 20.0–28.0)
O2 Saturation: 96 %
O2 Saturation: 81 %
PH ART: 7.376 (ref 7.350–7.450)
PO2 ART: 82 mmHg — AB (ref 83.0–108.0)
TCO2: 17 mmol/L — ABNORMAL LOW (ref 22–32)
TCO2: 18 mmol/L — ABNORMAL LOW (ref 22–32)
pCO2 arterial: 27.3 mmHg — ABNORMAL LOW (ref 32.0–48.0)
pCO2 arterial: 29.2 mmHg — ABNORMAL LOW (ref 32.0–48.0)
pH, Arterial: 7.37 (ref 7.350–7.450)
pO2, Arterial: 40 mmHg — CL (ref 83.0–108.0)

## 2018-03-26 LAB — BLOOD GAS, ARTERIAL
ACID-BASE DEFICIT: 7 mmol/L — AB (ref 0.0–2.0)
Acid-base deficit: 11.3 mmol/L — ABNORMAL HIGH (ref 0.0–2.0)
Acid-base deficit: 7.4 mmol/L — ABNORMAL HIGH (ref 0.0–2.0)
Acid-base deficit: 8 mmol/L — ABNORMAL HIGH (ref 0.0–2.0)
BICARBONATE: 17.2 mmol/L — AB (ref 20.0–28.0)
Bicarbonate: 15.3 mmol/L — ABNORMAL LOW (ref 20.0–28.0)
Bicarbonate: 16.5 mmol/L — ABNORMAL LOW (ref 20.0–28.0)
Bicarbonate: 17.5 mmol/L — ABNORMAL LOW (ref 20.0–28.0)
DELIVERY SYSTEMS: POSITIVE
DRAWN BY: 406621
Drawn by: 34719
Drawn by: 406621
Drawn by: 406621
EXPIRATORY PAP: 12
FIO2: 80
FIO2: 80
FIO2: 80
FIO2: 90
Inspiratory PAP: 16
LHR: 15 {breaths}/min
LHR: 15 {breaths}/min
MECHVT: 400 mL
MECHVT: 400 mL
O2 SAT: 88 %
O2 SAT: 92.2 %
O2 Saturation: 72.6 %
O2 Saturation: 89.9 %
PATIENT TEMPERATURE: 98.5
PATIENT TEMPERATURE: 98.6
PCO2 ART: 32.3 mmHg (ref 32.0–48.0)
PEEP/CPAP: 10 cmH2O
PEEP: 5 cmH2O
PEEP: 12 cmH2O
PH ART: 7.354 (ref 7.350–7.450)
PO2 ART: 60.2 mmHg — AB (ref 83.0–108.0)
Patient temperature: 96
Patient temperature: 98.6
RATE: 15 resp/min
VT: 400 mL
pCO2 arterial: 30.2 mmHg — ABNORMAL LOW (ref 32.0–48.0)
pCO2 arterial: 30.5 mmHg — ABNORMAL LOW (ref 32.0–48.0)
pCO2 arterial: 41.4 mmHg (ref 32.0–48.0)
pH, Arterial: 7.192 — CL (ref 7.350–7.450)
pH, Arterial: 7.351 (ref 7.350–7.450)
pH, Arterial: 7.366 (ref 7.350–7.450)
pO2, Arterial: 49.6 mmHg — ABNORMAL LOW (ref 83.0–108.0)
pO2, Arterial: 59.1 mmHg — ABNORMAL LOW (ref 83.0–108.0)
pO2, Arterial: 71.3 mmHg — ABNORMAL LOW (ref 83.0–108.0)

## 2018-03-26 LAB — RENAL FUNCTION PANEL
ANION GAP: 16 — AB (ref 5–15)
Albumin: 2.3 g/dL — ABNORMAL LOW (ref 3.5–5.0)
BUN: 46 mg/dL — ABNORMAL HIGH (ref 8–23)
CHLORIDE: 104 mmol/L (ref 98–111)
CO2: 16 mmol/L — AB (ref 22–32)
CREATININE: 2.41 mg/dL — AB (ref 0.44–1.00)
Calcium: 7.2 mg/dL — ABNORMAL LOW (ref 8.9–10.3)
GFR calc non Af Amer: 18 mL/min — ABNORMAL LOW (ref >60)
GFR, EST AFRICAN AMERICAN: 21 mL/min — AB (ref >60)
Glucose, Bld: 382 mg/dL — ABNORMAL HIGH (ref 70–99)
Phosphorus: 5.1 mg/dL — ABNORMAL HIGH (ref 2.5–4.6)
Potassium: 4.6 mmol/L (ref 3.5–5.1)
Sodium: 136 mmol/L (ref 135–145)

## 2018-03-26 LAB — CBC
HCT: 26.3 % — ABNORMAL LOW (ref 36.0–46.0)
Hemoglobin: 8.4 g/dL — ABNORMAL LOW (ref 12.0–15.0)
MCH: 25.5 pg — ABNORMAL LOW (ref 26.0–34.0)
MCHC: 31.9 g/dL (ref 30.0–36.0)
MCV: 79.9 fL — AB (ref 80.0–100.0)
NRBC: 0.2 % (ref 0.0–0.2)
PLATELETS: 116 10*3/uL — AB (ref 150–400)
RBC: 3.29 MIL/uL — ABNORMAL LOW (ref 3.87–5.11)
RDW: 17.3 % — ABNORMAL HIGH (ref 11.5–15.5)
WBC: 11.3 10*3/uL — AB (ref 4.0–10.5)

## 2018-03-26 LAB — URINALYSIS, ROUTINE W REFLEX MICROSCOPIC
Bilirubin Urine: NEGATIVE
GLUCOSE, UA: NEGATIVE mg/dL
HGB URINE DIPSTICK: NEGATIVE
Ketones, ur: NEGATIVE mg/dL
Leukocytes, UA: NEGATIVE
Nitrite: NEGATIVE
PH: 5 (ref 5.0–8.0)
PROTEIN: NEGATIVE mg/dL
Specific Gravity, Urine: 1.012 (ref 1.005–1.030)

## 2018-03-26 LAB — GLUCOSE, CAPILLARY
GLUCOSE-CAPILLARY: 231 mg/dL — AB (ref 70–99)
GLUCOSE-CAPILLARY: 186 mg/dL — AB (ref 70–99)
GLUCOSE-CAPILLARY: 220 mg/dL — AB (ref 70–99)
GLUCOSE-CAPILLARY: 355 mg/dL — AB (ref 70–99)
GLUCOSE-CAPILLARY: 267 mg/dL — AB (ref 70–99)
GLUCOSE-CAPILLARY: 239 mg/dL — AB (ref 70–99)
Glucose-Capillary: 220 mg/dL — ABNORMAL HIGH (ref 70–99)
Glucose-Capillary: 293 mg/dL — ABNORMAL HIGH (ref 70–99)
Glucose-Capillary: 322 mg/dL — ABNORMAL HIGH (ref 70–99)

## 2018-03-26 LAB — PROTIME-INR
INR: 2.17
Prothrombin Time: 23.9 seconds — ABNORMAL HIGH (ref 11.4–15.2)

## 2018-03-26 LAB — BASIC METABOLIC PANEL
ANION GAP: 16 — AB (ref 5–15)
BUN: 40 mg/dL — AB (ref 8–23)
CO2: 12 mmol/L — AB (ref 22–32)
Calcium: 8.4 mg/dL — ABNORMAL LOW (ref 8.9–10.3)
Chloride: 108 mmol/L (ref 98–111)
Creatinine, Ser: 2.41 mg/dL — ABNORMAL HIGH (ref 0.44–1.00)
GFR calc Af Amer: 21 mL/min — ABNORMAL LOW (ref >60)
GFR, EST NON AFRICAN AMERICAN: 18 mL/min — AB (ref >60)
GLUCOSE: 149 mg/dL — AB (ref 70–99)
Potassium: 5.2 mmol/L — ABNORMAL HIGH (ref 3.5–5.1)
Sodium: 136 mmol/L (ref 135–145)

## 2018-03-26 LAB — HEPATITIS PANEL, ACUTE
HEP B C IGM: NEGATIVE
Hep A IgM: NEGATIVE
Hepatitis B Surface Ag: NEGATIVE

## 2018-03-26 LAB — LACTIC ACID, PLASMA
LACTIC ACID, VENOUS: 6.5 mmol/L — AB (ref 0.5–1.9)
LACTIC ACID, VENOUS: 7.1 mmol/L — AB (ref 0.5–1.9)

## 2018-03-26 LAB — TROPONIN I
TROPONIN I: 0.06 ng/mL — AB (ref <0.03)
TROPONIN I: 0.07 ng/mL — AB (ref <0.03)
Troponin I: 0.06 ng/mL (ref <0.03)

## 2018-03-26 LAB — PHOSPHORUS: Phosphorus: 3.9 mg/dL (ref 2.5–4.6)

## 2018-03-26 LAB — HEPATIC FUNCTION PANEL
ALT: 319 U/L — AB (ref 0–44)
AST: 662 U/L — ABNORMAL HIGH (ref 15–41)
Albumin: 2.5 g/dL — ABNORMAL LOW (ref 3.5–5.0)
Alkaline Phosphatase: 101 U/L (ref 38–126)
Bilirubin, Direct: 0.9 mg/dL — ABNORMAL HIGH (ref 0.0–0.2)
Indirect Bilirubin: 0.6 mg/dL (ref 0.3–0.9)
Total Bilirubin: 1.5 mg/dL — ABNORMAL HIGH (ref 0.3–1.2)
Total Protein: 6.3 g/dL — ABNORMAL LOW (ref 6.5–8.1)

## 2018-03-26 LAB — PROCALCITONIN: Procalcitonin: 3.6 ng/mL

## 2018-03-26 LAB — AMMONIA: AMMONIA: 73 umol/L — AB (ref 9–35)

## 2018-03-26 LAB — ECHOCARDIOGRAM COMPLETE
HEIGHTINCHES: 62 in
WEIGHTICAEL: 2370.39 [oz_av]

## 2018-03-26 LAB — MAGNESIUM
MAGNESIUM: 1.7 mg/dL (ref 1.7–2.4)
Magnesium: 1.7 mg/dL (ref 1.7–2.4)

## 2018-03-26 LAB — MRSA PCR SCREENING: MRSA by PCR: POSITIVE — AB

## 2018-03-26 LAB — CORTISOL: Cortisol, Plasma: 24.2 ug/dL

## 2018-03-26 MED ORDER — MIDAZOLAM HCL 2 MG/2ML IJ SOLN
INTRAMUSCULAR | Status: AC
  Filled 2018-03-26: qty 2

## 2018-03-26 MED ORDER — NOREPINEPHRINE 4 MG/250ML-% IV SOLN
0.0000 ug/min | INTRAVENOUS | Status: DC
  Administered 2018-03-26: 10 ug/min via INTRAVENOUS
  Administered 2018-03-26: 20 ug/min via INTRAVENOUS
  Administered 2018-03-26: 40 ug/min via INTRAVENOUS
  Filled 2018-03-26 (×4): qty 250

## 2018-03-26 MED ORDER — BISACODYL 10 MG RE SUPP: 10.0000 mg | Freq: Every day | RECTAL | Status: DC | PRN

## 2018-03-26 MED ORDER — VASOPRESSIN 20 UNIT/ML IV SOLN
0.0300 [IU]/min | INTRAVENOUS | Status: DC
  Administered 2018-03-26 – 2018-03-27 (×2): 0.03 [IU]/min via INTRAVENOUS
  Filled 2018-03-26 (×3): qty 2

## 2018-03-26 MED ORDER — FENTANYL CITRATE (PF) 100 MCG/2ML IJ SOLN
50.0000 ug | INTRAMUSCULAR | Status: DC | PRN
  Administered 2018-03-26 – 2018-04-02 (×11): 50 ug via INTRAVENOUS
  Filled 2018-03-26 (×10): qty 2

## 2018-03-26 MED ORDER — MIDAZOLAM HCL 2 MG/2ML IJ SOLN
2.0000 mg | Freq: Once | INTRAMUSCULAR | Status: AC
  Administered 2018-03-26: 2 mg via INTRAVENOUS

## 2018-03-26 MED ORDER — SODIUM BICARBONATE 8.4 % IV SOLN
INTRAVENOUS | Status: AC
  Filled 2018-03-26: qty 50

## 2018-03-26 MED ORDER — SODIUM CHLORIDE 0.9% FLUSH: 10.0000 mL | INTRAVENOUS | Status: DC | PRN

## 2018-03-26 MED ORDER — CHLORHEXIDINE GLUCONATE 0.12% ORAL RINSE (MEDLINE KIT)
15.0000 mL | Freq: Two times a day (BID) | OROMUCOSAL | Status: DC
  Administered 2018-03-26 – 2018-04-17 (×44): 15 mL via OROMUCOSAL

## 2018-03-26 MED ORDER — MUPIROCIN 2 % EX OINT
1.0000 "application " | TOPICAL_OINTMENT | Freq: Two times a day (BID) | CUTANEOUS | Status: AC
  Administered 2018-03-26 – 2018-03-30 (×10): 1 via NASAL
  Filled 2018-03-26: qty 22

## 2018-03-26 MED ORDER — DOCUSATE SODIUM 50 MG/5ML PO LIQD
100.0000 mg | Freq: Two times a day (BID) | ORAL | Status: DC
  Administered 2018-03-26 – 2018-04-16 (×41): 100 mg
  Filled 2018-03-26 (×42): qty 10

## 2018-03-26 MED ORDER — MIDAZOLAM HCL 2 MG/2ML IJ SOLN
1.0000 mg | INTRAMUSCULAR | Status: DC | PRN
  Administered 2018-03-26 – 2018-04-08 (×38): 1 mg via INTRAVENOUS
  Filled 2018-03-26 (×41): qty 2

## 2018-03-26 MED ORDER — HYDROCORTISONE NA SUCCINATE PF 100 MG IJ SOLR
50.0000 mg | Freq: Four times a day (QID) | INTRAMUSCULAR | Status: DC
  Administered 2018-03-26 – 2018-03-30 (×17): 50 mg via INTRAVENOUS
  Filled 2018-03-26 (×17): qty 2

## 2018-03-26 MED ORDER — VITAL HIGH PROTEIN PO LIQD
1000.0000 mL | ORAL | Status: DC
  Administered 2018-03-26 – 2018-04-10 (×15): 1000 mL

## 2018-03-26 MED ORDER — LACTATED RINGERS IV BOLUS
500.0000 mL | Freq: Once | INTRAVENOUS | Status: AC
  Administered 2018-03-26: 500 mL via INTRAVENOUS

## 2018-03-26 MED ORDER — CHLORHEXIDINE GLUCONATE CLOTH 2 % EX PADS
6.0000 | MEDICATED_PAD | Freq: Every day | CUTANEOUS | Status: AC
  Administered 2018-03-27 – 2018-03-31 (×5): 6 via TOPICAL

## 2018-03-26 MED ORDER — VITAL HIGH PROTEIN PO LIQD: 1000.0000 mL | ORAL | Status: DC

## 2018-03-26 MED ORDER — SODIUM CHLORIDE 0.9 % IV SOLN
2.0000 g | INTRAVENOUS | Status: AC
  Administered 2018-03-26 – 2018-03-28 (×3): 2 g via INTRAVENOUS
  Filled 2018-03-26 (×3): qty 20

## 2018-03-26 MED ORDER — MIDAZOLAM HCL 2 MG/2ML IJ SOLN
1.0000 mg | INTRAMUSCULAR | Status: AC | PRN
  Administered 2018-03-27 (×3): 1 mg via INTRAVENOUS
  Filled 2018-03-26 (×3): qty 2

## 2018-03-26 MED ORDER — FENTANYL CITRATE (PF) 100 MCG/2ML IJ SOLN
50.0000 ug | INTRAMUSCULAR | Status: DC | PRN
  Administered 2018-03-27: 50 ug via INTRAVENOUS
  Filled 2018-03-26 (×2): qty 2

## 2018-03-26 MED ORDER — SODIUM BICARBONATE 8.4 % IV SOLN
50.0000 meq | Freq: Once | INTRAVENOUS | Status: AC
  Administered 2018-03-26: 50 meq via INTRAVENOUS

## 2018-03-26 MED ORDER — ALLOPURINOL 100 MG PO TABS
200.0000 mg | ORAL_TABLET | Freq: Every day | ORAL | Status: DC
  Administered 2018-03-26 – 2018-04-08 (×14): 200 mg via ORAL
  Filled 2018-03-26 (×14): qty 2

## 2018-03-26 MED ORDER — INSULIN ASPART 100 UNIT/ML ~~LOC~~ SOLN
0.0000 [IU] | SUBCUTANEOUS | Status: DC
  Administered 2018-03-26: 5 [IU] via SUBCUTANEOUS
  Administered 2018-03-26: 11 [IU] via SUBCUTANEOUS
  Administered 2018-03-26: 5 [IU] via SUBCUTANEOUS
  Administered 2018-03-26 (×2): 8 [IU] via SUBCUTANEOUS
  Administered 2018-03-27 (×3): 5 [IU] via SUBCUTANEOUS
  Administered 2018-03-27 – 2018-03-28 (×2): 3 [IU] via SUBCUTANEOUS
  Administered 2018-03-28 (×2): 5 [IU] via SUBCUTANEOUS
  Administered 2018-03-28: 2 [IU] via SUBCUTANEOUS
  Administered 2018-03-28 – 2018-03-29 (×6): 3 [IU] via SUBCUTANEOUS
  Administered 2018-03-30: 5 [IU] via SUBCUTANEOUS
  Administered 2018-03-30: 3 [IU] via SUBCUTANEOUS
  Administered 2018-03-30 (×3): 5 [IU] via SUBCUTANEOUS
  Administered 2018-03-30 – 2018-03-31 (×2): 2 [IU] via SUBCUTANEOUS
  Administered 2018-03-31 (×3): 3 [IU] via SUBCUTANEOUS
  Administered 2018-03-31 (×2): 2 [IU] via SUBCUTANEOUS
  Administered 2018-04-01 (×3): 5 [IU] via SUBCUTANEOUS
  Administered 2018-04-01: 3 [IU] via SUBCUTANEOUS
  Administered 2018-04-01: 5 [IU] via SUBCUTANEOUS
  Administered 2018-04-02: 3 [IU] via SUBCUTANEOUS
  Administered 2018-04-02: 5 [IU] via SUBCUTANEOUS
  Administered 2018-04-02 (×2): 3 [IU] via SUBCUTANEOUS
  Administered 2018-04-02: 2 [IU] via SUBCUTANEOUS
  Administered 2018-04-02: 3 [IU] via SUBCUTANEOUS
  Administered 2018-04-03: 5 [IU] via SUBCUTANEOUS
  Administered 2018-04-03 (×2): 3 [IU] via SUBCUTANEOUS
  Administered 2018-04-03: 2 [IU] via SUBCUTANEOUS
  Administered 2018-04-03 – 2018-04-04 (×3): 3 [IU] via SUBCUTANEOUS
  Administered 2018-04-04: 2 [IU] via SUBCUTANEOUS
  Administered 2018-04-04 (×2): 3 [IU] via SUBCUTANEOUS
  Administered 2018-04-04 – 2018-04-05 (×3): 2 [IU] via SUBCUTANEOUS
  Administered 2018-04-05: 3 [IU] via SUBCUTANEOUS
  Administered 2018-04-05 (×3): 2 [IU] via SUBCUTANEOUS
  Administered 2018-04-06 (×2): 3 [IU] via SUBCUTANEOUS
  Administered 2018-04-06: 2 [IU] via SUBCUTANEOUS
  Administered 2018-04-06: 3 [IU] via SUBCUTANEOUS
  Administered 2018-04-06 – 2018-04-07 (×3): 2 [IU] via SUBCUTANEOUS
  Administered 2018-04-07 – 2018-04-08 (×4): 3 [IU] via SUBCUTANEOUS
  Administered 2018-04-08: 2 [IU] via SUBCUTANEOUS
  Administered 2018-04-08: 3 [IU] via SUBCUTANEOUS
  Administered 2018-04-08: 2 [IU] via SUBCUTANEOUS
  Administered 2018-04-08: 3 [IU] via SUBCUTANEOUS
  Administered 2018-04-09 (×2): 2 [IU] via SUBCUTANEOUS
  Administered 2018-04-09: 3 [IU] via SUBCUTANEOUS
  Administered 2018-04-09 – 2018-04-10 (×3): 2 [IU] via SUBCUTANEOUS
  Administered 2018-04-10: 3 [IU] via SUBCUTANEOUS
  Administered 2018-04-10: 2 [IU] via SUBCUTANEOUS
  Administered 2018-04-10: 3 [IU] via SUBCUTANEOUS
  Administered 2018-04-11 (×3): 2 [IU] via SUBCUTANEOUS
  Administered 2018-04-11: 3 [IU] via SUBCUTANEOUS
  Administered 2018-04-11 (×2): 2 [IU] via SUBCUTANEOUS
  Administered 2018-04-12 (×3): 5 [IU] via SUBCUTANEOUS
  Administered 2018-04-12 (×3): 3 [IU] via SUBCUTANEOUS
  Administered 2018-04-13: 5 [IU] via SUBCUTANEOUS
  Administered 2018-04-13: 3 [IU] via SUBCUTANEOUS
  Administered 2018-04-13 (×2): 5 [IU] via SUBCUTANEOUS
  Administered 2018-04-13: 3 [IU] via SUBCUTANEOUS
  Administered 2018-04-13: 5 [IU] via SUBCUTANEOUS
  Administered 2018-04-14 (×2): 8 [IU] via SUBCUTANEOUS
  Administered 2018-04-14 (×3): 5 [IU] via SUBCUTANEOUS
  Administered 2018-04-14 – 2018-04-16 (×10): 8 [IU] via SUBCUTANEOUS
  Administered 2018-04-16 (×2): 5 [IU] via SUBCUTANEOUS
  Administered 2018-04-17: 8 [IU] via SUBCUTANEOUS
  Administered 2018-04-17: 3 [IU] via SUBCUTANEOUS
  Administered 2018-04-17: 5 [IU] via SUBCUTANEOUS
  Administered 2018-04-17: 2 [IU] via SUBCUTANEOUS

## 2018-03-26 MED ORDER — SODIUM BICARBONATE 8.4 % IV SOLN
INTRAVENOUS | Status: DC
  Administered 2018-03-26: 05:00:00 via INTRAVENOUS
  Filled 2018-03-26 (×2): qty 150

## 2018-03-26 MED ORDER — PIPERACILLIN-TAZOBACTAM IN DEX 2-0.25 GM/50ML IV SOLN
2.2500 g | Freq: Three times a day (TID) | INTRAVENOUS | Status: DC
  Administered 2018-03-26: 2.25 g via INTRAVENOUS
  Filled 2018-03-26 (×3): qty 50

## 2018-03-26 MED ORDER — PRO-STAT SUGAR FREE PO LIQD
30.0000 mL | Freq: Two times a day (BID) | ORAL | Status: DC
  Administered 2018-03-26: 30 mL
  Filled 2018-03-26: qty 30

## 2018-03-26 MED ORDER — SODIUM CHLORIDE 0.9 % IV SOLN
500.0000 mg | INTRAVENOUS | Status: AC
  Administered 2018-03-26 – 2018-03-28 (×3): 500 mg via INTRAVENOUS
  Filled 2018-03-26 (×3): qty 500

## 2018-03-26 MED ORDER — VITAL HIGH PROTEIN PO LIQD
1000.0000 mL | ORAL | Status: DC
  Administered 2018-03-26: 1000 mL

## 2018-03-26 MED ORDER — FENTANYL CITRATE (PF) 100 MCG/2ML IJ SOLN
INTRAMUSCULAR | Status: AC
  Filled 2018-03-26: qty 2

## 2018-03-26 MED ORDER — ORAL CARE MOUTH RINSE
15.0000 mL | OROMUCOSAL | Status: DC
  Administered 2018-03-26 – 2018-04-17 (×216): 15 mL via OROMUCOSAL

## 2018-03-26 MED ORDER — PANTOPRAZOLE SODIUM 40 MG PO PACK: 40.0000 mg | PACK | Freq: Every day | ORAL | Status: DC

## 2018-03-26 MED ORDER — VANCOMYCIN VARIABLE DOSE PER UNSTABLE RENAL FUNCTION (PHARMACIST DOSING): Status: DC

## 2018-03-26 MED ORDER — NOREPINEPHRINE 16 MG/250ML-% IV SOLN
0.0000 ug/min | INTRAVENOUS | Status: DC
  Administered 2018-03-26: 35 ug/min via INTRAVENOUS
  Administered 2018-03-28: 8 ug/min via INTRAVENOUS
  Filled 2018-03-26 (×2): qty 250

## 2018-03-26 MED ORDER — FAMOTIDINE IN NACL 20-0.9 MG/50ML-% IV SOLN
20.0000 mg | INTRAVENOUS | Status: DC
  Administered 2018-03-26 – 2018-04-08 (×14): 20 mg via INTRAVENOUS
  Filled 2018-03-26 (×14): qty 50

## 2018-03-26 MED ORDER — AMIODARONE HCL 200 MG PO TABS
200.0000 mg | ORAL_TABLET | Freq: Every day | ORAL | Status: DC
  Filled 2018-03-26: qty 1

## 2018-03-26 NOTE — Progress Notes (Signed)
Preliminary notes--Left upper extremity venous duplex exam completed. Positive for acute DVT on one of the brachial veins's short segmental thrombosis. There is no evidence of thrombosis on left IJV and Subclavian vein.  Hongying Zaley Talley (RDMS RVT) 03/26/18 5:51 PM

## 2018-03-26 NOTE — Progress Notes (Signed)
Pts creatinine still elevated at 2.41, phos now elevated at 5.1, 2nd troponin elevated at 0.06. All relayed to CCM MD

## 2018-03-26 NOTE — Progress Notes (Signed)
Pts second lactic is 7.1, core temp 94.8 (bair hugger on), CBG 322 w/ no coverage. All relayed to Dr. Lake Bells.

## 2018-03-26 NOTE — Progress Notes (Signed)
RT called to bedside for pt desatting. RT obtained ABG, increased FIO2 on the BIPAP from 80% to 100%. Pt sats are very erratic ranging from 76 to 91. RT and RN have replaced sat probe multiple times in an attempt to get a consistent sat on pt. Portable CXR performed and ABG results called to MD. Elink called and notified of pt ABG results. Notified that CCM is following pt. CCM MD at bedside.   ABG results Ph 7.192 PaCO2 41.4 HCO3 15.3 PaO2 49.6  RT will continue to monitor.

## 2018-03-26 NOTE — Significant Event (Signed)
Rapid Response Event Note  Received a call from 5W RN at 0251 about patient's mental status declining and low BPs. I went up to assess the patient, and patient was not as responsive as before, would open eyes briefly to voice, still on the BIPAP. HR 40s and SBP in the 70s (Manual).   RNs had paged the [facility] NP already, I contacted the PCCM NP and NP came to bedside. [facility] NP did call me and was updated on patient's status.   Patient decompensated further, was agonal, patient was taken off BIPAP, manually ventilated via BVM, Atropine and Bicarb were given. Levophed drip was started and patient was intubated by PCCM MD. Patient was transferred to [facility] - MICU.   Start Time 0251 End Time 0430

## 2018-03-26 NOTE — Progress Notes (Signed)
  Echocardiogram 2D Echocardiogram has been performed.  Heather Mckay 03/26/2018, 3:43 PM

## 2018-03-26 NOTE — Procedures (Signed)
Intubation Procedure Note Heather Mckay [id number] January 02, 1941  Procedure: Intubation Indications: Respiratory insufficiency  Procedure Details Consent: Unable to obtain consent because of altered level of consciousness. Time Out: Verified patient identification, verified procedure, site/side was marked, verified correct patient position, special equipment/implants available, medications/allergies/relevent history reviewed, required imaging and test results available.  Performed  Maximum sterile technique was used including gloves, hand hygiene and mask.  MAC and 4 Fentanyl 50 and Versed 33m for RSI  Evaluation Hemodynamic Status: Transient hypotension treated with pressors; O2 sats: transiently fell during during procedure Patient's Current Condition: stable Complications: No apparent complications Patient did tolerate procedure well. Chest X-ray ordered to verify placement.  CXR: pending.   KRise PaganiniScatliffe 03/26/2018

## 2018-03-26 NOTE — Progress Notes (Signed)
Pt noted to continue to desat on BIPAP. Respiratory called to the bedside. ABG and portable chest X-Ray called to the bedside. Patient is currently asymptomatic and tachyphrenic. MD notified.

## 2018-03-26 NOTE — Procedures (Signed)
Arterial Catheter Insertion Procedure Note Heather Mckay [id number] 05/16/1941  Procedure: Insertion of Arterial Catheter  Indications: Blood pressure monitoring and Frequent blood sampling  Procedure Details Consent: Unable to obtain consent because of emergent medical necessity. Time Out: Verified patient identification, verified procedure, site/side was marked, verified correct patient position, special equipment/implants available, medications/allergies/relevent history reviewed, required imaging and test results available.  Performed  Maximum sterile technique was used including antiseptics, cap, gloves, gown, hand hygiene, mask and sheet. Skin prep: Chlorhexidine; local anesthetic administered 20 gauge catheter was inserted into right femoral artery using the Seldinger technique. ULTRASOUND GUIDANCE USED: YES Evaluation Blood flow good; BP tracing good. Complications: No apparent complications.   Heather Mckay 03/26/2018

## 2018-03-26 NOTE — Significant Event (Addendum)
Rapid Response Event Note  Received initial call at 1212 by 5W RN stated " patient's ABG is bad and patient needs to be in the ICU". I was with another patient in a medical emergency, I advised the RNs to call the provider on call and that I would come see the patient as soon as I could.  I called the RT, ABG was reviewed with RT, patient was not in distress.  When I arrived to the patient, around [place] the PCCM MD was the at bedside. Patient was on BIPAP, alert, following commands, and did not appear in distress. Skin - very clammy and cool, weak pulse but present. HR in the mid 50s - low 60s. Stable BP.  Oxygen saturations were hard to obtain at times due to skin being cool/clammy. BIPAP 15/10, tolerating well.  ABG was 7.19/41.4/49.6/11.3/15.3 with saturation of 72.6.   MD ordered the following:  -- STAT BMP/LACTIC ACID -- 1 AMP Sodium Bicarb -- Foley  Will leave in SDU for now.

## 2018-03-26 NOTE — Procedures (Signed)
Central Venous Catheter Insertion Procedure Note Trinisha Paget [id number] 1941-05-08  Procedure: Insertion of Central Venous Catheter Indications: Assessment of intravascular volume  Procedure Details Consent: Risks of procedure as well as the alternatives and risks of each were explained to the (patient/caregiver).  Consent for procedure obtained. Time Out: Verified patient identification, verified procedure, site/side was marked, verified correct patient position, special equipment/implants available, medications/allergies/relevent history reviewed, required imaging and test results available.  Performed  Maximum sterile technique was used including antiseptics, cap, gloves, gown, hand hygiene, mask and sheet. Skin prep: Chlorhexidine; local anesthetic administered A antimicrobial bonded/coated triple lumen catheter was placed in the right internal jugular vein using the Seldinger technique.  Ultrasound imaging of the left IJ showed a clot.  Will order RUE ultrasound.  Ultrasound was used to verify the patency of the vein and for real time needle guidance.  Evaluation Blood flow good Complications: No apparent complications Patient did tolerate procedure well. Chest X-ray ordered to verify placement.  CXR: pending.  Simonne Maffucci 03/26/2018, 11:21 AM

## 2018-03-26 NOTE — Progress Notes (Signed)
Critical care/ Rapid Response to the bedside, ABG complete, Bicarb administered, foley inserted for strict I/O. MD stated, "Draw labs, and I am okay with her sats being 88 and higher due to her history". Patient is currently a/o, wearing Bipap, and stable. RN to continue to monitor.

## 2018-03-26 NOTE — Progress Notes (Signed)
NAME:  Heather Mckay, MRN:  [id number], DOB:  1941-04-24, LOS: 2 ADMISSION DATE:  04/01/2018, CONSULTATION DATE:  10/21 REFERRING MD:  Sloan Leiter, CHIEF COMPLAINT:  Dyspnea   Brief History   77 y/o female with multiple medical problems including CHF, pulmonary sarcoidosis was admitted on 10/20 to the hospitalist service with dyspnea, myalgias, cough noted to have a positive RVP for metapneumovirus.    Past Medical History  Sarcoidosis, Pulmonary hypertension, CHF, HTN, HLD, GERD, Asthma, infasive ductal carcinoma, MGUS, OSA on CPAP and on home [facility]   10/20 admit 10/21 PCCM consulted 10/22 early AM intubated, in shock   Consults:  10/21 PCCM   Procedures (surgical and bedside):  10/22 ETT  Significant Diagnostic Tests:  12/2016 TTE > RVSP 69 mmHb, LVEF 60-65%, mod LVH, normal left atrium  Micro Data:  RVP 10/20 >> positive for metapneumovirus  BCx2 10/20 >>  Sputum 10/20 >>   Antimicrobials:  Ceftriaxone 10/20 >> Azithro 10/20 >>  Zosyn/vanc x1 dose 10/21  Subjective:  Hypoxemic Hypothermic Lactic acid rising Blood sugars are elevated Bicarbonate drip running Currently not on sedation Urine output low LFT's elevated  Objective   Blood pressure 96/67, pulse (!) 53, temperature (!) 94.8 F (34.9 C), temperature source Rectal, resp. rate (!) 27, height 5\' 2"  (1.575 m), weight 67.2 kg, SpO2 94 %.    Vent Mode: PRVC FiO2 (%):  [50 %-100 %] 80 % Set Rate:  [15 bmp] 15 bmp Vt Set:  [400 mL] 400 mL PEEP:  [5 cmH20-10 cmH20] 10 cmH20 Plateau Pressure:  [23 cmH20] 23 cmH20   Intake/Output Summary (Last 24 hours) at 03/26/2018 0840 Last data filed at 03/26/2018 0700 Gross per 24 hour  Intake 1839.85 ml  Output 550 ml  Net 1289.85 ml   Filed Weights   03/28/2018 1204 03/10/2018 1602  Weight: 66.2 kg 67.2 kg    Examination:  General:  In bed on vent HENT: NCAT ETT in place PULM: CTA B, vent supported breathing CV: RRR, no mgr GI: BS+,  soft, nontender MSK: normal bulk and tone Neuro: sedated on vent    Resolved Hospital Problem list     Assessment & Plan:  Acute on chronic respiratory failure with hypoxemia > continue full vent support > VAP prevention > increase PEEP, FiO2 to target PaO2 55-65, repeat ABG  CAP due to metapneumovirus > stop vanc/zosyn > continue ceftriaxone/azithro now for fear of bacterial superinfection > low likelihood to stop ceftriaxone/azithro 10/23  OSA  > will need CPAP post extubation  Pulmonary Sarcoidosis  > continue bronchodilators scheduled  AKI > place CVL to measure CVP for volume assessment > LR bolus now > Monitor BMET and UOP > Replace electrolytes as needed  Afib baseline > amiodarone > eliquis  Shock: > presumably cardiogenic given history of pulmonary hypertension > will not administer 30cc/kg because I fear this could be harmful > need to check CVP first > straight leg test today  Disposition / Summary of Today's Plan 03/26/18   Read above     Diet: start tube feeding today Pain/Anxiety/Delirium protocol (if indicated): RASS goal -2 VAP protocol (if indicated): yes DVT prophylaxis: Eiliquis GI prophylaxis: famotidine Hyperglycemia protocol: start SSI q4h Mobility: bed rest Code Status: full Family Communication: update her sons Mitzi Hansen and Jeneen Rinks 10/22 bedside, they desire full code.  I expressed my concern for her overall grave prognosis and explained that I think her likelihood of surviving 30 days is < 20%  Labs  CBC: Recent Labs  Lab 04/04/2018 1150 03/25/18 0447 03/26/18 0541  WBC 8.0 8.7 11.3*  NEUTROABS 6.8 7.3  --   HGB 8.6* 9.1* 8.4*  HCT 27.9* 28.1* 26.3*  MCV 83.8 80.5 79.9*  PLT 96* 96* 116*    Basic Metabolic Panel: Recent Labs  Lab 03/19/2018 1150 03/25/18 0447 03/26/18 0135  NA 135 136 136  K 4.0 4.4 5.2*  CL 104 107 108  CO2 23 19* 12*  GLUCOSE 61* 75 149*  BUN 26* 28* 40*  CREATININE 1.72* 1.70* 2.41*  CALCIUM 9.1  8.6* 8.4*   GFR: Estimated Creatinine Clearance: 17.8 mL/min (A) (by C-G formula based on SCr of 2.41 mg/dL (H)). Recent Labs  Lab 03/05/2018 1150 03/19/2018 1200 03/11/2018 1351 03/25/18 0447 03/26/18 0135 03/26/18 0541  PROCALCITON 0.73  --   --  1.26  --  3.60  WBC 8.0  --   --  8.7  --  11.3*  LATICACIDVEN  --  2.32* 1.41  --  6.5* 7.1*    Liver Function Tests: Recent Labs  Lab 03/09/2018 1150 03/25/18 0447 03/26/18 0541  AST 293* 402* 662*  ALT 211* 262* 319*  ALKPHOS 64 61 101  BILITOT 0.9 1.1 1.5*  PROT 7.7 7.3 6.3*  ALBUMIN 3.3* 3.0* 2.5*   No results for input(s): LIPASE, AMYLASE in the last 168 hours. Recent Labs  Lab 03/26/18 0541  AMMONIA 73*    ABG    Component Value Date/Time   PHART 7.354 03/26/2018 0808   PCO2ART 32.3 03/26/2018 0808   PO2ART 60.2 (L) 03/26/2018 0808   HCO3 17.5 (L) 03/26/2018 0808   TCO2 18 (L) 03/26/2018 0556   ACIDBASEDEF 7.0 (H) 03/26/2018 0808   O2SAT 88.0 03/26/2018 0808     Coagulation Profile: Recent Labs  Lab 03/26/18 0541  INR 2.17    Cardiac Enzymes: Recent Labs  Lab 03/26/18 0541  TROPONINI 0.06*    HbA1C: No results found for: HGBA1C  CBG: Recent Labs  Lab 03/26/18 0206 03/26/18 0316 03/26/18 0453 03/26/18 0746  GLUCAP 231* 186* 220* 322*     Critical care time: [place], MD [place] PCCM Pager: [phone] Cell: [phone] After 3pm or if no response, call [phone]

## 2018-03-26 NOTE — Progress Notes (Addendum)
Pharmacy Antibiotic Note  Heather Mckay is a 77 y.o. female admitted on 03/07/2018 with PNA and AKI, now w/ concern for sepsis.  Pharmacy has been consulted for Vancocin and Zosyn dosing.  Plan: Rec'd vanc 1g 10/21 at 1730; will hold further doses for now given worsening renal function and redose when appropriate. Zosyn 2.25g IV Q8H.  Height: 5\' 2"  (157.5 cm) Weight: 148 lb 2.4 oz (67.2 kg) IBW/kg (Calculated) : 50.1  Temp (24hrs), Avg:97.7 F (36.5 C), Min:95.7 F (35.4 C), Max:98.5 F (36.9 C)  Recent Labs  Lab 04/04/2018 1150 03/16/2018 1200 03/25/2018 1351 03/25/18 0447 03/26/18 0135  WBC 8.0  --   --  8.7  --   CREATININE 1.72*  --   --  1.70* 2.41*  LATICACIDVEN  --  2.32* 1.41  --  6.5*    Estimated Creatinine Clearance: 17.8 mL/min (A) (by C-G formula based on SCr of 2.41 mg/dL (H)).    Allergies  Allergen Reactions  . Hydroxychloroquine Swelling    Patient reported increased swelling of her thyroid gland area. But patient has thyromegaly. Unclear if it is really from Plaquenil or not.  . Hydrocodone     Itching  . Imdur [Isosorbide Dinitrate]     Itching  . Percocet [Oxycodone-Acetaminophen]     Itching    Thank you for allowing pharmacy to be a part of this patient's care.  Wynona Neat, PharmD, BCPS  03/26/2018 4:37 AM

## 2018-03-26 NOTE — Progress Notes (Signed)
Initial Nutrition Assessment  DOCUMENTATION CODES:   Not applicable  INTERVENTION:   Tube Feeding:  Vital High Protein @ 50 ml/hr Provides 1200 kcals, 106 g of protein and 1008 mL of free water Meets 100% protein needs, 94% calorie needs  NUTRITION DIAGNOSIS:   Inadequate oral intake related to acute illness as evidenced by NPO status.  GOAL:   Patient will meet greater than or equal to 90% of their needs   MONITOR:   TF tolerance, Labs, Weight trends, Vent status  REASON FOR ASSESSMENT:   Consult Enteral/tube feeding initiation and management  ASSESSMENT:   77 yo female admitted with acute on chronic respiratory failure requiring intubation on 10/22 with CAP, pulmonary sarcoidosis and OSA; pt also with AKI, in shock, likely cardiogenic. Additional  PMH includes CHF, pulmonary HTN, HLD, GERD, invasive ductal carcinoma   Patient is currently intubated on ventilator support MV: 8.2 L/min Temp (24hrs), Avg:96.5 F (35.8 C), Min:93.8 F (34.3 C), Max:98.5 F (36.9 C)  Labs: CBGs 293-355, BUN 46, Creatinine 2.41, phosphorus 5.1 Meds: vasopressin, levophed, sodium bicarb drip  NUTRITION - FOCUSED PHYSICAL EXAM:  Unable to assess  Diet Order:   Diet Order            Diet NPO time specified  Diet effective now              EDUCATION NEEDS:   Not appropriate for education at this time  Skin:  Skin Assessment: Reviewed RN Assessment  Last BM:  10/20  Height:   Ht Readings from Last 1 Encounters:  03/26/18 5\' 2"  (1.575 m)    Weight:   Wt Readings from Last 1 Encounters:  03/27/2018 67.2 kg    Ideal Body Weight:     BMI:  Body mass index is 27.1 kg/m.  Estimated Nutritional Needs:   Kcal:  1281 kcals   Protein:  100-115 g   Fluid:  >/= 1.5 L   Kerman Passey MS, RD, LDN, CNSC [phone] Pager  [phone] Weekend/On-Call Pager

## 2018-03-26 NOTE — Progress Notes (Signed)
CRITICAL VALUE ALERT  Critical Value:  Lactic 6.5  Date & Time Notied:  03/26/18 0238  Provider Notified: Dr. Hilbert Bible   Orders Received/Actions taken: RN waiting for new orders to be placed. MD notified.

## 2018-03-26 NOTE — Significant Event (Addendum)
PCCM CONSULT NOTE  Called to bedside for Respiratory difficulty ABG: 7.192/41/49/15  On my assessment pt is Alert and awake and responsive Not struggling on BIPAP: Ipap15 Epap 10 MVe 13-14L O2 saturation at the time of my evaluation is 96%  Assessment:  77yr old female with Sarcoidosis, fungal asthma and chronic hypoxic respiratory failure now with Acute Kidney Injury: Non-anion gap metabolic acidosis. She is not in any acute distress and does not have any contraindications to continuing NIPPV.  Plan: Recommend repeat BMP and Lactate Placing Indwelling foley catheter Check urinalysis Avoiding nephrotoxic medications Check Vancomycin random given low GFR prior to further dosing Pt may require bicarb ggt- will give 1 amp post blood draw for BMP Renal consult is recommended If pt continues to decline or becomes unresponsive while on NIPPV pls call PCCM   Signed Dr Seward Carol [facility] Critical Care Locums

## 2018-03-26 NOTE — Progress Notes (Signed)
RT attempted aline without success due to inability to thread catheter.  MD aware.

## 2018-03-26 NOTE — Progress Notes (Signed)
Peripherally Inserted Central Catheter/Midline Placement  The IV Nurse has discussed with the patient and/or persons authorized to consent for the patient, the purpose of this procedure and the potential benefits and risks involved with this procedure.  The benefits include less needle sticks, lab draws from the catheter, and the patient may be discharged home with the catheter. Risks include, but not limited to, infection, bleeding, blood clot (thrombus formation), and puncture of an artery; nerve damage and irregular heartbeat and possibility to perform a PICC exchange if needed/ordered by physician.  Alternatives to this procedure were also discussed.  Bard Power PICC patient education guide, fact sheet on infection prevention and patient information card has been provided to patient /or left at bedside.    PICC/Midline Placement Documentation        Virgilio Belling 03/26/2018, 5:33 AM

## 2018-03-26 NOTE — Progress Notes (Signed)
Patient assessed and noted to have a change in mentation, not following commands, HR 42, BP: 82/60, Temp 95.7. Rapid responds/ Respiratory Therapy/ critical care notified and to the bedside for anticipated intubation. After intubation and stabilization of BP and HR, patient was transferred to 3M05. Family contacted, but unable to answer phone call. Report given to [facility] on [place].

## 2018-03-26 NOTE — Progress Notes (Addendum)
PCCM Interval Note  Called back for new hypotension, progressive bradycardia 50-60's to now 40's and progressive lethargy.    Found on BiPAP agonal and minimally responsive to pain.  Hypotensive with manual SBP in the 70's.  Sinus brady in the 40's.    Given Atropine and 1 amp bicarb while prepping for intubation.  Taken off BiPAP and manually bagged at 100% FiO2.  R EJ placed for secondary PIV access for now.    P:  S/p Emergent intubation by Dr. Gilford Raid Full MV support CXR now, ABG in 1 hr tx to ICU Continue levophed for map > 65 Insert aline Bicarb gtt at 100 ml/hr EKG on arrival to ICU Check cortisol level Hold toprol xl and procardia     Kennieth Rad, AGACNP-BC [place] Pgr: [phone] or if no answer [phone] 03/26/2018, 4:26 AM

## 2018-03-27 ENCOUNTER — Inpatient Hospital Stay (HOSPITAL_COMMUNITY): Payer: Medicare Other

## 2018-03-27 DIAGNOSIS — R945 Abnormal results of liver function studies: Secondary | ICD-10-CM

## 2018-03-27 LAB — HEPARIN LEVEL (UNFRACTIONATED)
Heparin Unfractionated: >2.2 IU/mL — ABNORMAL HIGH (ref 0.30–0.70)
Heparin Unfractionated: >2.2 IU/mL — ABNORMAL HIGH (ref 0.30–0.70)

## 2018-03-27 LAB — COMPREHENSIVE METABOLIC PANEL
ALK PHOS: 100 U/L (ref 38–126)
ALT: 634 U/L — ABNORMAL HIGH (ref 0–44)
ANION GAP: 12 (ref 5–15)
AST: 1456 U/L — ABNORMAL HIGH (ref 15–41)
Albumin: 2.3 g/dL — ABNORMAL LOW (ref 3.5–5.0)
BILIRUBIN TOTAL: 1 mg/dL (ref 0.3–1.2)
BUN: 64 mg/dL — ABNORMAL HIGH (ref 8–23)
CALCIUM: 7.3 mg/dL — AB (ref 8.9–10.3)
CO2: 20 mmol/L — AB (ref 22–32)
Chloride: 104 mmol/L (ref 98–111)
Creatinine, Ser: 2.86 mg/dL — ABNORMAL HIGH (ref 0.44–1.00)
GFR calc Af Amer: 17 mL/min — ABNORMAL LOW (ref >60)
GFR calc non Af Amer: 15 mL/min — ABNORMAL LOW (ref >60)
Glucose, Bld: 252 mg/dL — ABNORMAL HIGH (ref 70–99)
Potassium: 4.7 mmol/L (ref 3.5–5.1)
SODIUM: 136 mmol/L (ref 135–145)
TOTAL PROTEIN: 6.1 g/dL — AB (ref 6.5–8.1)

## 2018-03-27 LAB — LEGIONELLA PNEUMOPHILA SEROGP 1 UR AG: L. pneumophila Serogp 1 Ur Ag: NEGATIVE

## 2018-03-27 LAB — CBC WITH DIFFERENTIAL/PLATELET
Abs Immature Granulocytes: 0.15 10*3/uL — ABNORMAL HIGH (ref 0.00–0.07)
Basophils Absolute: 0 10*3/uL (ref 0.0–0.1)
Basophils Relative: 0 %
Eosinophils Absolute: 0 10*3/uL (ref 0.0–0.5)
Eosinophils Relative: 0 %
HCT: 24.7 % — ABNORMAL LOW (ref 36.0–46.0)
Hemoglobin: 8.1 g/dL — ABNORMAL LOW (ref 12.0–15.0)
Immature Granulocytes: 1 %
Lymphocytes Relative: 5 %
Lymphs Abs: 0.6 10*3/uL — ABNORMAL LOW (ref 0.7–4.0)
MCH: 25.3 pg — ABNORMAL LOW (ref 26.0–34.0)
MCHC: 32.8 g/dL (ref 30.0–36.0)
MCV: 77.2 fL — ABNORMAL LOW (ref 80.0–100.0)
Monocytes Absolute: 0.1 10*3/uL (ref 0.1–1.0)
Monocytes Relative: 1 %
Neutro Abs: 11.2 10*3/uL — ABNORMAL HIGH (ref 1.7–7.7)
Neutrophils Relative %: 93 %
Platelets: 156 10*3/uL (ref 150–400)
RBC: 3.2 MIL/uL — ABNORMAL LOW (ref 3.87–5.11)
RDW: 17 % — ABNORMAL HIGH (ref 11.5–15.5)
WBC: 12.1 10*3/uL — ABNORMAL HIGH (ref 4.0–10.5)
nRBC: 0.6 % — ABNORMAL HIGH (ref 0.0–0.2)

## 2018-03-27 LAB — LACTIC ACID, PLASMA: Lactic Acid, Venous: 3.3 mmol/L (ref 0.5–1.9)

## 2018-03-27 LAB — GLUCOSE, CAPILLARY
GLUCOSE-CAPILLARY: 157 mg/dL — AB (ref 70–99)
Glucose-Capillary: 208 mg/dL — ABNORMAL HIGH (ref 70–99)
Glucose-Capillary: 233 mg/dL — ABNORMAL HIGH (ref 70–99)
Glucose-Capillary: 236 mg/dL — ABNORMAL HIGH (ref 70–99)
Glucose-Capillary: 71 mg/dL (ref 70–99)
Glucose-Capillary: 90 mg/dL (ref 70–99)

## 2018-03-27 LAB — POCT I-STAT 3, ART BLOOD GAS (G3+)
ACID-BASE DEFICIT: 4 mmol/L — AB (ref 0.0–2.0)
ACID-BASE DEFICIT: 10 mmol/L — AB (ref 0.0–2.0)
Bicarbonate: 19.8 mmol/L — ABNORMAL LOW (ref 20.0–28.0)
Bicarbonate: 15 mmol/L — ABNORMAL LOW (ref 20.0–28.0)
O2 SAT: 96 %
O2 Saturation: 93 %
PCO2 ART: 31.1 mmHg — AB (ref 32.0–48.0)
PCO2 ART: 27.9 mmHg — AB (ref 32.0–48.0)
PO2 ART: 69 mmHg — AB (ref 83.0–108.0)
Patient temperature: 98.9
TCO2: 21 mmol/L — AB (ref 22–32)
TCO2: 16 mmol/L — ABNORMAL LOW (ref 22–32)
pH, Arterial: 7.413 (ref 7.350–7.450)
pH, Arterial: 7.34 — ABNORMAL LOW (ref 7.350–7.450)
pO2, Arterial: 79 mmHg — ABNORMAL LOW (ref 83.0–108.0)

## 2018-03-27 LAB — MAGNESIUM
MAGNESIUM: 2 mg/dL (ref 1.7–2.4)
MAGNESIUM: 2 mg/dL (ref 1.7–2.4)

## 2018-03-27 LAB — APTT: aPTT: >200 seconds (ref 24–36)

## 2018-03-27 LAB — PHOSPHORUS
PHOSPHORUS: 3.3 mg/dL (ref 2.5–4.6)
Phosphorus: 3.6 mg/dL (ref 2.5–4.6)

## 2018-03-27 MED ORDER — FUROSEMIDE 10 MG/ML IJ SOLN
40.0000 mg | Freq: Once | INTRAMUSCULAR | Status: AC
  Administered 2018-03-27: 40 mg via INTRAVENOUS
  Filled 2018-03-27: qty 4

## 2018-03-27 MED ORDER — INSULIN GLARGINE 100 UNIT/ML ~~LOC~~ SOLN
10.0000 [IU] | Freq: Every day | SUBCUTANEOUS | Status: DC
  Administered 2018-03-27: 10 [IU] via SUBCUTANEOUS
  Filled 2018-03-27 (×2): qty 0.1

## 2018-03-27 MED ORDER — DEXMEDETOMIDINE HCL IN NACL 400 MCG/100ML IV SOLN
0.4000 ug/kg/h | INTRAVENOUS | Status: DC
  Administered 2018-03-27: 0.8 ug/kg/h via INTRAVENOUS
  Administered 2018-03-27: 1.1 ug/kg/h via INTRAVENOUS
  Administered 2018-03-27: 0.8 ug/kg/h via INTRAVENOUS
  Administered 2018-03-28 – 2018-03-29 (×6): 1.1 ug/kg/h via INTRAVENOUS
  Administered 2018-03-29 (×3): 1.2 ug/kg/h via INTRAVENOUS
  Administered 2018-03-30: 1.202 ug/kg/h via INTRAVENOUS
  Administered 2018-03-30: 1.2 ug/kg/h via INTRAVENOUS
  Administered 2018-03-30 (×2): 1.202 ug/kg/h via INTRAVENOUS
  Administered 2018-03-31 – 2018-04-04 (×26): 1.2 ug/kg/h via INTRAVENOUS
  Administered 2018-04-05 (×2): 1 ug/kg/h via INTRAVENOUS
  Administered 2018-04-05 (×2): 1.2 ug/kg/h via INTRAVENOUS
  Administered 2018-04-06: 0.8 ug/kg/h via INTRAVENOUS
  Administered 2018-04-06 – 2018-04-07 (×7): 1 ug/kg/h via INTRAVENOUS
  Administered 2018-04-08: 1.2 ug/kg/h via INTRAVENOUS
  Administered 2018-04-08: 1 ug/kg/h via INTRAVENOUS
  Filled 2018-03-27 (×53): qty 100
  Filled 2018-03-27: qty 200
  Filled 2018-03-27 (×7): qty 100

## 2018-03-27 MED ORDER — HEPARIN (PORCINE) IN NACL 100-0.45 UNIT/ML-% IJ SOLN: 650.0000 [IU]/h | INTRAMUSCULAR | Status: DC

## 2018-03-27 MED ORDER — HEPARIN (PORCINE) IN NACL 100-0.45 UNIT/ML-% IJ SOLN
850.0000 [IU]/h | INTRAMUSCULAR | Status: DC
  Administered 2018-03-27: 850 [IU]/h via INTRAVENOUS
  Filled 2018-03-27: qty 250

## 2018-03-27 NOTE — Progress Notes (Addendum)
NAME:  Heather Mckay, MRN:  [id number], DOB:  1941-05-05, LOS: 3 ADMISSION DATE:  03/19/2018, CONSULTATION DATE:  10/21 REFERRING MD:  Sloan Leiter, CHIEF COMPLAINT:  Dyspnea   Brief History   77 y/o female with multiple medical problems including CHF, pulmonary sarcoidosis was admitted on 10/20 to the hospitalist service with dyspnea, myalgias, cough noted to have a positive RVP for metapneumovirus.    Past Medical History  Sarcoidosis, Pulmonary hypertension, CHF, HTN, HLD, GERD, Asthma, infasive ductal carcinoma, MGUS, OSA on CPAP and on home [facility]   10/20 admit 10/21 PCCM consulted 10/22 early AM intubated, in shock 10/22 left Brachial segmental DVT   Consults:  10/21 PCCM   Procedures (surgical and bedside):  10/22 ETT  Significant Diagnostic Tests:  12/2016 TTE > RVSP 69 mmHb, LVEF 60-65%, mod LVH, normal left atrium  Micro Data:  RVP 10/20 >> positive for metapneumovirus  BCx2 10/20 >>  Sputum 10/20 >> MRSA nasopharyngeal  Antimicrobials:  Ceftriaxone 10/20 >> Azithro 10/20 >>  Zosyn/vanc x1 dose 10/21  Subjective:  Requiring significant vent support Hypothermic 97.1 overnight 10/22 Lactic acid rising, last 7.1 Blood sugars are elevated (stress dose steroids, infection and tube feed) Bicarbonate drip running Currently not on cont sedation, has needed intermittent to avoid pulling lines Urine output low LFT's elevated and getting worse  Objective   Blood pressure (!) 107/54, pulse 67, temperature (!) 97.1 F (36.2 C), temperature source Axillary, resp. rate (!) 24, height 5\' 2"  (1.575 m), weight 70.9 kg, SpO2 99 %. CVP:  [10 mmHg-13 mmHg] 10 mmHg  Vent Mode: PRVC FiO2 (%):  [80 %-96 %] 80 % Set Rate:  [15 bmp] 15 bmp Vt Set:  [400 mL] 400 mL PEEP:  [12 cmH20] 12 cmH20 Plateau Pressure:  [22 cmH20-28 cmH20] 25 cmH20   Intake/Output Summary (Last 24 hours) at 03/27/2018 1157 Last data filed at 03/27/2018 0800 Gross per 24 hour    Intake 2572.8 ml  Output 305 ml  Net 2267.8 ml   Filed Weights   03/11/2018 1204 03/09/2018 1602 03/27/18 0331  Weight: 66.2 kg 67.2 kg 70.9 kg    Examination:  General:  In bed on vent, sleeping HENT: NCAT ETT in place PULM: course with decreased breath sounds on left, vent supported breathing CV: RRR, no mgr GI: BS diminished, soft belly MSK: normal bulk and tone Neuro: sedated/sleeping on vent  Resolved Hospital Problem list     Assessment & Plan:  Acute on chronic respiratory failure with hypoxemia > continue full vent support > VAP prevention > increase PEEP, FiO2 to target PaO2 55-65, repeat ABG (currently 12 peep with 80% FiO2)  CAP due to metapneumovirus > continue ceftriaxone/azithro now for fear of bacterial superinfection > low likelihood to stop ceftriaxone/azithro 10/23 Tmin 97.1  Brachial left DVT: was on eliquis -will consider this an eliquis failure, d/c eliquis -likely start heparin am 10/23  OSA  > will need CPAP post extubation  Pulmonary Sarcoidosis  > continue bronchodilators scheduled  AKI- worsening, sCr now 2.86 > Monitor BMET and UOP > Replace electrolytes as needed  Afib baseline > amiodarone > eliquis failure with new brachial DVT, will consider heparin.  Potential d/c with warfarin  Anemia: Hgb 8-9s on this admission, appears stable -continue trending CBC -patient on anticoagulation but no indication of severe bleed at this time  Hyperglycemia: >30u novolog 10/22, cbgs still >200 -will add lantus 10u -cont sSSI  Shock: presumably cardiogenic given history of pulmonary hypertension, will  not administer 30cc/kg because of this need to check CVP first Currently on vasopressin and levophed   Disposition / Summary of Today's Plan 03/27/18   Read above    Diet: start tube feeding today Pain/Anxiety/Delirium protocol (if indicated): RASS goal -2 VAP protocol (if indicated): yes DVT prophylaxis: failed eliquis, likely start  heparin GI prophylaxis: famotidine Hyperglycemia protocol: sSSI q4h, will start lantus 10u  Mobility: bed rest Code Status: full  Family Communication: attending updated her sons Mitzi Hansen and Jeneen Rinks 10/22 bedside, they desire full code.  expressed concern for her overall grave prognosis and explained that likelihood of surviving 30 days is < 20%  Labs   CBC: Recent Labs  Lab 03/15/2018 1150 03/25/18 0447 03/26/18 0541 03/27/18 0447  WBC 8.0 8.7 11.3* 12.1*  NEUTROABS 6.8 7.3  --  11.2*  HGB 8.6* 9.1* 8.4* 8.1*  HCT 27.9* 28.1* 26.3* 24.7*  MCV 83.8 80.5 79.9* 77.2*  PLT 96* 96* 116* 301    Basic Metabolic Panel: Recent Labs  Lab 03/23/2018 1150 03/25/18 0447 03/26/18 0135 03/26/18 0957 03/26/18 1745 03/27/18 0447  NA 135 136 136 136  --  136  K 4.0 4.4 5.2* 4.6  --  4.7  CL 104 107 108 104  --  104  CO2 23 19* 12* 16*  --  20*  GLUCOSE 61* 75 149* 382*  --  252*  BUN 26* 28* 40* 46*  --  64*  CREATININE 1.72* 1.70* 2.41* 2.41*  --  2.86*  CALCIUM 9.1 8.6* 8.4* 7.2*  --  7.3*  MG  --   --   --  1.7 1.7 2.0  PHOS  --   --   --  5.1* 3.9 3.6   GFR: Estimated Creatinine Clearance: 15.4 mL/min (A) (by C-G formula based on SCr of 2.86 mg/dL (H)). Recent Labs  Lab 03/12/2018 1150 03/19/2018 1200 03/09/2018 1351 03/25/18 0447 03/26/18 0135 03/26/18 0541 03/27/18 0447  PROCALCITON 0.73  --   --  1.26  --  3.60  --   WBC 8.0  --   --  8.7  --  11.3* 12.1*  LATICACIDVEN  --  2.32* 1.41  --  6.5* 7.1*  --     Liver Function Tests: Recent Labs  Lab 03/22/2018 1150 03/25/18 0447 03/26/18 0541 03/26/18 0957 03/27/18 0447  AST 293* 402* 662*  --  1,456*  ALT 211* 262* 319*  --  634*  ALKPHOS 64 61 101  --  100  BILITOT 0.9 1.1 1.5*  --  1.0  PROT 7.7 7.3 6.3*  --  6.1*  ALBUMIN 3.3* 3.0* 2.5* 2.3* 2.3*   No results for input(s): LIPASE, AMYLASE in the last 168 hours. Recent Labs  Lab 03/26/18 0541  AMMONIA 73*    ABG    Component Value Date/Time   PHART 7.413  03/27/2018 0333   PCO2ART 31.1 (L) 03/27/2018 0333   PO2ART 79.0 (L) 03/27/2018 0333   HCO3 19.8 (L) 03/27/2018 0333   TCO2 21 (L) 03/27/2018 0333   ACIDBASEDEF 4.0 (H) 03/27/2018 0333   O2SAT 96.0 03/27/2018 0333     Coagulation Profile: Recent Labs  Lab 03/26/18 0541  INR 2.17    Cardiac Enzymes: Recent Labs  Lab 03/26/18 0541 03/26/18 0957 03/26/18 1745  TROPONINI 0.06* 0.06* 0.07*    HbA1C: *6.06 December 2016 No results found for: HGBA1C  CBG: Recent Labs  Lab 03/26/18 1558 03/26/18 1932 03/26/18 2334 03/27/18 0320 03/27/18 0732  GLUCAP 267* 239* 220* 236*  233*     Critical care time:     Sherene Sires, [place] 03/27/2018 9:28 AM

## 2018-03-27 NOTE — Progress Notes (Signed)
Patients oxygenation saturation on the monitor fluctuating significantly. Sensor changed/repositioned multiple times with no change. Can not get an appropriate waveform.  ABG done by RT showed oxygen level at 93% at this time. Bicarb dropped to 15 from 19.8 on the previous ABG.  MD McQuaid made aware.

## 2018-03-27 NOTE — Progress Notes (Signed)
ANTICOAGULATION CONSULT NOTE - Follow Up Consult  Pharmacy Consult for Heparin Indication: segmental thrombus of left brachial vein and h/o afib  Allergies  Allergen Reactions  . Hydroxychloroquine Swelling    Patient reported increased swelling of her thyroid gland area. But patient has thyromegaly. Unclear if it is really from Plaquenil or not.  . Hydrocodone     Itching  . Imdur [Isosorbide Dinitrate]     Itching  . Percocet [Oxycodone-Acetaminophen]     Itching    Patient Measurements: Height: 5\' 2"  (157.5 cm) Weight: 156 lb 4.9 oz (70.9 kg) IBW/kg (Calculated) : 50.1 Heparin Dosing Weight:   Vital Signs: Temp: 98.5 F (36.9 C) (10/23 1949) Temp Source: Oral (10/23 1949) BP: 106/49 (10/23 2200) Pulse Rate: 57 (10/23 2200)  Labs: Recent Labs    03/25/18 0447 03/26/18 0135 03/26/18 0541 03/26/18 0957 03/26/18 1745 03/27/18 0447 03/27/18 1823 03/27/18 2103  HGB 9.1*  --  8.4*  --   --  8.1*  --   --   HCT 28.1*  --  26.3*  --   --  24.7*  --   --   PLT 96*  --  116*  --   --  156  --   --   APTT  --   --   --   --   --   --  >200* >200*  LABPROT  --   --  23.9*  --   --   --   --   --   INR  --   --  2.17  --   --   --   --   --   HEPARINUNFRC  --   --   --   --   --   --  >2.20*  --   CREATININE 1.70* 2.41*  --  2.41*  --  2.86*  --   --   TROPONINI  --   --  0.06* 0.06* 0.07*  --   --   --     Estimated Creatinine Clearance: 15.4 mL/min (A) (by C-G formula based on SCr of 2.86 mg/dL (H)).   Assessment: Anticoag: Afib: PTA Eliquis, Amiodarone; Chadsvasc 7 *10/22 +segmental thrombus of left brachial vein  APTT>200 and HL>2.2 (drawn from femoral A-line, heparin infusing in hand), repeat aPTT still >200.   Goal of Therapy:  aPTT 66-102 seconds  HL 0.3-0.7 Monitor platelets by anticoagulation protocol: Yes   Plan:  **Hold IV heparin x 1 hour and decrease to 650 units/hr** Recheck aPTT, HL and CBC in AM and daily.  Nyliah Nierenberg S. Alford Highland, PharmD,  BCPS Clinical Staff Pharmacist Pager [phone]  Eilene Ghazi Stillinger 03/27/2018,10:35 PM

## 2018-03-27 NOTE — Progress Notes (Addendum)
ANTICOAGULATION CONSULT NOTE - Initial Consult  Pharmacy Consult for Heparin Indication: atrial fibrillation  Allergies  Allergen Reactions  . Hydroxychloroquine Swelling    Patient reported increased swelling of her thyroid gland area. But patient has thyromegaly. Unclear if it is really from Plaquenil or not.  . Hydrocodone     Itching  . Imdur [Isosorbide Dinitrate]     Itching  . Percocet [Oxycodone-Acetaminophen]     Itching    Patient Measurements: Height: 5\' 2"  (157.5 cm) Weight: 156 lb 4.9 oz (70.9 kg) IBW/kg (Calculated) : 50.1 Heparin Dosing Weight: 65.1 kg  Vital Signs: Temp: 97.1 F (36.2 C) (10/23 0736) Temp Source: Axillary (10/23 0736) BP: 118/59 (10/23 1000) Pulse Rate: 72 (10/23 1000)  Labs: Recent Labs    03/25/18 0447 03/26/18 0135 03/26/18 0541 03/26/18 0957 03/26/18 1745 03/27/18 0447  HGB 9.1*  --  8.4*  --   --  8.1*  HCT 28.1*  --  26.3*  --   --  24.7*  PLT 96*  --  116*  --   --  156  LABPROT  --   --  23.9*  --   --   --   INR  --   --  2.17  --   --   --   CREATININE 1.70* 2.41*  --  2.41*  --  2.86*  TROPONINI  --   --  0.06* 0.06* 0.07*  --     Estimated Creatinine Clearance: 15.4 mL/min (A) (by C-G formula based on SCr of 2.86 mg/dL (H)).   Medical History: Past Medical History:  Diagnosis Date  . Asthma   . CHF (congestive heart failure) ([place])   . GERD (gastroesophageal reflux disease)   . HLD (hyperlipidemia)   . Hypertension    Assessment: Patient is a 12 yoF with a history of atrial fibrillation on Eliquis PTA, last dose around 2200 yesterday evening 03/26/18. Due to patient's elevated Scr at 2.86 mg/dL, Eliquis was discontinued and Pharmacy has been consulted to start heparin in the interim. Of note, platelets are 156, HgB is low at 8.1, and LFTs are >5x ULN. No overt signs of bleed per RN.   Noted documentation of brachial vein segment clot that is not an acute DVT.   Goal of Therapy:  Heparin level 0.3-0.7  units/ml aPTT 66-102 seconds Monitor platelets by anticoagulation protocol: Yes   Plan:  Start heparin infusion at 850 units/hr, no bolus Check anti-Xa and aPTT in 8 hours  Monitor CBC and signs of bleeding   Willia Craze, Pharmacy Student   Remigio Eisenmenger D. Mina Marble, PharmD, BCPS, [place] 03/27/2018, 11:52 AM

## 2018-03-27 NOTE — Progress Notes (Signed)
CRITICAL VALUE ALERT  Critical Value:  PTT >200  Date & Time Notied:  10/23  2234  Provider Notified: Warren Lacy and Pharmacy   Orders Received/Actions taken:

## 2018-03-28 ENCOUNTER — Inpatient Hospital Stay (HOSPITAL_COMMUNITY): Payer: Medicare Other

## 2018-03-28 LAB — HEPATIC FUNCTION PANEL
ALK PHOS: 112 U/L (ref 38–126)
ALT: 631 U/L — AB (ref 0–44)
AST: 1257 U/L — ABNORMAL HIGH (ref 15–41)
Albumin: 2.4 g/dL — ABNORMAL LOW (ref 3.5–5.0)
BILIRUBIN DIRECT: 0.3 mg/dL — AB (ref 0.0–0.2)
BILIRUBIN INDIRECT: 0.6 mg/dL (ref 0.3–0.9)
BILIRUBIN TOTAL: 0.9 mg/dL (ref 0.3–1.2)
Total Protein: 5.9 g/dL — ABNORMAL LOW (ref 6.5–8.1)

## 2018-03-28 LAB — CBC
HEMATOCRIT: 22.1 % — AB (ref 36.0–46.0)
HEMOGLOBIN: 7.5 g/dL — AB (ref 12.0–15.0)
MCH: 25.5 pg — AB (ref 26.0–34.0)
MCHC: 33.9 g/dL (ref 30.0–36.0)
MCV: 75.2 fL — AB (ref 80.0–100.0)
Platelets: 121 10*3/uL — ABNORMAL LOW (ref 150–400)
RBC: 2.94 MIL/uL — AB (ref 3.87–5.11)
RDW: 16.7 % — ABNORMAL HIGH (ref 11.5–15.5)
WBC: 14.5 10*3/uL — ABNORMAL HIGH (ref 4.0–10.5)
nRBC: 0.2 % (ref 0.0–0.2)

## 2018-03-28 LAB — BLOOD GAS, ARTERIAL
Acid-base deficit: 1.9 mmol/L (ref 0.0–2.0)
Bicarbonate: 21.4 mmol/L (ref 20.0–28.0)
DRAWN BY: 51133
FIO2: 80
MECHVT: 400 mL
O2 SAT: 98.4 %
PATIENT TEMPERATURE: 99.7
PEEP/CPAP: 12 cmH2O
RATE: 15 resp/min
pCO2 arterial: 31.4 mmHg — ABNORMAL LOW (ref 32.0–48.0)
pH, Arterial: 7.45 (ref 7.350–7.450)
pO2, Arterial: 135 mmHg — ABNORMAL HIGH (ref 83.0–108.0)

## 2018-03-28 LAB — CULTURE, RESPIRATORY W GRAM STAIN: Culture: NO GROWTH

## 2018-03-28 LAB — CULTURE, RESPIRATORY

## 2018-03-28 LAB — GLUCOSE, CAPILLARY
GLUCOSE-CAPILLARY: 153 mg/dL — AB (ref 70–99)
Glucose-Capillary: 138 mg/dL — ABNORMAL HIGH (ref 70–99)
Glucose-Capillary: 166 mg/dL — ABNORMAL HIGH (ref 70–99)
Glucose-Capillary: 219 mg/dL — ABNORMAL HIGH (ref 70–99)
Glucose-Capillary: 237 mg/dL — ABNORMAL HIGH (ref 70–99)
Glucose-Capillary: 86 mg/dL (ref 70–99)

## 2018-03-28 LAB — BASIC METABOLIC PANEL
ANION GAP: 13 (ref 5–15)
BUN: 77 mg/dL — ABNORMAL HIGH (ref 8–23)
CO2: 20 mmol/L — AB (ref 22–32)
Calcium: 7.2 mg/dL — ABNORMAL LOW (ref 8.9–10.3)
Chloride: 106 mmol/L (ref 98–111)
Creatinine, Ser: 2.99 mg/dL — ABNORMAL HIGH (ref 0.44–1.00)
GFR calc Af Amer: 16 mL/min — ABNORMAL LOW (ref >60)
GFR calc non Af Amer: 14 mL/min — ABNORMAL LOW (ref >60)
Glucose, Bld: 125 mg/dL — ABNORMAL HIGH (ref 70–99)
POTASSIUM: 3.9 mmol/L (ref 3.5–5.1)
Sodium: 139 mmol/L (ref 135–145)

## 2018-03-28 LAB — APTT
aPTT: >200 seconds (ref 24–36)
aPTT: >200 seconds (ref 24–36)
aPTT: >200 seconds (ref 24–36)

## 2018-03-28 LAB — PROTIME-INR
INR: 1.89
Prothrombin Time: 21.4 seconds — ABNORMAL HIGH (ref 11.4–15.2)

## 2018-03-28 LAB — HEPARIN LEVEL (UNFRACTIONATED): Heparin Unfractionated: >2.2 IU/mL — ABNORMAL HIGH (ref 0.30–0.70)

## 2018-03-28 MED ORDER — FUROSEMIDE 10 MG/ML IJ SOLN
80.0000 mg | Freq: Once | INTRAMUSCULAR | Status: AC
  Administered 2018-03-28: 80 mg via INTRAVENOUS
  Filled 2018-03-28: qty 8

## 2018-03-28 MED ORDER — FENTANYL 2500MCG IN NS 250ML (10MCG/ML) PREMIX INFUSION
0.0000 ug/h | INTRAVENOUS | Status: DC
  Administered 2018-03-28: 25 ug/h via INTRAVENOUS
  Administered 2018-03-28: 225 ug/h via INTRAVENOUS
  Administered 2018-03-29: 125 ug/h via INTRAVENOUS
  Administered 2018-03-29: 275 ug/h via INTRAVENOUS
  Administered 2018-03-30: 300 ug/h via INTRAVENOUS
  Filled 2018-03-28 (×5): qty 250

## 2018-03-28 MED ORDER — HEPARIN (PORCINE) IN NACL 100-0.45 UNIT/ML-% IJ SOLN
450.0000 [IU]/h | INTRAMUSCULAR | Status: DC
  Administered 2018-03-28: 450 [IU]/h via INTRAVENOUS

## 2018-03-28 MED ORDER — INSULIN GLARGINE 100 UNIT/ML ~~LOC~~ SOLN
7.0000 [IU] | Freq: Every day | SUBCUTANEOUS | Status: DC
  Administered 2018-03-28 – 2018-03-29 (×2): 7 [IU] via SUBCUTANEOUS
  Filled 2018-03-28 (×3): qty 0.07

## 2018-03-28 MED FILL — Medication: Qty: 1 | Status: AC

## 2018-03-28 NOTE — Progress Notes (Signed)
[place] for Heparin Indication: atrial fibrillation  Allergies  Allergen Reactions  . Hydroxychloroquine Swelling    Patient reported increased swelling of her thyroid gland area. But patient has thyromegaly. Unclear if it is really from Plaquenil or not.  . Hydrocodone     Itching  . Imdur [Isosorbide Dinitrate]     Itching  . Percocet [Oxycodone-Acetaminophen]     Itching    Patient Measurements: Height: 5\' 2"  (157.5 cm) Weight: 160 lb 4.4 oz (72.7 kg) IBW/kg (Calculated) : 50.1 Heparin Dosing Weight: 65.1 kg  Vital Signs: Temp: 99.7 F (37.6 C) (10/24 0341) Temp Source: Axillary (10/24 0341) BP: 116/53 (10/24 0600) Pulse Rate: 63 (10/24 0600)  Labs: Recent Labs    03/26/18 0541 03/26/18 0957 03/26/18 1745 03/27/18 0447 03/27/18 1823 03/27/18 2103 03/28/18 0453 03/28/18 0454  HGB 8.4*  --   --  8.1*  --   --   --  7.5*  HCT 26.3*  --   --  24.7*  --   --   --  22.1*  PLT 116*  --   --  156  --   --   --  121*  APTT  --   --   --   --  >200* >200* >200*  --   LABPROT 23.9*  --   --   --   --   --   --   --   INR 2.17  --   --   --   --   --   --   --   HEPARINUNFRC  --   --   --   --  >2.20* >2.20* >2.20*  --   CREATININE  --  2.41*  --  2.86*  --   --   --  2.99*  TROPONINI 0.06* 0.06* 0.07*  --   --   --   --   --     Estimated Creatinine Clearance: 14.9 mL/min (A) (by C-G formula based on SCr of 2.99 mg/dL (H)).   Medical History: Past Medical History:  Diagnosis Date  . Asthma   . CHF (congestive heart failure) ([place])   . GERD (gastroesophageal reflux disease)   . HLD (hyperlipidemia)   . Hypertension    Assessment: Patient is a 56 yoF with a history of atrial fibrillation on Eliquis PTA, last dose around 2200 yesterday evening 03/26/18. Due to patient's elevated Scr at 2.86 mg/dL, Eliquis was discontinued and Pharmacy has been consulted to start heparin in the interim. Of note, platelets are 156, HgB is low  at 8.1, and LFTs are >5x ULN. No overt signs of bleed per RN.   Noted documentation of brachial vein segment clot that is not an acute DVT.  10/24 AM update: heparin level is >2.2 (likely from apixaban use). However, aPTT is also markedly elevated despite holding an rate decrease. Verified that heparin is not running through the line that labs were drawn from.    Goal of Therapy:  Heparin level 0.3-0.7 units/ml aPTT 66-102 seconds Monitor platelets by anticoagulation protocol: Yes   Plan:  Hold heparin x 2 hours Re-start heparin drip at 450 units/hr at 0930 1530 aPTT/HL  Narda Bonds, PharmD, [place] Pharmacist Phone: [phone]

## 2018-03-28 NOTE — Progress Notes (Signed)
Pt has moderate amount of pink tinged secretions filling up HME and ballard. Changed and replaced with new.

## 2018-03-28 NOTE — Progress Notes (Signed)
ANTICOAGULATION CONSULT NOTE - Follow Up Consult  Pharmacy Consult for Heparin Indication: segmental thrombus of left brachial vein and h/o afib  Allergies  Allergen Reactions  . Hydroxychloroquine Swelling    Patient reported increased swelling of her thyroid gland area. But patient has thyromegaly. Unclear if it is really from Plaquenil or not.  . Hydrocodone     Itching  . Imdur [Isosorbide Dinitrate]     Itching  . Percocet [Oxycodone-Acetaminophen]     Itching    Patient Measurements: Height: 5\' 2"  (157.5 cm) Weight: 160 lb 4.4 oz (72.7 kg) IBW/kg (Calculated) : 50.1 Heparin Dosing Weight:   Vital Signs: Temp: 96.6 F (35.9 C) (10/24 2000) Temp Source: Axillary (10/24 2000) BP: 98/47 (10/24 2000) Pulse Rate: 60 (10/24 2000)  Labs: Recent Labs    03/26/18 0541 03/26/18 0957 03/26/18 1745 03/27/18 0447  03/27/18 2103 03/28/18 0453 03/28/18 0454 03/28/18 1530 03/28/18 1805  HGB 8.4*  --   --  8.1*  --   --   --  7.5*  --   --   HCT 26.3*  --   --  24.7*  --   --   --  22.1*  --   --   PLT 116*  --   --  156  --   --   --  121*  --   --   APTT  --   --   --   --    < > >200* >200*  --  >200* >200*  LABPROT 23.9*  --   --   --   --   --   --   --  21.4*  --   INR 2.17  --   --   --   --   --   --   --  1.89  --   HEPARINUNFRC  --   --   --   --    < > >2.20* >2.20*  --  >2.20*  --   CREATININE  --  2.41*  --  2.86*  --   --   --  2.99*  --   --   TROPONINI 0.06* 0.06* 0.07*  --   --   --   --   --   --   --    < > = values in this interval not displayed.    Estimated Creatinine Clearance: 14.9 mL/min (A) (by C-G formula based on SCr of 2.99 mg/dL (H)).   Assessment: Anticoag: Afib: PTA Eliquis, Amiodarone; Chadsvasc 7 *10/22 +segmental thrombus of left brachial vein  Heparin in L hand peripheral. Has been drawn from L femoral A line with aPTT>200 and HL>2.2 consistently over 24 hrs now. Try heparin level from L neck central line. APTT still>200.     Goal of Therapy:  aPTT 66-102 seconds  HL 0.3-0.7 Monitor platelets by anticoagulation protocol: Yes   Plan:  Hold IV heparin and recheck level after 1 hrs. Resume when aPTT starts trending down.   Recheck aPTT, HL and CBC in AM and daily.  Kaydance Bowie S. Alford Highland, PharmD, [place] Clinical Staff Pharmacist Pager [phone]  Eilene Ghazi Stillinger 03/28/2018,8:42 PM

## 2018-03-28 NOTE — Progress Notes (Addendum)
NAME:  Heather Mckay, MRN:  [id number], DOB:  November 26, 1940, LOS: 4 ADMISSION DATE:  03/13/2018, CONSULTATION DATE:  10/21 REFERRING MD:  Sloan Leiter, CHIEF COMPLAINT:  Dyspnea   Brief History   77 y/o female with multiple medical problems including CHF, pulmonary sarcoidosis was admitted on 10/20 to the hospitalist service with dyspnea, myalgias, cough noted to have a positive RVP for metapneumovirus.    Past Medical History  Sarcoidosis, Pulmonary hypertension, CHF, HTN, HLD, GERD, Asthma, infasive ductal carcinoma, MGUS, OSA on CPAP and on home [facility]   10/20 admit 10/21 PCCM consulted 10/22 early AM intubated, in shock  Consults:  10/21 PCCM   Procedures (surgical and bedside):  10/22 ETT  Significant Diagnostic Tests:  12/2016 TTE > RVSP 69 mmHb, LVEF 60-65%, mod LVH, normal left atrium  Micro Data:  RVP 10/20 >> positive for metapneumovirus  BCx2 10/20 >>  Sputum 10/20 >> MRSA pos  Antimicrobials:  Ceftriaxone 10/20 >> Azithro 10/20 >>  Zosyn/vanc x1 dose 10/21  Subjective:  Lactic acid decreased to 3.3 Blood sugars now <100, will lower lantus to 7u from 10u Urine output improving, sCr worsening (2.99) LFT's elevated  Objective   Blood pressure (!) 103/51, pulse 64, temperature 99.7 F (37.6 C), temperature source Axillary, resp. rate (!) 21, height 5\' 2"  (1.575 m), weight 72.7 kg, SpO2 92 %. CVP:  [6 mmHg-12 mmHg] 10 mmHg  Vent Mode: PRVC FiO2 (%):  [80 %] 80 % Set Rate:  [15 bmp] 15 bmp Vt Set:  [400 mL] 400 mL PEEP:  [12 cmH20] 12 cmH20   Intake/Output Summary (Last 24 hours) at 03/28/2018 0847 Last data filed at 03/28/2018 0800 Gross per 24 hour  Intake 2343.57 ml  Output 800 ml  Net 1543.57 ml   Filed Weights   04/04/2018 1602 03/27/18 0331 03/28/18 0459  Weight: 67.2 kg 70.9 kg 72.7 kg    Examination:  General:  In bed on vent HENT: NCAT ETT in place PULM: CTA B, vent supported breathing CV: RRR, no mgr GI: BS+, soft,  nontender MSK: normal bulk and tone Neuro: sedated on vent    Resolved Hospital Problem list     Assessment & Plan:  Acute on chronic respiratory failure with hypoxemia > continue full vent support > VAP prevention > titrate PEEP, FiO2 to target PaO2 55-65  CAP due to metapneumovirus > stop vanc/zosyn > continue ceftriaxone/azithro now for fear of bacterial superinfection > low likelihood to stop ceftriaxone/azithro 10/23  OSA  > will need CPAP post extubation  Pulmonary Sarcoidosis  > continue bronchodilators scheduled  AKI: CVL to measure CVP for volume assessment, urine output improving but sCr not > LR bolus now > Monitor BMET and UOP > Replace electrolytes as needed  Transaminitis: add hepatic function lab pending 10/24 -question as to this effecting elevated aPTT  Elevated aPTT: >200 overnight 10/23 -held heparin temporarily, will trend -will follow liver function  Afib baseline: currently rate controlled > d/c'd amiodarone > d/c'd eliquis  Shock: > presumably cardiogenic given history of pulmonary hypertension > will not administer 30cc/kg because I fear this could be harmful   Disposition / Summary of Today's Plan 03/28/18   Read above    Diet: start tube feeding today Pain/Anxiety/Delirium protocol (if indicated): RASS goal -2 VAP protocol (if indicated): yes DVT prophylaxis: heparin GI prophylaxis: famotidine Hyperglycemia protocol: cont SSI q4h, will decrease lantus to 7 Mobility: bed rest Code Status: full Family Communication: attending updated her sons Mitzi Hansen and  Jeneen Rinks 10/22 bedside, they desire full code.  Attending expressed concern for her overall grave prognosis and explained they think her likelihood of surviving 30 days is < 20%  Labs   CBC: Recent Labs  Lab 03/25/2018 1150 03/25/18 0447 03/26/18 0541 03/27/18 0447 03/28/18 0454  WBC 8.0 8.7 11.3* 12.1* 14.5*  NEUTROABS 6.8 7.3  --  11.2*  --   HGB 8.6* 9.1* 8.4* 8.1* 7.5*    HCT 27.9* 28.1* 26.3* 24.7* 22.1*  MCV 83.8 80.5 79.9* 77.2* 75.2*  PLT 96* 96* 116* 156 121*    Basic Metabolic Panel: Recent Labs  Lab 03/25/18 0447 03/26/18 0135 03/26/18 0957 03/26/18 1745 03/27/18 0447 03/27/18 1512 03/28/18 0454  NA 136 136 136  --  136  --  139  K 4.4 5.2* 4.6  --  4.7  --  3.9  CL 107 108 104  --  104  --  106  CO2 19* 12* 16*  --  20*  --  20*  GLUCOSE 75 149* 382*  --  252*  --  125*  BUN 28* 40* 46*  --  64*  --  77*  CREATININE 1.70* 2.41* 2.41*  --  2.86*  --  2.99*  CALCIUM 8.6* 8.4* 7.2*  --  7.3*  --  7.2*  MG  --   --  1.7 1.7 2.0 2.0  --   PHOS  --   --  5.1* 3.9 3.6 3.3  --    GFR: Estimated Creatinine Clearance: 14.9 mL/min (A) (by C-G formula based on SCr of 2.99 mg/dL (H)). Recent Labs  Lab 03/26/2018 1150  03/10/2018 1351 03/25/18 0447 03/26/18 0135 03/26/18 0541 03/27/18 0447 03/27/18 1542 03/28/18 0454  PROCALCITON 0.73  --   --  1.26  --  3.60  --   --   --   WBC 8.0  --   --  8.7  --  11.3* 12.1*  --  14.5*  LATICACIDVEN  --    < > 1.41  --  6.5* 7.1*  --  3.3*  --    < > = values in this interval not displayed.    Liver Function Tests: Recent Labs  Lab 03/22/2018 1150 03/25/18 0447 03/26/18 0541 03/26/18 0957 03/27/18 0447  AST 293* 402* 662*  --  1,456*  ALT 211* 262* 319*  --  634*  ALKPHOS 64 61 101  --  100  BILITOT 0.9 1.1 1.5*  --  1.0  PROT 7.7 7.3 6.3*  --  6.1*  ALBUMIN 3.3* 3.0* 2.5* 2.3* 2.3*   No results for input(s): LIPASE, AMYLASE in the last 168 hours. Recent Labs  Lab 03/26/18 0541  AMMONIA 73*    ABG    Component Value Date/Time   PHART 7.450 03/28/2018 0415   PCO2ART 31.4 (L) 03/28/2018 0415   PO2ART 135 (H) 03/28/2018 0415   HCO3 21.4 03/28/2018 0415   TCO2 16 (L) 03/27/2018 1150   ACIDBASEDEF 1.9 03/28/2018 0415   O2SAT 98.4 03/28/2018 0415     Coagulation Profile: Recent Labs  Lab 03/26/18 0541  INR 2.17    Cardiac Enzymes: Recent Labs  Lab 03/26/18 0541  03/26/18 0957 03/26/18 1745  TROPONINI 0.06* 0.06* 0.07*    HbA1C: No results found for: HGBA1C  CBG: Recent Labs  Lab 03/27/18 1110 03/27/18 1506 03/27/18 1923 03/27/18 2342 03/28/18 0320  GLUCAP 208* 157* 90 71 86     Critical care time:     Sherene Sires, DO  [place] 03/28/2018 8:59 AM

## 2018-03-29 LAB — CBC
HCT: 25.1 % — ABNORMAL LOW (ref 36.0–46.0)
HEMATOCRIT: 19.7 % — AB (ref 36.0–46.0)
HEMATOCRIT: 23.6 % — AB (ref 36.0–46.0)
HEMOGLOBIN: 8.1 g/dL — AB (ref 12.0–15.0)
HEMOGLOBIN: 7.6 g/dL — AB (ref 12.0–15.0)
Hemoglobin: 6.5 g/dL — CL (ref 12.0–15.0)
MCH: 25.6 pg — AB (ref 26.0–34.0)
MCH: 26 pg (ref 26.0–34.0)
MCH: 25.7 pg — ABNORMAL LOW (ref 26.0–34.0)
MCHC: 33 g/dL (ref 30.0–36.0)
MCHC: 32.3 g/dL (ref 30.0–36.0)
MCHC: 32.2 g/dL (ref 30.0–36.0)
MCV: 77.6 fL — AB (ref 80.0–100.0)
MCV: 80.4 fL (ref 80.0–100.0)
MCV: 79.7 fL — AB (ref 80.0–100.0)
Platelets: 94 10*3/uL — ABNORMAL LOW (ref 150–400)
Platelets: 109 10*3/uL — ABNORMAL LOW (ref 150–400)
Platelets: 114 10*3/uL — ABNORMAL LOW (ref 150–400)
RBC: 2.54 MIL/uL — AB (ref 3.87–5.11)
RBC: 3.12 MIL/uL — ABNORMAL LOW (ref 3.87–5.11)
RBC: 2.96 MIL/uL — ABNORMAL LOW (ref 3.87–5.11)
RDW: 17.1 % — ABNORMAL HIGH (ref 11.5–15.5)
RDW: 17 % — AB (ref 11.5–15.5)
RDW: 16.9 % — AB (ref 11.5–15.5)
WBC: 9.1 10*3/uL (ref 4.0–10.5)
WBC: 13.2 10*3/uL — ABNORMAL HIGH (ref 4.0–10.5)
WBC: 10.4 10*3/uL (ref 4.0–10.5)
nRBC: 0.2 % (ref 0.0–0.2)
nRBC: 0.4 % — ABNORMAL HIGH (ref 0.0–0.2)
nRBC: 0.3 % — ABNORMAL HIGH (ref 0.0–0.2)

## 2018-03-29 LAB — COMPREHENSIVE METABOLIC PANEL
ALK PHOS: 105 U/L (ref 38–126)
ALT: 494 U/L — AB (ref 0–44)
AST: 787 U/L — ABNORMAL HIGH (ref 15–41)
Albumin: 2.3 g/dL — ABNORMAL LOW (ref 3.5–5.0)
Anion gap: 14 (ref 5–15)
BUN: 82 mg/dL — ABNORMAL HIGH (ref 8–23)
CALCIUM: 6.9 mg/dL — AB (ref 8.9–10.3)
CO2: 23 mmol/L (ref 22–32)
Chloride: 107 mmol/L (ref 98–111)
Creatinine, Ser: 2.86 mg/dL — ABNORMAL HIGH (ref 0.44–1.00)
GFR, EST AFRICAN AMERICAN: 17 mL/min — AB (ref >60)
GFR, EST NON AFRICAN AMERICAN: 15 mL/min — AB (ref >60)
Glucose, Bld: 190 mg/dL — ABNORMAL HIGH (ref 70–99)
Potassium: 3.5 mmol/L (ref 3.5–5.1)
Sodium: 144 mmol/L (ref 135–145)
Total Bilirubin: 0.6 mg/dL (ref 0.3–1.2)
Total Protein: 6 g/dL — ABNORMAL LOW (ref 6.5–8.1)

## 2018-03-29 LAB — POCT I-STAT 3, ART BLOOD GAS (G3+)
Acid-base deficit: 4 mmol/L — ABNORMAL HIGH (ref 0.0–2.0)
BICARBONATE: 22.1 mmol/L (ref 20.0–28.0)
O2 SAT: 89 %
Patient temperature: 97.5
TCO2: 24 mmol/L (ref 22–32)
pCO2 arterial: 46.5 mmHg (ref 32.0–48.0)
pH, Arterial: 7.283 — ABNORMAL LOW (ref 7.350–7.450)
pO2, Arterial: 61 mmHg — ABNORMAL LOW (ref 83.0–108.0)

## 2018-03-29 LAB — GLUCOSE, CAPILLARY
GLUCOSE-CAPILLARY: 160 mg/dL — AB (ref 70–99)
GLUCOSE-CAPILLARY: 190 mg/dL — AB (ref 70–99)
GLUCOSE-CAPILLARY: 163 mg/dL — AB (ref 70–99)
Glucose-Capillary: 175 mg/dL — ABNORMAL HIGH (ref 70–99)
Glucose-Capillary: 181 mg/dL — ABNORMAL HIGH (ref 70–99)

## 2018-03-29 LAB — APTT
APTT: 125 s — AB (ref 24–36)
aPTT: 88 seconds — ABNORMAL HIGH (ref 24–36)

## 2018-03-29 LAB — CULTURE, BLOOD (ROUTINE X 2)
CULTURE: NO GROWTH
Culture: NO GROWTH
Special Requests: ADEQUATE

## 2018-03-29 LAB — ABO/RH: ABO/RH(D): O POS

## 2018-03-29 LAB — HEPARIN LEVEL (UNFRACTIONATED): Heparin Unfractionated: >2.2 IU/mL — ABNORMAL HIGH (ref 0.30–0.70)

## 2018-03-29 LAB — PREPARE RBC (CROSSMATCH)

## 2018-03-29 MED ORDER — FUROSEMIDE 10 MG/ML IJ SOLN
80.0000 mg | Freq: Once | INTRAMUSCULAR | Status: AC
  Administered 2018-03-29: 80 mg via INTRAVENOUS
  Filled 2018-03-29: qty 8

## 2018-03-29 MED ORDER — SODIUM CHLORIDE 0.9% IV SOLUTION
Freq: Once | INTRAVENOUS | Status: AC
  Administered 2018-03-29: 10:00:00 via INTRAVENOUS

## 2018-03-29 NOTE — Progress Notes (Signed)
Nutrition Follow-up  DOCUMENTATION CODES:   Not applicable  INTERVENTION:  Tube Feeding: Vital High Protein @ 80m/hr Provides 1200 kcals, 106 g of protein and 1008 mL of free water Meets 100% protein needs, 94% calorie needs  NUTRITION DIAGNOSIS:   Inadequate oral intake related to acute illness as evidenced by NPO status.  Remains appropriate  GOAL:   Patient will meet greater than or equal to 90% of their needs  Goal met; continuing   MONITOR:   TF tolerance, Labs, Weight trends, Vent status  REASON FOR ASSESSMENT:   Consult Enteral/tube feeding initiation and management  ASSESSMENT:   77yo female admitted with acute on chronic respiratory failure requiring intubation on 10/22 with CAP, pulmonary sarcoidosis and OSA; pt also with AKI, in shock, likely cardiogenic. Additional  PMH includes CHF, pulmonary HTN, HLD, GERD, invasive ductal carcinoma  Patient is currently intubated on ventilator support, Vital High Protein infusing 582mhr via OG  MV: 6.6 L/min Temp (24hrs), Avg:96.7 F (35.9 C), Min:96.3 F (35.7 C), Max:97.1 F (36.2 C)  Labs: CBGs 293-355, BUN 82, Creatinine 2.86  Meds:precedex, fentanyl   NUTRITION - FOCUSED PHYSICAL EXAM:    Most Recent Value  Orbital Region  Mild depletion  Upper Arm Region  Unable to assess [d/t pt status at visit]  Thoracic and Lumbar Region  Unable to assess  Buccal Region  Unable to assess  Temple Region  Mild depletion  Clavicle Bone Region  No depletion  Clavicle and Acromion Bone Region  Unable to assess  Scapular Bone Region  Unable to assess  Dorsal Hand  Unable to assess  Patellar Region  Mild depletion  Anterior Thigh Region  Mild depletion  Posterior Calf Region  Mild depletion  Edema (RD Assessment)  Mild [BLE,  nonpitting]  Hair  Reviewed  Eyes  Reviewed  Mouth  Unable to assess  Skin  Reviewed  Nails  Unable to assess       Diet Order:   Diet Order            Diet NPO time specified   Diet effective now              EDUCATION NEEDS:   Not appropriate for education at this time  Skin:  Skin Assessment: Reviewed RN Assessment  Last BM:  10/20  Height:   Ht Readings from Last 1 Encounters:  03/26/18 5' 2"  (1.575 m)    Weight:   Wt Readings from Last 1 Encounters:  03/29/18 72.7 kg    Ideal Body Weight:     BMI:  Body mass index is 29.31 kg/m.  Estimated Nutritional Needs:   Kcal:  1281 kcals   Protein:  100-115 g   Fluid:  >/= 1.5 L    SuLajuan LinesRD, LDN  After Hours/Weekend Pager: [phone]

## 2018-03-29 NOTE — Progress Notes (Signed)
[place] for Heparin Indication: atrial fibrillation  Allergies  Allergen Reactions  . Hydroxychloroquine Swelling    Patient reported increased swelling of her thyroid gland area. But patient has thyromegaly. Unclear if it is really from Plaquenil or not.  . Hydrocodone     Itching  . Imdur [Isosorbide Dinitrate]     Itching  . Percocet [Oxycodone-Acetaminophen]     Itching    Patient Measurements: Height: 5\' 2"  (157.5 cm) Weight: 160 lb 4.4 oz (72.7 kg) IBW/kg (Calculated) : 50.1 Heparin Dosing Weight: 65.1 kg  Vital Signs: Temp: 97.8 F (36.6 C) (10/25 1315) Temp Source: Oral (10/25 1315) BP: 132/53 (10/25 1400) Pulse Rate: 69 (10/25 1400)  Labs: Recent Labs    03/26/18 1745  03/27/18 0447  03/28/18 0453 03/28/18 0454 03/28/18 1530  03/28/18 2217 03/29/18 0431 03/29/18 0544 03/29/18 0741  HGB  --    < > 8.1*  --   --  7.5*  --   --   --   --  6.5*  --   HCT  --   --  24.7*  --   --  22.1*  --   --   --   --  19.7*  --   PLT  --   --  156  --   --  121*  --   --   --   --  94*  --   APTT  --   --   --    < > >200*  --  >200*   < > >200* 125*  --  88*  LABPROT  --   --   --   --   --   --  21.4*  --   --   --   --   --   INR  --   --   --   --   --   --  1.89  --   --   --   --   --   HEPARINUNFRC  --   --   --    < > >2.20*  --  >2.20*  --   --  >2.20*  --   --   CREATININE  --   --  2.86*  --   --  2.99*  --   --   --  2.86*  --   --   TROPONINI 0.07*  --   --   --   --   --   --   --   --   --   --   --    < > = values in this interval not displayed.    Estimated Creatinine Clearance: 15.6 mL/min (A) (by C-G formula based on SCr of 2.86 mg/dL (H)).   Medical History: Past Medical History:  Diagnosis Date  . Asthma   . CHF (congestive heart failure) ([place])   . GERD (gastroesophageal reflux disease)   . HLD (hyperlipidemia)   . Hypertension    Assessment: Patient is a 49 yoF with a history of atrial  fibrillation on Eliquis PTA, last dose around 2200 on 03/26/18. Due to patient's elevated Scr>2.5, Eliquis was discontinued and Pharmacy was consulted to start heparin 03/27/18.   Patient's anticoagulation therapy has been complicated by ongoing hepatic dysfunction. Heparin was held overnight due to numerous supratherapeutic heparin level and aPTT despite holding multiple times and rate decreases. Early this morning, aPTT was 88.  Discussed with Dr. Criss Rosales  the appropriateness of restarting heparin given patient was now anemic at HgB 6.5 requiring transfusion and report of minimal blood in ETT per RN. Dr. Criss Rosales recommended to hold re-initiation until active bleed could be ruled out.    Of note, platelets are 94. Awaiting afternoon CBC for HgB s/p 1u pRBC.     Goal of Therapy:  Heparin level 0.3-0.7 units/ml aPTT 66-102 seconds Monitor platelets by anticoagulation protocol: Yes   Plan:  Hold heparin for now Follow-up plan to resume  Willia Craze, Pharmacy Student

## 2018-03-29 NOTE — Progress Notes (Signed)
NAME:  Heather Mckay, MRN:  [id number], DOB:  [birth year], LOS: 5 ADMISSION DATE:  03/22/2018, CONSULTATION DATE:  10/21 REFERRING MD:  Sloan Leiter, CHIEF COMPLAINT:  Dyspnea   Brief History   77 y/o female with multiple medical problems including CHF, pulmonary sarcoidosis was admitted on 10/20 to the hospitalist service with dyspnea, myalgias, cough noted to have a positive RVP for metapneumovirus.    Past Medical History  Sarcoidosis, Pulmonary hypertension, CHF, HTN, HLD, GERD, Asthma, infasive ductal carcinoma, MGUS, OSA on CPAP and on home [facility]   10/20 admit 10/21 PCCM consulted 10/22 early AM intubated, in shock 10/25 Hgb 6.5, 1u pRBC ordered  Consults:  10/21 PCCM   Procedures (surgical and bedside):  10/22 ETT  Significant Diagnostic Tests:  12/2016 TTE > RVSP 69 mmHb, LVEF 60-65%, mod LVH, normal left atrium  Micro Data:  RVP 10/20 >> positive for metapneumovirus  BCx2 10/20 >> ngx5days Sputum 10/20 >> MRSA pos  Antimicrobials:  Ceftriaxone 10/20 >>10/24 Azithro 10/20 >> 10/24 Zosyn/vanc x1 dose 10/21  Subjective:  Hypothermic to 96.3 Still hypoxemic Urine output improved 10/24 Now anemic Hgb 6.5 Urine output improving, sCr stable at 2.9-2.9 LFT's elevated but down am 10/25  Objective   Blood pressure (!) 126/51, pulse 61, temperature (!) 97.5 F (36.4 C), temperature source Oral, resp. rate 20, height 5\' 2"  (1.575 m), weight 72.7 kg, SpO2 93 %. CVP:  [10 mmHg] 10 mmHg  Vent Mode: PRVC FiO2 (%):  [50 %] 50 % Set Rate:  [15 bmp] 15 bmp Vt Set:  [400 mL] 400 mL PEEP:  [12 cmH20] 12 cmH20 Plateau Pressure:  [18 cmH20-26 cmH20] 26 cmH20   Intake/Output Summary (Last 24 hours) at 03/29/2018 0817 Last data filed at 03/29/2018 0755 Gross per 24 hour  Intake 2929.67 ml  Output 1600 ml  Net 1329.67 ml   Filed Weights   03/27/18 0331 03/28/18 0459 03/29/18 0500  Weight: 70.9 kg 72.7 kg 72.7 kg    Examination:  General:  In bed  on vent, RASS -3 HENT: NCAT ETT in place PULM: course lung sounds but good air movement, vent supported breathing CV: rate wnl, s1/2 GI: BS+, soft, nontender MSK: normal bulk and tone Neuro: sedated on vent  Resolved Hospital Problem list     Assessment & Plan:  Acute on chronic respiratory failure with hypoxemia > continue full vent support > VAP prevention > titrate PEEP, FiO2 to target PaO2 55-65 >reduced sedation because patient was RASS -4 overnight  CAP due to metapneumovirus > s/p vanc/zosyn/ceftriaxone/azithro   OSA  > will need CPAP post extubation  Pulmonary Sarcoidosis  > continue bronchodilators scheduled  AKI: CVL to measure CVP for volume assessment, urine output improving but sCr not > LR bolus now > Monitor BMET and UOP > Replace electrolytes as needed  Transaminitis: trending down on 10/25 AST 787, ALT 494.  Possibly congestive hepatopathy vs shock liver from prior hypotension -question as to this effecting elevated aPTT  Anemia: Hgb to 6.5 am 10/25 -Whitney, RN obtained consent over phone for 1u pRBC transfusion -patient had been on iron 325 daily outpatient, will weigh restarting   Elevated aPTT: >125 held heparin overnight, also with Hgb 6.5 but no obvious source of bleeding -held heparin temporarily, will trend -will follow liver function  Afib baseline: currently rate controlled > d/c'd amiodarone > d/c'd eliquis  Shock (resolved): normotensive 10/25 off of pressers > presumably cardiogenic given history of pulmonary hypertension > will not administer  30cc/kg because I fear this could be harmful   Disposition / Summary of Today's Plan 03/29/18   Read above    Diet: cont tube feeding  Pain: Anxiety/Delirium protocol (if indicated): RASS goal -2 VAP protocol (if indicated): yes DVT prophylaxis: heparin being held in light of high aPPT and low Hgb GI prophylaxis: famotidine Hyperglycemia protocol: cont SSI q4h, maintain lantus  7 Mobility: bed rest Code Status: full Family Communication: attending updated her sons Mitzi Hansen and Jeneen Rinks 10/22 bedside, they desire full code.  Attending expressed concern for her overall grave prognosis and explained they think her likelihood of surviving 30 days is < 20%  Labs   CBC: Recent Labs  Lab 03/21/2018 1150 03/25/18 0447 03/26/18 0541 03/27/18 0447 03/28/18 0454 03/29/18 0544  WBC 8.0 8.7 11.3* 12.1* 14.5* 9.1  NEUTROABS 6.8 7.3  --  11.2*  --   --   HGB 8.6* 9.1* 8.4* 8.1* 7.5* 6.5*  HCT 27.9* 28.1* 26.3* 24.7* 22.1* 19.7*  MCV 83.8 80.5 79.9* 77.2* 75.2* 77.6*  PLT 96* 96* 116* 156 121* 94*    Basic Metabolic Panel: Recent Labs  Lab 03/26/18 0135 03/26/18 0957 03/26/18 1745 03/27/18 0447 03/27/18 1512 03/28/18 0454 03/29/18 0431  NA 136 136  --  136  --  139 144  K 5.2* 4.6  --  4.7  --  3.9 3.5  CL 108 104  --  104  --  106 107  CO2 12* 16*  --  20*  --  20* 23  GLUCOSE 149* 382*  --  252*  --  125* 190*  BUN 40* 46*  --  64*  --  77* 82*  CREATININE 2.41* 2.41*  --  2.86*  --  2.99* 2.86*  CALCIUM 8.4* 7.2*  --  7.3*  --  7.2* 6.9*  MG  --  1.7 1.7 2.0 2.0  --   --   PHOS  --  5.1* 3.9 3.6 3.3  --   --    GFR: Estimated Creatinine Clearance: 15.6 mL/min (A) (by C-G formula based on SCr of 2.86 mg/dL (H)). Recent Labs  Lab 03/22/2018 1150  03/08/2018 1351 03/25/18 0447 03/26/18 0135 03/26/18 0541 03/27/18 0447 03/27/18 1542 03/28/18 0454 03/29/18 0544  PROCALCITON 0.73  --   --  1.26  --  3.60  --   --   --   --   WBC 8.0  --   --  8.7  --  11.3* 12.1*  --  14.5* 9.1  LATICACIDVEN  --    < > 1.41  --  6.5* 7.1*  --  3.3*  --   --    < > = values in this interval not displayed.    Liver Function Tests: Recent Labs  Lab 03/25/18 0447 03/26/18 0541 03/26/18 0957 03/27/18 0447 03/28/18 0454 03/29/18 0431  AST 402* 662*  --  1,456* 1,257* 787*  ALT 262* 319*  --  634* 631* 494*  ALKPHOS 61 101  --  100 112 105  BILITOT 1.1 1.5*  --  1.0  0.9 0.6  PROT 7.3 6.3*  --  6.1* 5.9* 6.0*  ALBUMIN 3.0* 2.5* 2.3* 2.3* 2.4* 2.3*   No results for input(s): LIPASE, AMYLASE in the last 168 hours. Recent Labs  Lab 03/26/18 0541  AMMONIA 73*    ABG    Component Value Date/Time   PHART 7.283 (L) 03/29/2018 0405   PCO2ART 46.5 03/29/2018 0405   PO2ART 61.0 (L) 03/29/2018 0405  HCO3 22.1 03/29/2018 0405   TCO2 24 03/29/2018 0405   ACIDBASEDEF 4.0 (H) 03/29/2018 0405   O2SAT 89.0 03/29/2018 0405     Coagulation Profile: Recent Labs  Lab 03/26/18 0541 03/28/18 1530  INR 2.17 1.89    Cardiac Enzymes: Recent Labs  Lab 03/26/18 0541 03/26/18 0957 03/26/18 1745  TROPONINI 0.06* 0.06* 0.07*    HbA1C: No results found for: HGBA1C  CBG: Recent Labs  Lab 03/28/18 1618 03/28/18 2004 03/28/18 2345 03/29/18 0354 03/29/18 0726  GLUCAP 219* 153* 166* 160* 190*     Critical care time:     Sherene Sires, [place] 03/29/2018 8:17 AM

## 2018-03-29 NOTE — Progress Notes (Signed)
CRITICAL VALUE ALERT  Critical Value:  Hemoglobin 6.5  Date & Time Notied:  0628  10/25  Provider Notified: Warren Lacy  Orders Received/Actions taken:

## 2018-03-29 NOTE — Progress Notes (Signed)
[place] for Heparin Indication: atrial fibrillation  Allergies  Allergen Reactions  . Hydroxychloroquine Swelling    Patient reported increased swelling of her thyroid gland area. But patient has thyromegaly. Unclear if it is really from Plaquenil or not.  . Hydrocodone     Itching  . Imdur [Isosorbide Dinitrate]     Itching  . Percocet [Oxycodone-Acetaminophen]     Itching    Patient Measurements: Height: 5\' 2"  (157.5 cm) Weight: 160 lb 4.4 oz (72.7 kg) IBW/kg (Calculated) : 50.1 Heparin Dosing Weight: 65.1 kg  Vital Signs: Temp: 97.5 F (36.4 C) (10/25 0000) Temp Source: Axillary (10/25 0000) BP: 101/47 (10/25 0004) Pulse Rate: 57 (10/25 0004)  Labs: Recent Labs    03/26/18 0541 03/26/18 0957 03/26/18 1745 03/27/18 0447  03/27/18 2103 03/28/18 0453 03/28/18 0454 03/28/18 1530 03/28/18 1805 03/28/18 2217  HGB 8.4*  --   --  8.1*  --   --   --  7.5*  --   --   --   HCT 26.3*  --   --  24.7*  --   --   --  22.1*  --   --   --   PLT 116*  --   --  156  --   --   --  121*  --   --   --   APTT  --   --   --   --    < > >200* >200*  --  >200* >200* >200*  LABPROT 23.9*  --   --   --   --   --   --   --  21.4*  --   --   INR 2.17  --   --   --   --   --   --   --  1.89  --   --   HEPARINUNFRC  --   --   --   --    < > >2.20* >2.20*  --  >2.20*  --   --   CREATININE  --  2.41*  --  2.86*  --   --   --  2.99*  --   --   --   TROPONINI 0.06* 0.06* 0.07*  --   --   --   --   --   --   --   --    < > = values in this interval not displayed.    Estimated Creatinine Clearance: 14.9 mL/min (A) (by C-G formula based on SCr of 2.99 mg/dL (H)).   Medical History: Past Medical History:  Diagnosis Date  . Asthma   . CHF (congestive heart failure) ([place])   . GERD (gastroesophageal reflux disease)   . HLD (hyperlipidemia)   . Hypertension    Assessment: Patient is a 53 yoF with a history of atrial fibrillation on Eliquis PTA, last  dose around 2200 yesterday evening 03/26/18. Due to patient's elevated Scr at 2.86 mg/dL, Eliquis was discontinued and Pharmacy has been consulted to start heparin in the interim. Of note, platelets are 156, HgB is low at 8.1, and LFTs are >5x ULN. No overt signs of bleed per RN.   Noted documentation of brachial vein segment clot that is not an acute DVT.  10/24 AM update: heparin level is >2.2 (likely from apixaban use). However, aPTT is also markedly elevated despite holding multiple times and multiple rate decreases.    Goal of Therapy:  Heparin level  0.3-0.7 units/ml aPTT 66-102 seconds Monitor platelets by anticoagulation protocol: Yes   Plan:  Hold heparin for now Re-check aPTT/HL with AM labs Re-start heparin as appropriate based on lab values  Narda Bonds, PharmD, [place] Pharmacist Phone: [phone]

## 2018-03-29 NOTE — Progress Notes (Signed)
[facility] for Heparin Indication: atrial fibrillation  Allergies  Allergen Reactions  . Hydroxychloroquine Swelling    Patient reported increased swelling of her thyroid gland area. But patient has thyromegaly. Unclear if it is really from Plaquenil or not.  . Hydrocodone     Itching  . Imdur [Isosorbide Dinitrate]     Itching  . Percocet [Oxycodone-Acetaminophen]     Itching    Patient Measurements: Height: 5\' 2"  (157.5 cm) Weight: 160 lb 4.4 oz (72.7 kg) IBW/kg (Calculated) : 50.1 Heparin Dosing Weight: 65.1 kg  Vital Signs: Temp: 97.6 F (36.4 C) (10/25 0500) Temp Source: Oral (10/25 0500) BP: 113/50 (10/25 0600) Pulse Rate: 62 (10/25 0600)  Labs: Recent Labs    03/26/18 0957 03/26/18 1745  03/27/18 0447  03/28/18 0453 03/28/18 0454 03/28/18 1530 03/28/18 1805 03/28/18 2217 03/29/18 0431 03/29/18 0544  HGB  --   --    < > 8.1*  --   --  7.5*  --   --   --   --  6.5*  HCT  --   --   --  24.7*  --   --  22.1*  --   --   --   --  19.7*  PLT  --   --   --  156  --   --  121*  --   --   --   --  94*  APTT  --   --   --   --    < > >200*  --  >200* >200* >200* 125*  --   LABPROT  --   --   --   --   --   --   --  21.4*  --   --   --   --   INR  --   --   --   --   --   --   --  1.89  --   --   --   --   HEPARINUNFRC  --   --   --   --    < > >2.20*  --  >2.20*  --   --  >2.20*  --   CREATININE 2.41*  --   --  2.86*  --   --  2.99*  --   --   --  2.86*  --   TROPONINI 0.06* 0.07*  --   --   --   --   --   --   --   --   --   --    < > = values in this interval not displayed.    Estimated Creatinine Clearance: 15.6 mL/min (A) (by C-G formula based on SCr of 2.86 mg/dL (H)).   Medical History: Past Medical History:  Diagnosis Date  . Asthma   . CHF (congestive heart failure) ([place])   . GERD (gastroesophageal reflux disease)   . HLD (hyperlipidemia)   . Hypertension    Assessment: Patient is a 17 yoF with a history of  atrial fibrillation on Eliquis PTA, last dose around 2200 yesterday evening 03/26/18. Due to patient's elevated Scr at 2.86 mg/dL, Eliquis was discontinued and Pharmacy has been consulted to start heparin in the interim. Of note, platelets are 156, HgB is low at 8.1, and LFTs are >5x ULN. No overt signs of bleed per RN.   Noted documentation of brachial vein segment clot that is not an acute DVT.  10/24  AM update: heparin level is >2.2 (likely from apixaban use). After holding the heparin overnight the aPTT is trending down some to 125. Noted Hgb drop from 7.5>>>6.5. No overt bleeding noted. MD is aware, RN awaiting possible orders for transfusion.    Goal of Therapy:  Heparin level 0.3-0.7 units/ml aPTT 66-102 seconds Monitor platelets by anticoagulation protocol: Yes   Plan:  Hold heparin for now Re-check aPTT in 3 hours at 0800 Re-start heparin if aPTT is ~80-100 or less F/U MD plans for Hgb 6.5  Narda Bonds, PharmD, BCPS Clinical Pharmacist Phone: [phone]

## 2018-03-29 NOTE — Progress Notes (Signed)
Attempted to call pt's Son to obtain consent for blood transfusion. No answer. Will reattempt contact. Eartha Inch, RN, witnessed.  Delorise Jackson, RN

## 2018-03-30 ENCOUNTER — Inpatient Hospital Stay (HOSPITAL_COMMUNITY): Payer: Medicare Other

## 2018-03-30 LAB — GLUCOSE, CAPILLARY
GLUCOSE-CAPILLARY: 203 mg/dL — AB (ref 70–99)
GLUCOSE-CAPILLARY: 218 mg/dL — AB (ref 70–99)
GLUCOSE-CAPILLARY: 221 mg/dL — AB (ref 70–99)
Glucose-Capillary: 189 mg/dL — ABNORMAL HIGH (ref 70–99)
Glucose-Capillary: 205 mg/dL — ABNORMAL HIGH (ref 70–99)
Glucose-Capillary: 146 mg/dL — ABNORMAL HIGH (ref 70–99)

## 2018-03-30 LAB — CBC
HCT: 26.7 % — ABNORMAL LOW (ref 36.0–46.0)
HEMATOCRIT: 24.7 % — AB (ref 36.0–46.0)
Hemoglobin: 7.5 g/dL — ABNORMAL LOW (ref 12.0–15.0)
Hemoglobin: 8.3 g/dL — ABNORMAL LOW (ref 12.0–15.0)
MCH: 25 pg — AB (ref 26.0–34.0)
MCH: 26.1 pg (ref 26.0–34.0)
MCHC: 30.4 g/dL (ref 30.0–36.0)
MCHC: 31.1 g/dL (ref 30.0–36.0)
MCV: 82.3 fL (ref 80.0–100.0)
MCV: 84 fL (ref 80.0–100.0)
PLATELETS: 120 10*3/uL — AB (ref 150–400)
Platelets: 96 10*3/uL — ABNORMAL LOW (ref 150–400)
RBC: 3 MIL/uL — ABNORMAL LOW (ref 3.87–5.11)
RBC: 3.18 MIL/uL — AB (ref 3.87–5.11)
RDW: 17.2 % — AB (ref 11.5–15.5)
RDW: 17.6 % — AB (ref 11.5–15.5)
WBC: 10.6 10*3/uL — ABNORMAL HIGH (ref 4.0–10.5)
WBC: 11.2 10*3/uL — ABNORMAL HIGH (ref 4.0–10.5)
nRBC: 0.4 % — ABNORMAL HIGH (ref 0.0–0.2)
nRBC: 0.6 % — ABNORMAL HIGH (ref 0.0–0.2)

## 2018-03-30 LAB — COMPREHENSIVE METABOLIC PANEL
ALT: 330 U/L — ABNORMAL HIGH (ref 0–44)
AST: 430 U/L — ABNORMAL HIGH (ref 15–41)
Albumin: 2.3 g/dL — ABNORMAL LOW (ref 3.5–5.0)
Alkaline Phosphatase: 138 U/L — ABNORMAL HIGH (ref 38–126)
Anion gap: 11 (ref 5–15)
BILIRUBIN TOTAL: 0.6 mg/dL (ref 0.3–1.2)
BUN: 87 mg/dL — ABNORMAL HIGH (ref 8–23)
CHLORIDE: 109 mmol/L (ref 98–111)
CO2: 24 mmol/L (ref 22–32)
CREATININE: 2.56 mg/dL — AB (ref 0.44–1.00)
Calcium: 6.6 mg/dL — ABNORMAL LOW (ref 8.9–10.3)
GFR, EST AFRICAN AMERICAN: 20 mL/min — AB (ref >60)
GFR, EST NON AFRICAN AMERICAN: 17 mL/min — AB (ref >60)
Glucose, Bld: 219 mg/dL — ABNORMAL HIGH (ref 70–99)
Potassium: 3.5 mmol/L (ref 3.5–5.1)
Sodium: 144 mmol/L (ref 135–145)
TOTAL PROTEIN: 5.9 g/dL — AB (ref 6.5–8.1)

## 2018-03-30 LAB — TYPE AND SCREEN
ABO/RH(D): O POS
ANTIBODY SCREEN: NEGATIVE
UNIT DIVISION: 0

## 2018-03-30 LAB — BPAM RBC
Blood Product Expiration Date: 201911212359
ISSUE DATE / TIME: 201910251002
UNIT TYPE AND RH: 5100

## 2018-03-30 LAB — APTT: aPTT: 39 seconds — ABNORMAL HIGH (ref 24–36)

## 2018-03-30 MED ORDER — HYDRALAZINE HCL 20 MG/ML IJ SOLN
INTRAMUSCULAR | Status: AC
  Administered 2018-03-30: 20 mg
  Filled 2018-03-30: qty 1

## 2018-03-30 MED ORDER — FUROSEMIDE 10 MG/ML IJ SOLN
80.0000 mg | Freq: Two times a day (BID) | INTRAMUSCULAR | Status: AC
  Administered 2018-03-30 (×2): 80 mg via INTRAVENOUS
  Filled 2018-03-30 (×2): qty 8

## 2018-03-30 MED ORDER — FENTANYL CITRATE (PF) 2500 MCG/50ML IJ SOLN
0.0000 ug/h | Status: DC
  Administered 2018-03-30: 30 ug/h via INTRAVENOUS
  Administered 2018-03-31 (×2): 300 ug/h via INTRAVENOUS
  Administered 2018-04-01: 250 ug/h via INTRAVENOUS
  Administered 2018-04-02: 300 ug/h via INTRAVENOUS
  Administered 2018-04-03 – 2018-04-04 (×3): 400 ug/h via INTRAVENOUS
  Filled 2018-03-30 (×6): qty 100
  Filled 2018-03-30: qty 50
  Filled 2018-03-30: qty 100

## 2018-03-30 MED ORDER — INSULIN GLARGINE 100 UNIT/ML ~~LOC~~ SOLN
8.0000 [IU] | Freq: Every day | SUBCUTANEOUS | Status: DC
  Administered 2018-03-30 – 2018-03-31 (×2): 8 [IU] via SUBCUTANEOUS
  Filled 2018-03-30 (×2): qty 0.08

## 2018-03-30 MED ORDER — HYDRALAZINE HCL 20 MG/ML IJ SOLN
10.0000 mg | INTRAMUSCULAR | Status: DC | PRN
  Administered 2018-04-02: 10 mg via INTRAVENOUS
  Administered 2018-04-02: 20 mg via INTRAVENOUS
  Administered 2018-04-05 – 2018-04-15 (×4): 10 mg via INTRAVENOUS
  Filled 2018-03-30 (×7): qty 1

## 2018-03-30 MED ORDER — HYDROCORTISONE NA SUCCINATE PF 100 MG IJ SOLR
50.0000 mg | Freq: Two times a day (BID) | INTRAMUSCULAR | Status: DC
  Administered 2018-03-31 – 2018-04-01 (×3): 50 mg via INTRAVENOUS
  Filled 2018-03-30 (×4): qty 2

## 2018-03-30 NOTE — Progress Notes (Signed)
Attending:    Subjective: Hgb stable No active bleeding Remains on high dose ventilator support  Objective: Vitals:   03/30/18 0700 03/30/18 0730 03/30/18 0830 03/30/18 0900  BP: (!) 129/50   (!) 132/48  Pulse: 66     Resp: 15   15  Temp:  97.7 F (36.5 C) 97.7 F (36.5 C)   TempSrc:  Oral Oral   SpO2: 93%     Weight:      Height:       Vent Mode: PRVC FiO2 (%):  [50 %-60 %] 60 % Set Rate:  [15 bmp] 15 bmp Vt Set:  [400 mL] 400 mL PEEP:  [12 cmH20] 12 cmH20 Plateau Pressure:  [20 cmH20-36 cmH20] 33 cmH20  Intake/Output Summary (Last 24 hours) at 03/30/2018 1005 Last data filed at 03/30/2018 0800 Gross per 24 hour  Intake 2976.03 ml  Output 2000 ml  Net 976.03 ml    General:  In bed on vent HENT: NCAT ETT in place PULM: Wheezing bialterally B, vent supported breathing CV: RRR, no mgr GI: BS+, soft, nontender MSK: normal bulk and tone Neuro: sedated on vent     CBC    Component Value Date/Time   WBC 10.6 (H) 03/30/2018 0442   RBC 3.00 (L) 03/30/2018 0442   HGB 7.5 (L) 03/30/2018 0442   HCT 24.7 (L) 03/30/2018 0442   PLT 96 (L) 03/30/2018 0442   MCV 82.3 03/30/2018 0442   MCH 25.0 (L) 03/30/2018 0442   MCHC 30.4 03/30/2018 0442   RDW 17.2 (H) 03/30/2018 0442   LYMPHSABS 0.6 (L) 03/27/2018 0447   MONOABS 0.1 03/27/2018 0447   EOSABS 0.0 03/27/2018 0447   BASOSABS 0.0 03/27/2018 0447    BMET    Component Value Date/Time   NA 144 03/30/2018 0442   K 3.5 03/30/2018 0442   CL 109 03/30/2018 0442   CO2 24 03/30/2018 0442   GLUCOSE 219 (H) 03/30/2018 0442   BUN 87 (H) 03/30/2018 0442   CREATININE 2.56 (H) 03/30/2018 0442   CALCIUM 6.6 (L) 03/30/2018 0442   GFRNONAA 17 (L) 03/30/2018 0442   GFRAA 20 (L) 03/30/2018 0442    CXR images reviewed, minimal change  Impression/Plan: Acute respiratory failure with hypoxemia > continue full vent support, no pressure support weaning today due to high PEEP/FiO2 needs, diurese x2 doses, minimize IV  intake Shock resolved: Wean down hydrocortisone Pulmonary hypertension, volume overload > Continue diuresis, concentrate meds Hyperglycemia: Increase lantus Anemia, no bleeding, chronic normocytic> Repeat CBC, hold heparin Nutrition> continue tube feedings  My cc time 31 minutes  Roselie Awkward, MD [facility] Pager: [phone] Cell: [phone] After 3pm or if no response, call [phone]

## 2018-03-31 ENCOUNTER — Inpatient Hospital Stay (HOSPITAL_COMMUNITY): Payer: Medicare Other

## 2018-03-31 LAB — GLUCOSE, CAPILLARY
GLUCOSE-CAPILLARY: 171 mg/dL — AB (ref 70–99)
GLUCOSE-CAPILLARY: 144 mg/dL — AB (ref 70–99)
GLUCOSE-CAPILLARY: 143 mg/dL — AB (ref 70–99)
Glucose-Capillary: 155 mg/dL — ABNORMAL HIGH (ref 70–99)
Glucose-Capillary: 120 mg/dL — ABNORMAL HIGH (ref 70–99)
Glucose-Capillary: 147 mg/dL — ABNORMAL HIGH (ref 70–99)
Glucose-Capillary: 176 mg/dL — ABNORMAL HIGH (ref 70–99)

## 2018-03-31 LAB — CBC
HEMATOCRIT: 27.4 % — AB (ref 36.0–46.0)
Hemoglobin: 8.2 g/dL — ABNORMAL LOW (ref 12.0–15.0)
MCH: 25.2 pg — ABNORMAL LOW (ref 26.0–34.0)
MCHC: 29.9 g/dL — AB (ref 30.0–36.0)
MCV: 84 fL (ref 80.0–100.0)
NRBC: 0.7 % — AB (ref 0.0–0.2)
PLATELETS: 106 10*3/uL — AB (ref 150–400)
RBC: 3.26 MIL/uL — ABNORMAL LOW (ref 3.87–5.11)
RDW: 17.9 % — AB (ref 11.5–15.5)
WBC: 12 10*3/uL — AB (ref 4.0–10.5)

## 2018-03-31 LAB — COMPREHENSIVE METABOLIC PANEL
ALT: 252 U/L — ABNORMAL HIGH (ref 0–44)
AST: 265 U/L — AB (ref 15–41)
Albumin: 2 g/dL — ABNORMAL LOW (ref 3.5–5.0)
Alkaline Phosphatase: 165 U/L — ABNORMAL HIGH (ref 38–126)
Anion gap: 9 (ref 5–15)
BILIRUBIN TOTAL: 0.7 mg/dL (ref 0.3–1.2)
BUN: 104 mg/dL — AB (ref 8–23)
CO2: 25 mmol/L (ref 22–32)
CREATININE: 2.62 mg/dL — AB (ref 0.44–1.00)
Calcium: 7.2 mg/dL — ABNORMAL LOW (ref 8.9–10.3)
Chloride: 111 mmol/L (ref 98–111)
GFR calc Af Amer: 19 mL/min — ABNORMAL LOW (ref >60)
GFR, EST NON AFRICAN AMERICAN: 17 mL/min — AB (ref >60)
Glucose, Bld: 181 mg/dL — ABNORMAL HIGH (ref 70–99)
POTASSIUM: 4.1 mmol/L (ref 3.5–5.1)
Sodium: 145 mmol/L (ref 135–145)
TOTAL PROTEIN: 5.4 g/dL — AB (ref 6.5–8.1)

## 2018-03-31 LAB — APTT: aPTT: 36 seconds (ref 24–36)

## 2018-03-31 MED ORDER — FUROSEMIDE 10 MG/ML IJ SOLN
80.0000 mg | Freq: Once | INTRAMUSCULAR | Status: AC
  Administered 2018-03-31: 80 mg via INTRAVENOUS
  Filled 2018-03-31: qty 8

## 2018-03-31 MED ORDER — FUROSEMIDE 10 MG/ML IJ SOLN
INTRAMUSCULAR | Status: AC
  Filled 2018-03-31: qty 4

## 2018-03-31 MED ORDER — INSULIN GLARGINE 100 UNIT/ML ~~LOC~~ SOLN
10.0000 [IU] | Freq: Every day | SUBCUTANEOUS | Status: DC
  Administered 2018-04-01 – 2018-04-02 (×2): 10 [IU] via SUBCUTANEOUS
  Filled 2018-03-31 (×2): qty 0.1

## 2018-03-31 MED ORDER — FUROSEMIDE 10 MG/ML IJ SOLN
80.0000 mg | Freq: Two times a day (BID) | INTRAMUSCULAR | Status: AC
  Administered 2018-03-31 – 2018-04-01 (×2): 80 mg via INTRAVENOUS
  Filled 2018-03-31 (×2): qty 8

## 2018-03-31 NOTE — Progress Notes (Signed)
NAME:  Heather Mckay, MRN:  [id number], DOB:  16-Dec-1940, LOS: 7 ADMISSION DATE:  03/20/2018, CONSULTATION DATE:  10/21 REFERRING MD:  Sloan Leiter, CHIEF COMPLAINT:  Dyspnea   Brief History   77 y/o female with multiple medical problems including CHF, pulmonary sarcoidosis was admitted on 10/20 to the hospitalist service with dyspnea, myalgias, cough noted to have a positive RVP for metapneumovirus.    Past Medical History  Sarcoidosis, Pulmonary hypertension, CHF, HTN, HLD, GERD, Asthma, infasive ductal carcinoma, MGUS, OSA on CPAP and on home [facility]   10/20 admit 10/21 PCCM consulted 10/22 early AM intubated, in shock 10/25 Hgb 6.5, 1u pRBC ordered  Consults:  10/21 PCCM   Procedures (surgical and bedside):  10/22 ETT  Significant Diagnostic Tests:  12/2016 TTE > RVSP 69 mmHb, LVEF 60-65%, mod LVH, normal left atrium  Micro Data:  RVP 10/20 >> positive for metapneumovirus  BCx2 10/20 >> ngx5days Sputum 10/20 >> MRSA pos  Antimicrobials:  Ceftriaxone 10/20 >>10/24 Azithro 10/20 >> 10/24 Zosyn/vanc x1 dose 10/21  Subjective:  Max temp overnight 10/26 to 100.2 Still hypoxemic to 87 overnight 10/26 Urine output improved 10/26, net positive 8.2L  anemic stable Hgb 8.2 sCr pending LFT's elevated but down am 10/25  Objective   Blood pressure 122/61, pulse (!) 113, temperature 100.2 F (37.9 C), temperature source Oral, resp. rate 15, height 5\' 2"  (1.575 m), weight 74.3 kg, SpO2 100 %.    Vent Mode: PRVC FiO2 (%):  [60 %-80 %] 80 % Set Rate:  [15 bmp] 15 bmp Vt Set:  [400 mL] 400 mL PEEP:  [12 cmH20-14 cmH20] 14 cmH20 Plateau Pressure:  [26 cmH20-34 cmH20] 34 cmH20   Intake/Output Summary (Last 24 hours) at 03/31/2018 1105 Last data filed at 03/31/2018 1000 Gross per 24 hour  Intake 2291.22 ml  Output 1400 ml  Net 891.22 ml   Filed Weights   03/29/18 0500 03/30/18 0439 03/31/18 0500  Weight: 72.7 kg 74 kg 74.3 kg    Examination:    General: sedated on vent, RASS -4 HENT: NCAT ETT in place PULM: course lung sounds but good air movement, vent supported breathing CV: afib w/o RVR, +1 edema GI: BS+, soft, nontender MSK: normal bulk and tone Neuro: sedated on vent  Resolved Hospital Problem list     Assessment & Plan:  Acute on chronic respiratory failure with hypoxemia, w/ pulm HTN > continue full vent support > VAP prevention > titrate PEEP, FiO2 to target PaO2 55-65 >attempt to diurese  CAP due to metapneumovirus > s/p vanc/zosyn/ceftriaxone/azithro   OSA  > will need CPAP post extubation  Pulmonary Sarcoidosis  > continue bronchodilators scheduled  AKI: CVL to measure CVP for volume assessment, urine output improving but still fluid positive which is problem for pulm HTN > Monitor BMET and UOP > Replace electrolytes as needed >pharm to concentrate meds to decrease fluid intake >lasix 80mg  x2 10/27  Transaminitis: trending down on 10/25 AST 787, ALT 494.  Possibly congestive hepatopathy vs shock liver from prior hypotension -trend CBC  Anemia: Hgb 8.2, stable.  S/p 1u transfusion 10/25 -will discuss restarting heparin -patient had been on iron 325 daily outpatient, will weigh restarting   Elevated aPTT: resolved -holding heparin temporarily, will discuss timing of restart -will follow liver function  Afib baseline: currently rate controlled > d/c'd amiodarone > d/c'd eliquis  Hyperglycemia: 20u novolog since lantus 10/26 sSSI Increase lantus to 10   Disposition / Summary of Today's Plan 03/31/18  Read above    Diet: cont tube feeding  Pain: Anxiety/Delirium protocol (if indicated): RASS goal -2 VAP protocol (if indicated): yes DVT prophylaxis: heparin being held, will determine timeframe to restart GI prophylaxis: famotidine Hyperglycemia protocol: cont SSI q4h, increase lantus to 10 Mobility: bed rest Code Status: full Family Communication: attending updated her sons Mitzi Hansen and  Jeneen Rinks 10/22 bedside, they desire full code.  Attending expressed concern for her overall grave prognosis and explained they think her likelihood of surviving 30 days is < 20%  Labs   CBC: Recent Labs  Lab 03/16/2018 1150 03/25/18 0447  03/27/18 0447  03/29/18 1522 03/29/18 1717 03/30/18 0442 03/30/18 1800 03/31/18 0516  WBC 8.0 8.7   < > 12.1*   < > 13.2* 10.4 10.6* 11.2* 12.0*  NEUTROABS 6.8 7.3  --  11.2*  --   --   --   --   --   --   HGB 8.6* 9.1*   < > 8.1*   < > 8.1* 7.6* 7.5* 8.3* 8.2*  HCT 27.9* 28.1*   < > 24.7*   < > 25.1* 23.6* 24.7* 26.7* 27.4*  MCV 83.8 80.5   < > 77.2*   < > 80.4 79.7* 82.3 84.0 84.0  PLT 96* 96*   < > 156   < > 109* 114* 96* 120* 106*   < > = values in this interval not displayed.    Basic Metabolic Panel: Recent Labs  Lab 03/26/18 0957 03/26/18 1745 03/27/18 0447 03/27/18 1512 03/28/18 0454 03/29/18 0431 03/30/18 0442  NA 136  --  136  --  139 144 144  K 4.6  --  4.7  --  3.9 3.5 3.5  CL 104  --  104  --  106 107 109  CO2 16*  --  20*  --  20* 23 24  GLUCOSE 382*  --  252*  --  125* 190* 219*  BUN 46*  --  64*  --  77* 82* 87*  CREATININE 2.41*  --  2.86*  --  2.99* 2.86* 2.56*  CALCIUM 7.2*  --  7.3*  --  7.2* 6.9* 6.6*  MG 1.7 1.7 2.0 2.0  --   --   --   PHOS 5.1* 3.9 3.6 3.3  --   --   --    GFR: Estimated Creatinine Clearance: 17.6 mL/min (A) (by C-G formula based on SCr of 2.56 mg/dL (H)). Recent Labs  Lab 03/22/2018 1150  04/04/2018 1351 03/25/18 0447 03/26/18 0135 03/26/18 0541  03/27/18 1542  03/29/18 1717 03/30/18 0442 03/30/18 1800 03/31/18 0516  PROCALCITON 0.73  --   --  1.26  --  3.60  --   --   --   --   --   --   --   WBC 8.0  --   --  8.7  --  11.3*   < >  --    < > 10.4 10.6* 11.2* 12.0*  LATICACIDVEN  --    < > 1.41  --  6.5* 7.1*  --  3.3*  --   --   --   --   --    < > = values in this interval not displayed.    Liver Function Tests: Recent Labs  Lab 03/26/18 0541 03/26/18 0957 03/27/18 0447  03/28/18 0454 03/29/18 0431 03/30/18 0442  AST 662*  --  1,456* 1,257* 787* 430*  ALT 319*  --  634* 631* 494* 330*  ALKPHOS 101  --  100 112 105 138*  BILITOT 1.5*  --  1.0 0.9 0.6 0.6  PROT 6.3*  --  6.1* 5.9* 6.0* 5.9*  ALBUMIN 2.5* 2.3* 2.3* 2.4* 2.3* 2.3*   No results for input(s): LIPASE, AMYLASE in the last 168 hours. Recent Labs  Lab 03/26/18 0541  AMMONIA 73*    ABG    Component Value Date/Time   PHART 7.283 (L) 03/29/2018 0405   PCO2ART 46.5 03/29/2018 0405   PO2ART 61.0 (L) 03/29/2018 0405   HCO3 22.1 03/29/2018 0405   TCO2 24 03/29/2018 0405   ACIDBASEDEF 4.0 (H) 03/29/2018 0405   O2SAT 89.0 03/29/2018 0405     Coagulation Profile: Recent Labs  Lab 03/26/18 0541 03/28/18 1530  INR 2.17 1.89    Cardiac Enzymes: Recent Labs  Lab 03/26/18 0541 03/26/18 0957 03/26/18 1745  TROPONINI 0.06* 0.06* 0.07*    HbA1C: No results found for: HGBA1C  CBG: Recent Labs  Lab 03/30/18 1613 03/30/18 1956 03/31/18 0008 03/31/18 0343 03/31/18 0758  GLUCAP 205* 146* 155* 171* 144*     Critical care time:     Sherene Sires, DO  PGY2 [place] 03/31/2018 11:04 AM

## 2018-04-01 ENCOUNTER — Inpatient Hospital Stay (HOSPITAL_COMMUNITY): Payer: Medicare Other

## 2018-04-01 DIAGNOSIS — J9621 Acute and chronic respiratory failure with hypoxia: Secondary | ICD-10-CM

## 2018-04-01 LAB — COMPREHENSIVE METABOLIC PANEL
ALBUMIN: 1.9 g/dL — AB (ref 3.5–5.0)
ALT: 197 U/L — ABNORMAL HIGH (ref 0–44)
ANION GAP: 8 (ref 5–15)
AST: 173 U/L — ABNORMAL HIGH (ref 15–41)
Alkaline Phosphatase: 118 U/L (ref 38–126)
BILIRUBIN TOTAL: 0.6 mg/dL (ref 0.3–1.2)
BUN: 119 mg/dL — ABNORMAL HIGH (ref 8–23)
CALCIUM: 8.1 mg/dL — AB (ref 8.9–10.3)
CO2: 29 mmol/L (ref 22–32)
Chloride: 112 mmol/L — ABNORMAL HIGH (ref 98–111)
Creatinine, Ser: 2.81 mg/dL — ABNORMAL HIGH (ref 0.44–1.00)
GFR calc Af Amer: 18 mL/min — ABNORMAL LOW (ref >60)
GFR calc non Af Amer: 15 mL/min — ABNORMAL LOW (ref >60)
GLUCOSE: 208 mg/dL — AB (ref 70–99)
POTASSIUM: 3.6 mmol/L (ref 3.5–5.1)
SODIUM: 149 mmol/L — AB (ref 135–145)
TOTAL PROTEIN: 5.7 g/dL — AB (ref 6.5–8.1)

## 2018-04-01 LAB — CBC
HCT: 23 % — ABNORMAL LOW (ref 36.0–46.0)
Hemoglobin: 7 g/dL — ABNORMAL LOW (ref 12.0–15.0)
MCH: 25.4 pg — AB (ref 26.0–34.0)
MCHC: 30.4 g/dL (ref 30.0–36.0)
MCV: 83.3 fL (ref 80.0–100.0)
Platelets: 105 10*3/uL — ABNORMAL LOW (ref 150–400)
RBC: 2.76 MIL/uL — ABNORMAL LOW (ref 3.87–5.11)
RDW: 18 % — AB (ref 11.5–15.5)
WBC: 10.4 10*3/uL (ref 4.0–10.5)
nRBC: 0.2 % (ref 0.0–0.2)

## 2018-04-01 LAB — BASIC METABOLIC PANEL
Anion gap: 10 (ref 5–15)
BUN: 132 mg/dL — AB (ref 8–23)
CHLORIDE: 111 mmol/L (ref 98–111)
CO2: 27 mmol/L (ref 22–32)
CREATININE: 2.84 mg/dL — AB (ref 0.44–1.00)
Calcium: 9 mg/dL (ref 8.9–10.3)
GFR calc Af Amer: 17 mL/min — ABNORMAL LOW (ref >60)
GFR calc non Af Amer: 15 mL/min — ABNORMAL LOW (ref >60)
Glucose, Bld: 264 mg/dL — ABNORMAL HIGH (ref 70–99)
POTASSIUM: 4.2 mmol/L (ref 3.5–5.1)
Sodium: 148 mmol/L — ABNORMAL HIGH (ref 135–145)

## 2018-04-01 LAB — GLUCOSE, CAPILLARY
GLUCOSE-CAPILLARY: 190 mg/dL — AB (ref 70–99)
GLUCOSE-CAPILLARY: 204 mg/dL — AB (ref 70–99)
Glucose-Capillary: 186 mg/dL — ABNORMAL HIGH (ref 70–99)
Glucose-Capillary: 219 mg/dL — ABNORMAL HIGH (ref 70–99)
Glucose-Capillary: 249 mg/dL — ABNORMAL HIGH (ref 70–99)
Glucose-Capillary: 231 mg/dL — ABNORMAL HIGH (ref 70–99)

## 2018-04-01 LAB — APTT: aPTT: 41 seconds — ABNORMAL HIGH (ref 24–36)

## 2018-04-01 MED ORDER — POTASSIUM CHLORIDE 20 MEQ PO PACK
40.0000 meq | PACK | Freq: Once | ORAL | Status: AC
  Administered 2018-04-01: 40 meq via ORAL
  Filled 2018-04-01: qty 2

## 2018-04-01 MED ORDER — HEPARIN SODIUM (PORCINE) 5000 UNIT/ML IJ SOLN
5000.0000 [IU] | Freq: Two times a day (BID) | INTRAMUSCULAR | Status: DC
  Administered 2018-04-01 – 2018-04-05 (×9): 5000 [IU] via SUBCUTANEOUS
  Filled 2018-04-01 (×9): qty 1

## 2018-04-01 MED ORDER — QUETIAPINE FUMARATE 50 MG PO TABS
50.0000 mg | ORAL_TABLET | Freq: Every day | ORAL | Status: DC
  Administered 2018-04-01 – 2018-04-05 (×5): 50 mg via ORAL
  Filled 2018-04-01 (×5): qty 1

## 2018-04-01 MED ORDER — FUROSEMIDE 10 MG/ML IJ SOLN
10.0000 mg/h | INTRAVENOUS | Status: DC
  Administered 2018-04-01 – 2018-04-04 (×4): 10 mg/h via INTRAVENOUS
  Filled 2018-04-01 (×4): qty 25
  Filled 2018-04-01: qty 21
  Filled 2018-04-01: qty 25

## 2018-04-01 MED ORDER — VANCOMYCIN HCL IN DEXTROSE 1-5 GM/200ML-% IV SOLN
1000.0000 mg | INTRAVENOUS | Status: DC
  Administered 2018-04-01: 1000 mg via INTRAVENOUS
  Filled 2018-04-01: qty 200

## 2018-04-01 MED ORDER — PIPERACILLIN-TAZOBACTAM IN DEX 2-0.25 GM/50ML IV SOLN
2.2500 g | Freq: Three times a day (TID) | INTRAVENOUS | Status: DC
  Administered 2018-04-01 (×2): 2.25 g via INTRAVENOUS
  Filled 2018-04-01 (×3): qty 50

## 2018-04-01 NOTE — Progress Notes (Signed)
NAME:  Heather Mckay, MRN:  [id number], DOB:  1940-10-10, LOS: 8 ADMISSION DATE:  03/15/2018, CONSULTATION DATE:  10/21 REFERRING MD:  Sloan Leiter, CHIEF COMPLAINT:  Dyspnea   Brief History   77 y/o female with multiple medical problems including CHF, pulmonary sarcoidosis was admitted on 10/20 to the hospitalist service with dyspnea, myalgias, cough noted to have a positive RVP for metapneumovirus.    Past Medical History  Sarcoidosis, Pulmonary hypertension, CHF, HTN, HLD, GERD, Asthma, infasive ductal carcinoma, MGUS, OSA on CPAP and on home [place]   10/20 admit 10/21 PCCM consulted 10/22 early AM intubated, in shock 10/25 Hgb 6.5, 1u pRBC ordered  Consults:  10/21 PCCM   Procedures (surgical and bedside):  10/22 ETT  Significant Diagnostic Tests:  12/2016 TTE > RVSP 69 mmHb, LVEF 60-65%, mod LVH, normal left atrium  Micro Data:  RVP 10/20 >> positive for metapneumovirus  BCx2 10/20 >> ngx5days Sputum 10/20 >> MRSA pos  Antimicrobials:  Ceftriaxone 10/20 >>10/24 Azithro 10/20 >> 10/24 Zosyn/vanc x1 dose 10/21  Subjective:  No overnight events. No desaturation. Suction small amounts.  Objective   Blood pressure (!) 101/47, pulse 66, temperature (!) 97.2 F (36.2 C), temperature source Axillary, resp. rate 17, height 5\' 2"  (1.575 m), weight 74.1 kg, SpO2 100 %. CVP:  [16 mmHg] 16 mmHg  Vent Mode: PRVC FiO2 (%):  [70 %-80 %] 70 % Set Rate:  [15 bmp] 15 bmp Vt Set:  [400 mL] 400 mL PEEP:  [14 cmH20] 14 cmH20 Plateau Pressure:  [27 cmH20-30 cmH20] 28 cmH20   Intake/Output Summary (Last 24 hours) at 04/01/2018 0857 Last data filed at 04/01/2018 0700 Gross per 24 hour  Intake 2324.75 ml  Output 1730 ml  Net 594.75 ml   Filed Weights   03/30/18 0439 03/31/18 0500 04/01/18 0500  Weight: 74 kg 74.3 kg 74.1 kg    Examination:  General: moderately overweight. HENT: ETT and OGt in place. No ulceration PULM: No asynchrony. Tachypneic with cuff  leak. Crackles throughout - squeeking R>L. Plateau 31. CV: afib w/o RVR, +1 edema GI: BS+, soft, nontender MSK: normal bulk and tone Neuro: Sedation RASS -4. Responds to pain only.   Assessment & Plan:  Remains critically ill due to Acute on chronic respiratory failure with hypoxemia, w/ pulm HTN (at baseline and worsened by acute respiratory failure.  > Requires titration of mechanical ventilation to maintain gas exchange with lung protective strategy. > Tolerating progressive reduction in FiO2. Once FiO2 of 0.4 start slow PEEP wean. Monitor for decompensation. > VAP prevention >attempt to diurese  Remains critically ill due to agitation resulting respiratory decompensation.  > Continue to titrate Precedex and fentanyl to respiratory comfort primarily. > As respiratory status improves, will begin to see if she can tolerate lower amounts of sedation  > Will start Seroquel to help with Precedex weaning.  CAP due to metapneumovirus.  Anticipate slow recovery. > antibiotics stopped. > consider course of prednisone for early late ARDS if fails to respond to diuresis.  AKI: CVL to measure CVP for volume assessment, urine output improving but still fluid positive which is problem for pulm HTN.  Still positive fluid balance. > Monitor BMET and UOP > Replace electrolytes as needed >pharm to concentrate meds to decrease fluid intake >furosemide infusion and titrate to achieve negative fluid balance.  Transaminitis: trending down on 10/25 AST 787, ALT 494.  Possibly congestive hepatopathy vs shock liver from prior hypotension -trend CMP  Anemia: Hgb 8.2,  stable.  S/p 1u transfusion 10/25. Likely  Acute ill related and and due to dilution. -will discuss restarting heparin -patient had been on iron 325 daily outpatient, will weigh restarting   Afib baseline: currently rate controlled > d/c'd amiodarone > d/c'd eliquis  Hyperglycemia: now with adequate control. > Continue current Lantus +  moderate SSI.  Pulmonary Sarcoidosis  > continue bronchodilators scheduled   OSA by history. > will need CPAP post extubation  Disposition / Summary of Today's Plan 04/01/18   Read above    Diet: cont tube feeding  Pain: Anxiety/Delirium protocol (if indicated): RASS goal -2 VAP protocol (if indicated): yes DVT prophylaxis: [employer] heparin.  GI prophylaxis: famotidine Hyperglycemia protocol: cont SSI q4h, increase lantus to 10 Mobility: bed rest Code Status: full Family Communication: attending updated her sons Mitzi Hansen and Jeneen Rinks 10/22 bedside, they desire full code.  Attending expressed concern for her overall grave prognosis and explained they think her likelihood of surviving 30 days is < 20%  Ancillary tests   Mild hypernatremia 149 Creatinine stable at 2.81 Improving AST, ALT HB at 7.0 Thrombocytopenia mild at 105.  CXR (personally reviewed) shows bilateral interstitial disease R>>L  Critical care time: 45 min.    Kipp Brood, MD [facility] ICU Physician [place]  Pager: [phone] Mobile: [phone] After hours: [phone].  04/01/2018 8:57 AM

## 2018-04-01 NOTE — Progress Notes (Signed)
Pharmacy Antibiotic Note  Heather Mckay is a 77 y.o. female admitted on 03/23/2018 with cough/fever.  Pharmacy has been consulted for Vancomycin/Zosyn dosing. WBC mildly increased. Noted renal dysfunction requiring dosage adjustment. Tmax 102.4.   Plan: Vancomycin 1000 mg IV q48h Zosyn 2.25g IV q8h Trend WBC, temp, renal function  F/U infectious work-up Drug levels as indicated   Height: 5\' 2"  (157.5 cm) Weight: 163 lb 12.8 oz (74.3 kg) IBW/kg (Calculated) : 50.1  Temp (24hrs), Avg:100.3 F (37.9 C), Min:98.9 F (37.2 C), Max:102.4 F (39.1 C)  Recent Labs  Lab 03/26/18 0135 03/26/18 0541  03/27/18 0447 03/27/18 1542 03/28/18 0454 03/29/18 0431  03/29/18 1522 03/29/18 1717 03/30/18 0442 03/30/18 1800 03/31/18 0516 03/31/18 1212  WBC  --  11.3*  --  12.1*  --  14.5*  --    < > 13.2* 10.4 10.6* 11.2* 12.0*  --   CREATININE 2.41*  --    < > 2.86*  --  2.99* 2.86*  --   --   --  2.56*  --   --  2.62*  LATICACIDVEN 6.5* 7.1*  --   --  3.3*  --   --   --   --   --   --   --   --   --    < > = values in this interval not displayed.    Estimated Creatinine Clearance: 17.2 mL/min (A) (by C-G formula based on SCr of 2.62 mg/dL (H)).    Allergies  Allergen Reactions  . Hydroxychloroquine Swelling    Patient reported increased swelling of her thyroid gland area. But patient has thyromegaly. Unclear if it is really from Plaquenil or not.  . Hydrocodone     Itching  . Imdur [Isosorbide Dinitrate]     Itching  . Percocet [Oxycodone-Acetaminophen]     Itching      Narda Bonds 04/01/2018 1:47 AM

## 2018-04-01 NOTE — Progress Notes (Signed)
[facility] Progress Note Patient Name: Heather Mckay DOB: Sep 08, 1940 MRN: [id number]   Date of Service  04/01/2018  HPI/Events of Note  Fever to 102.4 F - In setting of viral pneumonia and intubation/mechanical ventilation. Concern for 2ndary bacteria pneumonia. AST and ALT both elevated. Creatinine = 2.62. Therefore, can't use Tylenol or Motrin.   eICU Interventions  Will order: 1. Cooling blanket.  2. Blood cultures X 2.  3. Tracheal Aspirate Culture. 4. Vancomycin and Zosyn per pharmacy consult.      Intervention Category Major Interventions: Infection - evaluation and management  Sommer,Steven Eugene 04/01/2018, 12:29 AM

## 2018-04-02 LAB — GLUCOSE, CAPILLARY
GLUCOSE-CAPILLARY: 148 mg/dL — AB (ref 70–99)
Glucose-Capillary: 197 mg/dL — ABNORMAL HIGH (ref 70–99)
Glucose-Capillary: 174 mg/dL — ABNORMAL HIGH (ref 70–99)
Glucose-Capillary: 173 mg/dL — ABNORMAL HIGH (ref 70–99)
Glucose-Capillary: 227 mg/dL — ABNORMAL HIGH (ref 70–99)

## 2018-04-02 LAB — COMPREHENSIVE METABOLIC PANEL
ALBUMIN: 2 g/dL — AB (ref 3.5–5.0)
ALT: 159 U/L — AB (ref 0–44)
AST: 112 U/L — AB (ref 15–41)
Alkaline Phosphatase: 112 U/L (ref 38–126)
Anion gap: 10 (ref 5–15)
BUN: 132 mg/dL — ABNORMAL HIGH (ref 8–23)
CHLORIDE: 111 mmol/L (ref 98–111)
CO2: 28 mmol/L (ref 22–32)
CREATININE: 2.9 mg/dL — AB (ref 0.44–1.00)
Calcium: 9.1 mg/dL (ref 8.9–10.3)
GFR calc Af Amer: 17 mL/min — ABNORMAL LOW (ref >60)
GFR calc non Af Amer: 15 mL/min — ABNORMAL LOW (ref >60)
GLUCOSE: 218 mg/dL — AB (ref 70–99)
Potassium: 3.4 mmol/L — ABNORMAL LOW (ref 3.5–5.1)
SODIUM: 149 mmol/L — AB (ref 135–145)
Total Bilirubin: 0.5 mg/dL (ref 0.3–1.2)
Total Protein: 5.4 g/dL — ABNORMAL LOW (ref 6.5–8.1)

## 2018-04-02 LAB — BLOOD CULTURE ID PANEL (REFLEXED)
Acinetobacter baumannii: NOT DETECTED
CANDIDA ALBICANS: NOT DETECTED
CANDIDA KRUSEI: NOT DETECTED
CANDIDA PARAPSILOSIS: NOT DETECTED
CANDIDA TROPICALIS: NOT DETECTED
Candida glabrata: NOT DETECTED
ENTEROBACTERIACEAE SPECIES: NOT DETECTED
ESCHERICHIA COLI: NOT DETECTED
Enterobacter cloacae complex: NOT DETECTED
Enterococcus species: NOT DETECTED
HAEMOPHILUS INFLUENZAE: NOT DETECTED
KLEBSIELLA OXYTOCA: NOT DETECTED
KLEBSIELLA PNEUMONIAE: NOT DETECTED
Listeria monocytogenes: NOT DETECTED
METHICILLIN RESISTANCE: DETECTED — AB
Neisseria meningitidis: NOT DETECTED
Proteus species: NOT DETECTED
Pseudomonas aeruginosa: NOT DETECTED
STREPTOCOCCUS PNEUMONIAE: NOT DETECTED
STREPTOCOCCUS PYOGENES: NOT DETECTED
Serratia marcescens: NOT DETECTED
Staphylococcus aureus (BCID): NOT DETECTED
Staphylococcus species: DETECTED — AB
Streptococcus agalactiae: NOT DETECTED
Streptococcus species: NOT DETECTED

## 2018-04-02 LAB — CBC
HCT: 21.3 % — ABNORMAL LOW (ref 36.0–46.0)
HCT: 30.4 % — ABNORMAL LOW (ref 36.0–46.0)
HEMOGLOBIN: 6.7 g/dL — AB (ref 12.0–15.0)
Hemoglobin: 9.4 g/dL — ABNORMAL LOW (ref 12.0–15.0)
MCH: 25.6 pg — ABNORMAL LOW (ref 26.0–34.0)
MCH: 25.3 pg — AB (ref 26.0–34.0)
MCHC: 31.5 g/dL (ref 30.0–36.0)
MCHC: 30.9 g/dL (ref 30.0–36.0)
MCV: 81.3 fL (ref 80.0–100.0)
MCV: 81.9 fL (ref 80.0–100.0)
PLATELETS: 130 10*3/uL — AB (ref 150–400)
Platelets: 103 10*3/uL — ABNORMAL LOW (ref 150–400)
RBC: 2.62 MIL/uL — AB (ref 3.87–5.11)
RBC: 3.71 MIL/uL — ABNORMAL LOW (ref 3.87–5.11)
RDW: 17.6 % — ABNORMAL HIGH (ref 11.5–15.5)
RDW: 16.9 % — ABNORMAL HIGH (ref 11.5–15.5)
WBC: 13.6 10*3/uL — AB (ref 4.0–10.5)
WBC: 19.4 10*3/uL — AB (ref 4.0–10.5)
nRBC: 0.1 % (ref 0.0–0.2)
nRBC: 0.2 % (ref 0.0–0.2)

## 2018-04-02 LAB — PREPARE RBC (CROSSMATCH)

## 2018-04-02 LAB — APTT: aPTT: 35 seconds (ref 24–36)

## 2018-04-02 MED ORDER — SODIUM CHLORIDE 0.9% IV SOLUTION
Freq: Once | INTRAVENOUS | Status: AC
  Administered 2018-04-02: 06:00:00 via INTRAVENOUS

## 2018-04-02 MED ORDER — INSULIN GLARGINE 100 UNIT/ML ~~LOC~~ SOLN
12.0000 [IU] | Freq: Every day | SUBCUTANEOUS | Status: DC
  Filled 2018-04-02: qty 0.12

## 2018-04-02 MED ORDER — POTASSIUM CHLORIDE 20 MEQ PO PACK
40.0000 meq | PACK | Freq: Once | ORAL | Status: AC
  Administered 2018-04-02: 40 meq via ORAL
  Filled 2018-04-02: qty 2

## 2018-04-02 NOTE — Progress Notes (Signed)
Family discussion documentation:  RN was called by family asking physician update over the phone, they knew the chart password so I called Jasmine back and discussed her family member's care plan.  She was questioning why patient was not on antiviral but accepted the explanation that we did not have an antiviral for this and were supporting her family's attempt to fight off the illness.  She was also concerned about the poor prognosis that was relayed to family last week and had the impression that medical staff was "giving up" on her family.  I reaffirmed that this would be an uphill recovery battle and that odds of full recovery were indeed low but that at this point the medical team was honoring family wishes for full aggressive care and that would not change without communication to family.  She understood it was our intention to make sure family knew likelihood of outcomes as they made decisions. She accepted that explanation, thanks [place] for the call.  -Dr. Criss Rosales

## 2018-04-02 NOTE — Progress Notes (Signed)
[facility] Progress Note Patient Name: Heather Mckay DOB: 1940-11-11 MRN: [id number]   Date of Service  04/02/2018  HPI/Events of Note  Anemia - Hgb = 6.7.   eICU Interventions  Will transfuse 1 unit PRBC now.      Intervention Category Major Interventions: Other:  Lysle Dingwall 04/02/2018, 4:35 AM

## 2018-04-02 NOTE — Progress Notes (Signed)
Pt spo2 decreased, pt taken off ventilator and bag lavaged (5cc NaCL) instilled down ett and manually ventilated with peep valve set at 12. Spo2 improved to 97%. Moderate amount of pink tinged frothy secretions suctioned out. RN aware

## 2018-04-02 NOTE — Care Management Note (Signed)
Case Management Note Marvetta Gibbons RN,BSN Transitions of Care Unit 35M - RN Case Manager [phone]  Patient Details  Name: Heather Mckay MRN: [id number] Date of Birth: 08-25-1940  Subjective/Objective:   Pt admitted with sepsis, early 10/22 intubated for respiratory failure/shock- + for meta-pneumovirus. Currently on vent 10/29                 Action/Plan: PTA pt lived at home, on home 02. CM will follow for transition of care needs. Prognosis guarded anticipate slow recovery.   Expected Discharge Date:                  Expected Discharge Plan:     In-House Referral:     Discharge planning Services  CM Consult  Post Acute Care Choice:    Choice offered to:     DME Arranged:    DME Agency:     HH Arranged:    HH Agency:     Status of Service:  In process, will continue to follow  If discussed at Long Length of Stay Meetings, dates discussed:    Discharge Disposition:   Additional Comments:  Dawayne Patricia, RN 04/02/2018, 2:17 PM

## 2018-04-02 NOTE — Progress Notes (Signed)
PHARMACY - PHYSICIAN COMMUNICATION CRITICAL VALUE ALERT - BLOOD CULTURE IDENTIFICATION (BCID)  Heather Mckay is an 77 y.o. female who presented to [facility] on 03/26/2018 with a chief complaint of acute respiratory failure/viral PNA  Assessment:  1/2 Blood culture growing MR-CoNS.  Likely contaminant.  Name of physician (or Provider) Contacted: N/A  Current antibiotics: None  Changes to prescribed antibiotics recommended:     None at this time.  Vancomycin consult  d/cd'd yesterday.  Pt received Vancomycin 1 g IV at 0300 10/28 and would not be scheduled for another dose until 10/30 due to renal insufficiency .  Will f/u with primary team in am as to whether Vancomycin should be restarted.  Results for orders placed or performed during the hospital encounter of 03/23/2018  Blood Culture ID Panel (Reflexed) (Collected: 04/01/2018  1:30 AM)  Result Value Ref Range   Enterococcus species NOT DETECTED NOT DETECTED   Listeria monocytogenes NOT DETECTED NOT DETECTED   Staphylococcus species DETECTED (A) NOT DETECTED   Staphylococcus aureus (BCID) NOT DETECTED NOT DETECTED   Methicillin resistance DETECTED (A) NOT DETECTED   Streptococcus species NOT DETECTED NOT DETECTED   Streptococcus agalactiae NOT DETECTED NOT DETECTED   Streptococcus pneumoniae NOT DETECTED NOT DETECTED   Streptococcus pyogenes NOT DETECTED NOT DETECTED   Acinetobacter baumannii NOT DETECTED NOT DETECTED   Enterobacteriaceae species NOT DETECTED NOT DETECTED   Enterobacter cloacae complex NOT DETECTED NOT DETECTED   Escherichia coli NOT DETECTED NOT DETECTED   Klebsiella oxytoca NOT DETECTED NOT DETECTED   Klebsiella pneumoniae NOT DETECTED NOT DETECTED   Proteus species NOT DETECTED NOT DETECTED   Serratia marcescens NOT DETECTED NOT DETECTED   Haemophilus influenzae NOT DETECTED NOT DETECTED   Neisseria meningitidis NOT DETECTED NOT DETECTED   Pseudomonas aeruginosa NOT DETECTED NOT DETECTED   Candida albicans  NOT DETECTED NOT DETECTED   Candida glabrata NOT DETECTED NOT DETECTED   Candida krusei NOT DETECTED NOT DETECTED   Candida parapsilosis NOT DETECTED NOT DETECTED   Candida tropicalis NOT DETECTED NOT DETECTED    Caryl Pina 04/02/2018  2:10 AM

## 2018-04-02 NOTE — Progress Notes (Signed)
NAME:  Heather Mckay, MRN:  [id number], DOB:  1940-07-04, LOS: 9 ADMISSION DATE:  03/09/2018, CONSULTATION DATE:  10/21 REFERRING MD:  Sloan Leiter, CHIEF COMPLAINT:  Dyspnea   Brief History   77 y/o female with multiple medical problems including CHF, pulmonary sarcoidosis was admitted on 10/20 to the hospitalist service with dyspnea, myalgias, cough noted to have a positive RVP for metapneumovirus.    Past Medical History  Sarcoidosis, Pulmonary hypertension, CHF, HTN, HLD, GERD, Asthma, infasive ductal carcinoma, MGUS, OSA on CPAP and on home [facility] [facility]   10/20 admit 10/21 PCCM consulted 10/22 early AM intubated, in shock 10/25 Hgb 6.5, 1u pRBC ordered  Consults:  10/21 PCCM   Procedures (surgical and bedside):  10/22 ETT  Significant Diagnostic Tests:  12/2016 TTE > RVSP 69 mmHb, LVEF 60-65%, mod LVH, normal left atrium  Micro Data:  RVP 10/20 >> positive for metapneumovirus  BCx2 10/20 >> ngx5days, 50% blood cx MR-CoNS, likely contaminant Sputum 10/20 >> MRSA pos  Antimicrobials:  Ceftriaxone 10/20 >>10/24 Azithro 10/20 >> 10/24 Zosyn/vanc x1 dose 10/21  Subjective:  No major events overnight beyond 1u transfusion for anemia (Hgb 6.7)  Objective   Blood pressure (!) 164/70, pulse (!) 107, temperature 98.6 F (37 C), temperature source Core, resp. rate 17, height 5\' 2"  (1.575 m), weight 72.5 kg, SpO2 91 %. CVP:  [14 mmHg-16 mmHg] 15 mmHg  Vent Mode: PRVC FiO2 (%):  [40 %-70 %] 50 % Set Rate:  [15 bmp] 15 bmp Vt Set:  [400 mL] 400 mL PEEP:  [12 cmH20] 12 cmH20 Plateau Pressure:  [17 cmH20-28 cmH20] 17 cmH20   Intake/Output Summary (Last 24 hours) at 04/02/2018 0930 Last data filed at 04/02/2018 0900 Gross per 24 hour  Intake 2716.36 ml  Output 2730 ml  Net -13.64 ml   Filed Weights   03/31/18 0500 04/01/18 0500 04/02/18 0500  Weight: 74.3 kg 74.1 kg 72.5 kg    Examination:  General: moderately overweight, fluid pos, sedated on  vent HENT: ETT and OGt in place. No ulceration PULM: No asynchrony, course breath sounds throughout CV: tachy, afib w/o RVR, +2 edema MSK: no lesions to exposed skin Neuro: Sedation RASS -3    Assessment & Plan:  Remains critically ill due to Acute on chronic respiratory failure with hypoxemia, w/ pulm HTN (at baseline and worsened by acute respiratory failure.  > Requires titration of mechanical ventilation to maintain gas exchange with lung protective strategy. > Tolerating progressive reduction in FiO2. Once FiO2 of 0.4 start slow PEEP wean. Monitor for decompensation. > VAP prevention >still attempt to diurese, only -0.16L on lasix infusion  Remains critically ill due to agitation resulting respiratory decompensation. -unchanged 10/29 > Continue to titrate Precedex and fentanyl to respiratory comfort primarily. > As respiratory status improves, will begin to see if she can tolerate lower amounts of sedation  > Will start Seroquel to help with Precedex weaning.  CAP due to metapneumovirus.  Anticipate slow recovery. > antibiotics stopped > consider course of prednisone for early late ARDS if fails to respond to diuresis.  MR-CoNS in 50% of vials, likely contaminant. -do not plan on restarting vanc at this point  AKI: CVL to measure CVP for volume assessment, urine output improving but lasix infusion only produced -0.16L for day, still net pos 4.5L which is problem for pulm HTN.   > Monitor BMET and UOP > Replace electrolytes as needed (40k on 10/29) >pharm to concentrate meds to decrease fluid intake >  furosemide infusion and titrate to achieve negative fluid balance.  Transaminitis: trending down  AST 112, ALT 159.  Possibly congestive hepatopathy vs shock liver from prior hypotension -trend CMP  Anemia: Hgb 6.7 early 10/29 1u pRBC ordered by elink, stable.  S/p 1u transfusion 10/25. Likely  Acute ill related and and due to dilution. -on subq heparin -patient had been on iron  325 daily outpatient, will weigh restarting   Afib baseline: currently rate controlled > d/c'd amiodarone > d/c'd eliquis  Hyperglycemia: now with adequate control. > Continue current Lantus + moderate SSI.  Pulmonary Sarcoidosis  > continue bronchodilators scheduled   OSA by history. > will need CPAP post extubation  Disposition / Summary of Today's Plan 04/02/18   Read above    Diet: cont tube feeding  Pain: Anxiety/Delirium protocol (if indicated): RASS goal -2 VAP protocol (if indicated): yes DVT prophylaxis: [place] heparin.  GI prophylaxis: famotidine Hyperglycemia protocol: cont SSI q4h, increase lantus to 12 on 10/30 (already received 10/29 dose) Mobility: bed rest Code Status: full Family Communication: attending updated her sons Mitzi Hansen and Jeneen Rinks 10/22 bedside, they desire full code.  Family member jasmine confirmed this on phone 10/29.  Attending has expressed concern for her overall grave prognosis and explained they think her likelihood of surviving 30 days is < 20%  Ancillary tests   Mild hypernatremia 149, stable Creatinine stable at 2.9 (has been 2.6-2.9 Improving AST, ALT HB at 6.7, 1u pRBC ordered    Critical care time:     Sherene Sires, [place] 04/02/2018 9:49 AM    04/02/2018 9:30 AM

## 2018-04-03 ENCOUNTER — Inpatient Hospital Stay (HOSPITAL_COMMUNITY): Payer: Medicare Other

## 2018-04-03 DIAGNOSIS — J9601 Acute respiratory failure with hypoxia: Secondary | ICD-10-CM

## 2018-04-03 LAB — CBC
HEMATOCRIT: 25.7 % — AB (ref 36.0–46.0)
Hemoglobin: 8.1 g/dL — ABNORMAL LOW (ref 12.0–15.0)
MCH: 26 pg (ref 26.0–34.0)
MCHC: 31.5 g/dL (ref 30.0–36.0)
MCV: 82.6 fL (ref 80.0–100.0)
NRBC: 0.3 % — AB (ref 0.0–0.2)
PLATELETS: 110 10*3/uL — AB (ref 150–400)
RBC: 3.11 MIL/uL — ABNORMAL LOW (ref 3.87–5.11)
RDW: 17.1 % — AB (ref 11.5–15.5)
WBC: 11.7 10*3/uL — ABNORMAL HIGH (ref 4.0–10.5)

## 2018-04-03 LAB — GLUCOSE, CAPILLARY
GLUCOSE-CAPILLARY: 150 mg/dL — AB (ref 70–99)
GLUCOSE-CAPILLARY: 169 mg/dL — AB (ref 70–99)
GLUCOSE-CAPILLARY: 175 mg/dL — AB (ref 70–99)
Glucose-Capillary: 176 mg/dL — ABNORMAL HIGH (ref 70–99)
Glucose-Capillary: 156 mg/dL — ABNORMAL HIGH (ref 70–99)
Glucose-Capillary: 150 mg/dL — ABNORMAL HIGH (ref 70–99)

## 2018-04-03 LAB — COMPREHENSIVE METABOLIC PANEL
ALT: 134 U/L — ABNORMAL HIGH (ref 0–44)
ANION GAP: 12 (ref 5–15)
AST: 89 U/L — ABNORMAL HIGH (ref 15–41)
Albumin: 1.9 g/dL — ABNORMAL LOW (ref 3.5–5.0)
Alkaline Phosphatase: 104 U/L (ref 38–126)
BILIRUBIN TOTAL: 0.7 mg/dL (ref 0.3–1.2)
BUN: 145 mg/dL — ABNORMAL HIGH (ref 8–23)
CHLORIDE: 109 mmol/L (ref 98–111)
CO2: 30 mmol/L (ref 22–32)
Calcium: 9.1 mg/dL (ref 8.9–10.3)
Creatinine, Ser: 2.63 mg/dL — ABNORMAL HIGH (ref 0.44–1.00)
GFR calc Af Amer: 19 mL/min — ABNORMAL LOW (ref >60)
GFR calc non Af Amer: 17 mL/min — ABNORMAL LOW (ref >60)
Glucose, Bld: 189 mg/dL — ABNORMAL HIGH (ref 70–99)
POTASSIUM: 4.3 mmol/L (ref 3.5–5.1)
Sodium: 151 mmol/L — ABNORMAL HIGH (ref 135–145)
TOTAL PROTEIN: 5.4 g/dL — AB (ref 6.5–8.1)

## 2018-04-03 LAB — TYPE AND SCREEN
ABO/RH(D): O POS
ANTIBODY SCREEN: NEGATIVE
UNIT DIVISION: 0

## 2018-04-03 LAB — BPAM RBC
Blood Product Expiration Date: 201912012359
ISSUE DATE / TIME: 201910290537
Unit Type and Rh: 5100

## 2018-04-03 LAB — CULTURE, RESPIRATORY W GRAM STAIN: Culture: NORMAL

## 2018-04-03 LAB — CULTURE, BLOOD (ROUTINE X 2)

## 2018-04-03 LAB — CULTURE, RESPIRATORY: SPECIAL REQUESTS: NORMAL

## 2018-04-03 LAB — APTT: APTT: 39 s — AB (ref 24–36)

## 2018-04-03 MED ORDER — FREE WATER
200.0000 mL | Freq: Three times a day (TID) | Status: DC
  Administered 2018-04-03 – 2018-04-04 (×3): 200 mL

## 2018-04-03 MED ORDER — INSULIN GLARGINE 100 UNIT/ML ~~LOC~~ SOLN
14.0000 [IU] | Freq: Every day | SUBCUTANEOUS | Status: DC
  Administered 2018-04-03 – 2018-04-11 (×9): 14 [IU] via SUBCUTANEOUS
  Filled 2018-04-03 (×10): qty 0.14

## 2018-04-03 NOTE — Progress Notes (Signed)
NAME:  Heather Mckay, MRN:  [id number], DOB:  [birth year], LOS: 57 ADMISSION DATE:  03/30/2018, CONSULTATION DATE:  10/21 REFERRING MD:  Sloan Leiter, CHIEF COMPLAINT:  Dyspnea   Brief History   77 y/o female with multiple medical problems including CHF, pulmonary sarcoidosis was admitted on 10/20 to the hospitalist service with dyspnea, myalgias, cough noted to have a positive RVP for metapneumovirus.    Past Medical History  Sarcoidosis, Pulmonary hypertension, CHF, HTN, HLD, GERD, Asthma, infasive ductal carcinoma, MGUS, OSA on CPAP and on home [place] [facility]   10/20 admit 10/21 PCCM consulted 10/22 early AM intubated, in shock 10/25 Hgb 6.5, 1u pRBC ordered  Consults:  10/21 PCCM   Procedures (surgical and bedside):  10/22 ETT  Significant Diagnostic Tests:  12/2016 TTE > RVSP 69 mmHb, LVEF 60-65%, mod LVH, normal left atrium  Micro Data:  RVP 10/20 >> positive for metapneumovirus  BCx2 10/20 >> ngx5days Sputum 10/20 >> MRSA pos Trach aspirate 10/22 >> Trach asiprate 10/28 >> Blood cx (left) 10/28 >> , 50% blood cx MR-CoNS, likely contaminant >> Blood cx left #2 10/28 >>  Antimicrobials:  Ceftriaxone 10/20 >>10/24 Azithro 10/20 >> 10/24 Zosyn/vanc x1 dose 10/21  Subjective:  Increasing hypernatremia Still needing high respiratory support  Objective   Blood pressure (!) 134/57, pulse (!) 103, temperature 98.3 F (36.8 C), temperature source Axillary, resp. rate 16, height 5\' 2"  (1.575 m), weight 75.3 kg, SpO2 95 %. CVP:  [15 mmHg-16 mmHg] 16 mmHg  Vent Mode: PRVC FiO2 (%):  [50 %-70 %] 70 % Set Rate:  [15 bmp] 15 bmp Vt Set:  [400 mL] 400 mL PEEP:  [12 cmH20] 12 cmH20   Intake/Output Summary (Last 24 hours) at 04/03/2018 0845 Last data filed at 04/03/2018 0800 Gross per 24 hour  Intake 2268.06 ml  Output 3945 ml  Net -1676.94 ml   Filed Weights   04/01/18 0500 04/02/18 0500 04/03/18 0500  Weight: 74.1 kg 72.5 kg 75.3 kg     Examination: General: moderately overweight, sedated on vent, eyes opening but not responsive HENT: ETT and OGt in place. No ulceration PULM: No asynchrony, course breath sounds throughout CV: tachy, afib w/o RVR, +2 edema MSK: no lesions to exposed skin Neuro: Sedation RASS -4    Assessment & Plan:  Remains critically ill due to Acute on chronic respiratory failure with hypoxemia, w/ pulm HTN (at baseline and worsened by acute respiratory failure.  > Requires titration of mechanical ventilation to maintain gas exchange with lung protective strategy. > Tolerating progressive reduction in FiO2. Once FiO2 of 0.4 start slow PEEP wean. Monitor for decompensation. > VAP prevention >still attempt to diurese, only -1.4L on 24hr lasix infusion, now up 1.4L total this admission.  Weights charted as up 20lbs this admission but timing of admission from ED makes me think she is only up ~10lbs due to scale discrepency  Remains critically ill due to agitation resulting respiratory decompensation. -unchanged 10/30 > Continue to titrate Precedex and fentanyl to respiratory comfort primarily. > As respiratory status improves, will begin to see if she can tolerate lower amounts of sedation  > Will start Seroquel to help with Precedex weaning.  CAP due to metapneumovirus.  Anticipate slow recovery. > antibiotics stopped > consider course of prednisone for early late ARDS if fails to respond to diuresis.  MR-CoNS in 50% of vials, likely contaminant. -do not plan on restarting vanc at this point  AKI: stable,  CVL to measure CVP for  volume assessment, urine output good on lasix infusion, still net pos 1.4L > Monitor BMET and UOP > Replace electrolytes as needed >pharm to concentrate meds to decrease fluid intake >furosemide infusion and titrate to achieve negative fluid balance.  Hypernatremia: now 151, discussed feed with dietary and she is already on largest free water formulation. -will  consider adding some free water as she was -1.4L 10/29  Transaminitis: trending down  AST 112, ALT 159.  Possibly congestive hepatopathy vs shock liver from prior hypotension -trend CMP  Anemia: Hgb 8.1.  S/p 2u pRBC this admission. Likely  Acute illness related vs new severe bleed given stabiliy -on subq heparin -patient had been on iron 325 daily outpatient, will weigh restarting   Afib baseline: currently rate controlled > d/c'd amiodarone > d/c'd eliquis  Hyperglycemia: now with adequate control. > increase Lantus to 14 >cont  moderate SSI.  Pulmonary Sarcoidosis  > continue bronchodilators scheduled   OSA by history. > will need CPAP post extubation  Disposition / Summary of Today's Plan 04/03/18   Read above    Diet: cont tube feeding  Pain: Anxiety/Delirium protocol (if indicated): RASS goal -2 VAP protocol (if indicated): yes DVT prophylaxis: [place] heparin.  GI prophylaxis: famotidine Hyperglycemia protocol: cont SSI q4h, increase lantus to 14 Mobility: bed rest Code Status: full Family Communication: attending updated her sons Mitzi Hansen and Jeneen Rinks 10/22 bedside, they desire full code.  Family member jasmine confirmed this on phone 10/29.  Attending has expressed concern for her overall grave prognosis and explained they think her likelihood of surviving 30 days is < 20%  Ancillary tests   hypernatremia increased to 151, stable Creatinine stable at 2.63 (has been 2.6-2.9 Steady Improving AST, ALT HB at 8.1  Critical care time:     Sherene Sires, [place] 04/03/2018 8:45 AM    04/03/2018 8:45 AM

## 2018-04-04 DIAGNOSIS — J9601 Acute respiratory failure with hypoxia: Secondary | ICD-10-CM

## 2018-04-04 LAB — CBC
HCT: 23.1 % — ABNORMAL LOW (ref 36.0–46.0)
Hemoglobin: 7.2 g/dL — ABNORMAL LOW (ref 12.0–15.0)
MCH: 26.1 pg (ref 26.0–34.0)
MCHC: 31.2 g/dL (ref 30.0–36.0)
MCV: 83.7 fL (ref 80.0–100.0)
NRBC: 0.3 % — AB (ref 0.0–0.2)
PLATELETS: 107 10*3/uL — AB (ref 150–400)
RBC: 2.76 MIL/uL — ABNORMAL LOW (ref 3.87–5.11)
RDW: 17.7 % — ABNORMAL HIGH (ref 11.5–15.5)
WBC: 12 10*3/uL — AB (ref 4.0–10.5)

## 2018-04-04 LAB — BLOOD GAS, ARTERIAL
ACID-BASE EXCESS: 7.4 mmol/L — AB (ref 0.0–2.0)
Bicarbonate: 32.8 mmol/L — ABNORMAL HIGH (ref 20.0–28.0)
DRAWN BY: 270271
FIO2: 70
O2 SAT: 96.8 %
PATIENT TEMPERATURE: 98.6
PCO2 ART: 60.5 mmHg — AB (ref 32.0–48.0)
PEEP/CPAP: 12 cmH2O
PH ART: 7.354 (ref 7.350–7.450)
RATE: 15 resp/min
VT: 400 mL
pO2, Arterial: 96 mmHg (ref 83.0–108.0)

## 2018-04-04 LAB — COMPREHENSIVE METABOLIC PANEL
ALBUMIN: 2 g/dL — AB (ref 3.5–5.0)
ALT: 111 U/L — ABNORMAL HIGH (ref 0–44)
ANION GAP: 7 (ref 5–15)
AST: 76 U/L — AB (ref 15–41)
Alkaline Phosphatase: 110 U/L (ref 38–126)
BUN: 150 mg/dL — AB (ref 8–23)
CHLORIDE: 112 mmol/L — AB (ref 98–111)
CO2: 33 mmol/L — ABNORMAL HIGH (ref 22–32)
Calcium: 9.1 mg/dL (ref 8.9–10.3)
Creatinine, Ser: 2.73 mg/dL — ABNORMAL HIGH (ref 0.44–1.00)
GFR calc Af Amer: 18 mL/min — ABNORMAL LOW (ref >60)
GFR calc non Af Amer: 16 mL/min — ABNORMAL LOW (ref >60)
GLUCOSE: 135 mg/dL — AB (ref 70–99)
Potassium: 4.2 mmol/L (ref 3.5–5.1)
Sodium: 152 mmol/L — ABNORMAL HIGH (ref 135–145)
Total Bilirubin: 0.6 mg/dL (ref 0.3–1.2)
Total Protein: 5.6 g/dL — ABNORMAL LOW (ref 6.5–8.1)

## 2018-04-04 LAB — GLUCOSE, CAPILLARY
GLUCOSE-CAPILLARY: 123 mg/dL — AB (ref 70–99)
GLUCOSE-CAPILLARY: 125 mg/dL — AB (ref 70–99)
GLUCOSE-CAPILLARY: 156 mg/dL — AB (ref 70–99)
GLUCOSE-CAPILLARY: 183 mg/dL — AB (ref 70–99)
Glucose-Capillary: 106 mg/dL — ABNORMAL HIGH (ref 70–99)
Glucose-Capillary: 121 mg/dL — ABNORMAL HIGH (ref 70–99)
Glucose-Capillary: 181 mg/dL — ABNORMAL HIGH (ref 70–99)

## 2018-04-04 LAB — APTT: APTT: 40 s — AB (ref 24–36)

## 2018-04-04 MED ORDER — FREE WATER: 200.0000 mL | Freq: Four times a day (QID) | Status: DC | PRN

## 2018-04-04 MED ORDER — FREE WATER
200.0000 mL | Freq: Four times a day (QID) | Status: DC
  Administered 2018-04-04 – 2018-04-05 (×5): 200 mL

## 2018-04-04 MED ORDER — SODIUM CHLORIDE 0.9 % IV SOLN
0.5000 mg/h | INTRAVENOUS | Status: DC
  Administered 2018-04-04: 2 mg/h via INTRAVENOUS
  Administered 2018-04-05: 1 mg/h via INTRAVENOUS
  Administered 2018-04-06: 2 mg/h via INTRAVENOUS
  Filled 2018-04-04 (×4): qty 5

## 2018-04-04 MED ORDER — AMIODARONE HCL IN DEXTROSE 360-4.14 MG/200ML-% IV SOLN
INTRAVENOUS | Status: AC
  Administered 2018-04-04: 60 mg/h via INTRAVENOUS
  Filled 2018-04-04: qty 200

## 2018-04-04 MED ORDER — METOLAZONE 10 MG PO TABS
5.0000 mg | ORAL_TABLET | Freq: Once | ORAL | Status: AC
  Administered 2018-04-04: 5 mg via ORAL
  Filled 2018-04-04: qty 1

## 2018-04-04 MED ORDER — AMIODARONE HCL IN DEXTROSE 360-4.14 MG/200ML-% IV SOLN
60.0000 mg/h | INTRAVENOUS | Status: AC
  Administered 2018-04-04 (×2): 60 mg/h via INTRAVENOUS
  Filled 2018-04-04: qty 200

## 2018-04-04 MED ORDER — AMIODARONE LOAD VIA INFUSION
150.0000 mg | Freq: Once | INTRAVENOUS | Status: AC
  Administered 2018-04-04: 150 mg via INTRAVENOUS
  Filled 2018-04-04: qty 83.34

## 2018-04-04 MED ORDER — AMIODARONE HCL IN DEXTROSE 360-4.14 MG/200ML-% IV SOLN
30.0000 mg/h | INTRAVENOUS | Status: DC
  Administered 2018-04-04 – 2018-04-05 (×2): 30 mg/h via INTRAVENOUS
  Filled 2018-04-04 (×2): qty 200

## 2018-04-04 NOTE — Progress Notes (Signed)
Nutrition Follow-up  DOCUMENTATION CODES:   Not applicable  INTERVENTION:   Tube Feeding:  Continue Vital High Protein @ 50 ml/hr Provides 1200 kcals, 106 g of protein and 1008 mL of free water  Total free water from TF and free water flushes as ordered (200 mL q 6 hours): 1808 mL of free water  Pt will likely need further increase in free water to make up for free water deficit   NUTRITION DIAGNOSIS:   Inadequate oral intake related to acute illness as evidenced by NPO status.  Being addressed via TF   GOAL:   Patient will meet greater than or equal to 90% of their needs  Met  MONITOR:   TF tolerance, Labs, Weight trends, Vent status  REASON FOR ASSESSMENT:   Consult Enteral/tube feeding initiation and management  ASSESSMENT:   77 yo female admitted with acute on chronic respiratory failure requiring intubation on 10/22 with CAP, pulmonary sarcoidosis and OSA; pt also with AKI, in shock, likely cardiogenic. Additional  PMH includes CHF, pulmonary HTN, HLD, GERD, invasive ductal carcinoma  Patient is currently intubated on ventilator support MV: 8.3 L/min Temp (24hrs), Avg:98.4 F (36.9 C), Min:98.1 F (36.7 C), Max:98.9 F (37.2 C)  BUN/Creatinine trending up, hypernatremia worse. Calculated free water deficit of 3.5 L (based on EDW 67 kg)  Vital High Protein @ 50 ml/hr, free water flush of 200 mL increased from q 8 hours to q 6 hours; total free water 1808 mL  New stage II pressure injury to sacrum documented on 10/29  Labs: CBGs 106-169, sodium 152 (H), BUN 150, Creatinine 2.73 Meds: lasix drip, fentanyl, precedex  Diet Order:   Diet Order            Diet NPO time specified  Diet effective now              EDUCATION NEEDS:   Not appropriate for education at this time  Skin:  Skin Assessment: Skin Integrity Issues: Skin Integrity Issues:: Stage II Stage II: sacrum  Last BM:  10/27 rectal tube  Height:   Ht Readings from Last 1  Encounters:  03/26/18 5' 2"  (1.575 m)    Weight:   Wt Readings from Last 1 Encounters:  04/04/18 73.6 kg    Ideal Body Weight:  50 kg   BMI:  Body mass index is 29.68 kg/m.  Estimated Nutritional Needs:   Kcal:  1300 kcals  Protein:  100-115 g   Fluid:  >/= 1.5 L   Kerman Passey MS, RD, LDN, CNSC [phone] Pager  [phone] Weekend/On-Call Pager

## 2018-04-04 NOTE — Progress Notes (Signed)
NAME:  Heather Mckay, MRN:  [id number], DOB:  07-May-1941, LOS: 44 ADMISSION DATE:  03/18/2018, CONSULTATION DATE:  10/21 REFERRING MD:  Sloan Leiter, CHIEF COMPLAINT:  Dyspnea   Brief History   77 y/o female with multiple medical problems including CHF, pulmonary sarcoidosis was admitted on 10/20 to the hospitalist service with dyspnea, myalgias, cough noted to have a positive RVP for metapneumovirus.    Past Medical History  Sarcoidosis, Pulmonary hypertension, CHF, HTN, HLD, GERD, Asthma, infasive ductal carcinoma, MGUS, OSA on CPAP and on home [place] [facility]   10/20 admit 10/21 PCCM consulted 10/22 early AM intubated, in shock 10/25 Hgb 6.5, 1u pRBC ordered  Consults:  10/21 PCCM   Procedures (surgical and bedside):  10/22 ETT  Significant Diagnostic Tests:  12/2016 TTE > RVSP 69 mmHb, LVEF 60-65%, mod LVH, normal left atrium  Micro Data:  RVP 10/20 >> positive for metapneumovirus  BCx2 10/20 >> ngx5days Sputum 10/20 >> MRSA pos Trach aspirate 10/22 >> neg Trach asiprate 10/28 >> neg Blood cx (left) 10/28 >> , 50% blood cx MR-CoNS, likely contaminant >> Blood cx left #2 10/28 >>  Antimicrobials:  Ceftriaxone 10/20 >>10/24 Azithro 10/20 >> 10/24 Zosyn/vanc x1 dose 10/21  Subjective:  Increasing hypernatremia Still needing high respiratory support  Objective   Blood pressure (!) 162/59, pulse (!) 107, temperature 98.2 F (36.8 C), temperature source Oral, resp. rate 17, height 5\' 2"  (1.575 m), weight 73.6 kg, SpO2 99 %.    Vent Mode: PRVC FiO2 (%):  [60 %-100 %] 100 % Set Rate:  [15 bmp] 15 bmp Vt Set:  [400 mL] 400 mL PEEP:  [12 cmH20] 12 cmH20 Plateau Pressure:  [12 cmH20-21 cmH20] 18 cmH20   Intake/Output Summary (Last 24 hours) at 04/04/2018 1004 Last data filed at 04/04/2018 0800 Gross per 24 hour  Intake 1832.62 ml  Output 2705 ml  Net -872.38 ml   Filed Weights   04/02/18 0500 04/03/18 0500 04/04/18 0500  Weight: 72.5 kg 75.3 kg 73.6  kg    Examination: General: overweight, sedated, eyes opening but unresponsive rass -4 HENT: ETT and OGt in place. No ulceration PULM: synchronous breathing, diffuse rhonchi CV: tachy, afib w/o RVR, +2 edema MSK: no lesions to exposed skin, sacral ulcer Neuro: rass -4   Assessment & Plan:  Remains critically ill due to Acute on chronic respiratory failure with hypoxemia, w/ pulm HTN (at baseline and worsened by acute respiratory failure.  > Requires titration of mechanical ventilation to maintain gas exchange with lung protective strategy. > *decompensates on right side, try to favor left and use bag lavage/bed chest pt > VAP prevention >still attempt to diurese, again down -1.4L on last 24hr lasix infusion, now down 1.3L total this admission.  But Weights charted as up 6lbs since getting to ICU  Remains critically ill due to agitation resulting respiratory decompensation. Unchanged 10/31. Now tubed 10 days > Continue to titrate Precedex and fentanyl to respiratory comfort primarily. > As respiratory status improves, will begin to see if she can tolerate lower amounts of sedation  > Will start Seroquel to help with Precedex weaning.  CAP due to metapneumovirus.  Anticipate slow recovery. > antibiotics stopped > consider course of prednisone for early late ARDS if fails to respond to diuresis.  MR-CoNS in 50% of vials, likely contaminant. -do not plan on restarting vanc at this point  AKI: stable,  CVL to measure CVP for volume assessment, urine output good on lasix infusion, still net  pos 1.4L > Monitor BMET and UOP > Replace electrolytes as needed >pharm to concentrate meds to decrease fluid intake >furosemide infusion and titrate to achieve negative fluid balance.  Hypernatremia: now 152, added free water 10/30.  -will increase to Q6 from Q8 283ml free water flush -consider adding IVF now that approaching fluid neutral  Transaminitis: trending down  AST 76, ALT 111.  Likely  due to congestive hepatopathy vs shock liver from prior hypotension -trend CMP  Anemia: Hgb 7.2.  Patient fluctuates Hgb7-9.  .S/p 2u pRBC this admission. Likely  Acute illness related vs new severe bleed given stabiliy -on subq heparin -patient had been on iron 325 daily outpatient, will weigh restarting   Afib baseline: currently rate controlled > d/c'd amiodarone > d/c'd eliquis  Hyperglycemia: now with adequate control. >cont Lantus to 14 >cont moderate SSI.  Pulmonary Sarcoidosis  > continue bronchodilators scheduled   OSA by history. > will need CPAP post extubation  Disposition / Summary of Today's Plan 04/04/18   Read above    Diet: cont tube feeding  Pain: Anxiety/Delirium protocol (if indicated): RASS goal -2 VAP protocol (if indicated): yes DVT prophylaxis: [place] heparin.  GI prophylaxis: famotidine Hyperglycemia protocol: cont SSI q4h, cont lantus to 14 Mobility: bed rest Code Status: full Family Communication: attending updated her sons Mitzi Hansen and Jeneen Rinks 10/22 bedside, they desire full code.  Family member jasmine confirmed this on phone 10/29.  Attending has expressed concern for her overall grave prognosis and explained they think her likelihood of surviving 30 days is < 20%  Ancillary tests   hypernatremia increased to 152, will increase daily free water Creatinine stable at 2.7 (has been 2.6-2.9) Steady Improving AST, ALT HB at 7.2, no clinical bleed indication  Critical care time:     Sherene Sires, [place] 04/04/2018 10:04 AM

## 2018-04-04 NOTE — Progress Notes (Signed)
CPT held at this time due to new onset Afib with RVR and significantly increased HR. MD aware

## 2018-04-04 NOTE — Progress Notes (Signed)
Pt spo2 dropped to 83% with a good wave form without improvement so FIO2 was increased to 100%. Pt also lavaged and suctioned with a small amount of pink tinged thin secretions. RN aware

## 2018-04-05 LAB — CBC
HEMATOCRIT: 23.8 % — AB (ref 36.0–46.0)
Hemoglobin: 7.3 g/dL — ABNORMAL LOW (ref 12.0–15.0)
MCH: 25.9 pg — AB (ref 26.0–34.0)
MCHC: 30.7 g/dL (ref 30.0–36.0)
MCV: 84.4 fL (ref 80.0–100.0)
NRBC: 0.2 % (ref 0.0–0.2)
Platelets: 120 10*3/uL — ABNORMAL LOW (ref 150–400)
RBC: 2.82 MIL/uL — AB (ref 3.87–5.11)
RDW: 17.7 % — AB (ref 11.5–15.5)
WBC: 14.8 10*3/uL — AB (ref 4.0–10.5)

## 2018-04-05 LAB — APTT: aPTT: 43 seconds — ABNORMAL HIGH (ref 24–36)

## 2018-04-05 LAB — GLUCOSE, CAPILLARY
GLUCOSE-CAPILLARY: 138 mg/dL — AB (ref 70–99)
Glucose-Capillary: 148 mg/dL — ABNORMAL HIGH (ref 70–99)
Glucose-Capillary: 130 mg/dL — ABNORMAL HIGH (ref 70–99)
Glucose-Capillary: 134 mg/dL — ABNORMAL HIGH (ref 70–99)
Glucose-Capillary: 160 mg/dL — ABNORMAL HIGH (ref 70–99)
Glucose-Capillary: 139 mg/dL — ABNORMAL HIGH (ref 70–99)

## 2018-04-05 MED ORDER — FREE WATER
300.0000 mL | Freq: Four times a day (QID) | Status: DC
  Administered 2018-04-06 – 2018-04-08 (×10): 300 mL

## 2018-04-05 MED ORDER — FERROUS SULFATE 75 (15 FE) MG/ML PO SOLN
60.0000 mg | Freq: Every day | ORAL | Status: DC
  Administered 2018-04-05 – 2018-04-11 (×7): 60 mg
  Filled 2018-04-05 (×10): qty 4

## 2018-04-05 MED ORDER — AMIODARONE HCL 200 MG PO TABS
200.0000 mg | ORAL_TABLET | Freq: Every day | ORAL | Status: DC
  Administered 2018-04-05 – 2018-04-11 (×7): 200 mg
  Filled 2018-04-05 (×7): qty 1

## 2018-04-05 MED ORDER — HEPARIN SODIUM (PORCINE) 5000 UNIT/ML IJ SOLN
5000.0000 [IU] | Freq: Three times a day (TID) | INTRAMUSCULAR | Status: DC
  Administered 2018-04-05 – 2018-04-08 (×10): 5000 [IU] via SUBCUTANEOUS
  Filled 2018-04-05 (×10): qty 1

## 2018-04-05 NOTE — Progress Notes (Signed)
Follow up - Critical Care Medicine Note  Patient Details:    Heather Flegal is an 77 y.o. female admitted for respiratory failure due to metapneumovirus pneumonia on a background of pulmonary sarcoidosis.  She has failed to improve after 10 days of mechanical ventilation.    Subjective:    Overnight Issues: No further episodes of desaturation.  Nursing has been avoided turns to her right side.  He has tolerated FiO2 weaning.  Objective:   Physical Exam:   General: Normotensive, not requiring vasopressors.  In normal sinus rhythm on amiodarone infusion. HEENT: Normal sclera.  ETT/OGT in place with no pressure ulceration. Respiratory: On PRVC ventilation with no asynchrony.  Appears more comfortable on ventilator today.  Left chest clear to auscultation.  Rhonchi have resolved.  Crackles now audible to posterior axillary line on the right. Cardiovascular: Sinus rhythm.  Warm extremities.  First and second heart sounds unremarkable. Abdomen: Soft and nontender, tolerating tube feeds. Neurological: Remains stuporous.  Opens eyes but does not follow commands.  No response to painful stimuli.  On dexmedetomidine and fentanyl infusion.  Skin: line sites intact Extemities: Mild edema  Assessment/Plan:   Remains critically ill due to severe respiratory failure wearing mechanical ventilation. Severe hypoxia with high FiO2 and PEEP requirements. Metapneumovirus pneumonia, treatment if supportive. Limited, but some response to diuresis. Encephalopathy secondary to sedation and possible delirium. Atrial fibrillation with rapid ventricular response, now resolved  Plan:  Continue slow weaning of FiO2.  Commence weaning PEEP once FiO2 0.4 achieved by decreasing by 2 cm of water every 12 hours as tolerated.  At high risk for recurrent hypoxia. Hold further active diuresis for now, but maintain euvolemia. Continue current sedation regimen to ensure ventilator synchrony. Convert to oral  amiodarone.  CRITICAL CARE Performed by: Kipp Brood   Total critical care time: 35 minutes  Critical care time was exclusive of separately billable procedures and treating other patients.  Critical care was necessary to treat or prevent imminent or life-threatening deterioration.  Critical care was time spent personally by me on the following activities: development of treatment plan with patient and/or surrogate as well as nursing, discussions with consultants, evaluation of patient's response to treatment, examination of patient, obtaining history from patient or surrogate, ordering and performing treatments and interventions, ordering and review of laboratory studies, ordering and review of radiographic studies, pulse oximetry, re-evaluation of patient's condition and participation in multidisciplinary rounds.  Kipp Brood, MD [facility] ICU Physician [place]  Pager: [phone] Mobile: [phone] After hours: [phone].

## 2018-04-05 NOTE — Progress Notes (Addendum)
NAME:  Heather Mckay, MRN:  [id number], DOB:  [birth year], LOS: 12 ADMISSION DATE:  03/22/2018, CONSULTATION DATE:  10/21 REFERRING MD:  Sloan Leiter, CHIEF COMPLAINT:  Dyspnea   Brief History   77 y/o female with CHF, pulmonary sarcoidosis was admitted on 10/20 to the hospitalist service with dyspnea, myalgias, cough noted to have a positive RVP for metapneumovirus. Concern for LLL CAP.  Intubated with septic shock 10/22.     Past Medical History  Sarcoidosis, Pulmonary hypertension, CHF, HTN, HLD, GERD, Asthma, infasive ductal carcinoma, MGUS, OSA on CPAP and on home [place] [facility]   10/20 Admit 10/21 PCCM consulted 10/22 Early AM intubated, in shock 10/25 Hgb 6.5, 1u pRBC ordered  Consults:  10/21 PCCM   Procedures (surgical and bedside):  10/22 ETT  Significant Diagnostic Tests:  12/2016 TTE > RVSP 69 mmHb, LVEF 60-65%, mod LVH, normal left atrium 10/22 TTE >> 55-60%, no wall motion abnormalities, tricuspid valve thickening c/w rheumatic disease, moderate regurgitation, PA peak pressure 64 Micro Data:  RVP 10/20 >> positive for metapneumovirus  BCx2 10/20 >> ngx5days Sputum 10/20 >> MRSA pos  Antimicrobials:  Ceftriaxone 10/20 >>10/24 Azithro 10/20 >> 10/24 Zosyn/vanc x1 dose 10/21  Subjective:  RN reports pt remains on dilaudid gtt.  Pink frothy secretions from ETT.  No evidence of bleeding.  Afebrile.  I/O net 7.8L positive.   Objective   Blood pressure (!) 147/60, pulse 92, temperature 98.2 F (36.8 C), temperature source Axillary, resp. rate 18, height 5\' 2"  (1.575 m), weight 71.6 kg, SpO2 100 %.    Vent Mode: PRVC FiO2 (%):  [70 %-100 %] 70 % Set Rate:  [15 bmp] 15 bmp Vt Set:  [400 mL] 400 mL PEEP:  [12 cmH20] 12 cmH20 Plateau Pressure:  [21 cmH20-31 cmH20] 26 cmH20   Intake/Output Summary (Last 24 hours) at 04/05/2018 1219 Last data filed at 04/05/2018 1113 Gross per 24 hour  Intake 2599.05 ml  Output 4025 ml  Net -1425.95 ml   Filed Weights    04/03/18 0500 04/04/18 0500 04/05/18 0500  Weight: 75.3 kg 73.6 kg 71.6 kg    Examination:  General: ill appearing female lying in bed in NAD HEENT: MM pink/moist, ETT, R IJ TLC c/d/i  Neuro: awake, alert, nods to questions, MAE/generalized weakness  CV: s1s2 rrr, no m/r/g PULM: even/non-labored, lungs bilaterally coarse bilaterally, diminished bases with crackles EG:BTDV, non-tender, bsx4 active  Extremities: warm/dry, no edema  Skin: no rashes or lesions  Assessment & Plan:   Acute on Chronic Respiratory Failure with Hypoxemia - in setting of metapneumovirus, CAP and baseline pulmonary hypertension, sarcoidosis  P: PRVC 8cc/kg  Wean PEEP / FiO2 for sats >90% VAP prevention measures  Lasix gtt as ordered  Follow intermittent CXR  Community Acquired PNA secondary to Metapneumovirus  P: Monitor off abx  ? Superimposed bacterial PNA post viral infection given right sided infiltrate   Agitated Delirium  P: Precedex gtt  Dilaudid gtt  Continue seroquel   AKI P: Trend BMP / urinary output Replace electrolytes as indicated Avoid nephrotoxic agents, ensure adequate renal perfusion Stop lasix gtt with AKI/rising BUN   Electrolyte Disturbances  -hypernatremia, hyperchloremia P: Increase free water to 300 ml PT Q6 Follow, replace as appropriate   Transaminitis - Possibly congestive hepatopathy vs shock liver from prior hypotension P: Follow LFT's  Anemia - suspect related to acute illness + dilution, no evidence of bleeding  P: Trend CBC  Monitor for bleeding  Transfuse per ICU guidelines  Atrial Fibrillation  -amiodarone, eliquis pre-admit P: Hold home eliquis  Adjust heparin SQ dosing to Q8 Hold heparin gtt for now, in NSR / anemia requiring transfusion on admit Discontinue amiodarone gtt  Hyperglycemia - well controlled  P: Continue lantus 14 units  SSI, moderate scale   Pulmonary Sarcoidosis  P: Supportive care  OSA  P: Will need CPAP  post extubation    Disposition / Summary of Today's Plan 04/05/18   As above    Diet: cont tube feeding  Pain: Anxiety/Delirium protocol (if indicated): RASS goal 0 to -1 VAP protocol (if indicated): yes DVT prophylaxis: SQ heparin.  GI prophylaxis: famotidine Hyperglycemia protocol: SSI Mobility: bed rest Code Status: full Family Communication: No family at bedside pm 11/1.  Will  Update on arrival  Ancillary tests   Recent Labs  Lab 04/03/18 0552 04/04/18 0400 04/05/18 0438  HGB 8.1* 7.2* 7.3*  HCT 25.7* 23.1* 23.8*  WBC 11.7* 12.0* 14.8*  PLT 110* 107* 120*   Recent Labs  Lab 04/01/18 0452 04/01/18 1944 04/02/18 0323 04/03/18 0552 04/04/18 0400  NA 149* 148* 149* 151* 152*  K 3.6 4.2 3.4* 4.3 4.2  CL 112* 111 111 109 112*  CO2 29 27 28 30  33*  GLUCOSE 208* 264* 218* 189* 135*  BUN 119* 132* 132* 145* 150*  CREATININE 2.81* 2.84* 2.90* 2.63* 2.73*  CALCIUM 8.1* 9.0 9.1 9.1 9.1   PCXR 10/30 - R>L airspace disease.    Critical care time:  30 minutes      Noe Gens, NP-C [facility] Pgr: [phone] or if no answer [phone] 04/05/2018, 12:19 PM

## 2018-04-05 DEATH — deceased

## 2018-04-06 ENCOUNTER — Inpatient Hospital Stay (HOSPITAL_COMMUNITY): Payer: Medicare Other

## 2018-04-06 LAB — BLOOD GAS, ARTERIAL
ACID-BASE EXCESS: 11.5 mmol/L — AB (ref 0.0–2.0)
BICARBONATE: 35.5 mmol/L — AB (ref 20.0–28.0)
FIO2: 0.4
MECHVT: 400 mL
O2 Saturation: 91.4 %
PATIENT TEMPERATURE: 98.6
PCO2 ART: 45.9 mmHg (ref 32.0–48.0)
PEEP: 5 cmH2O
PH ART: 7.5 — AB (ref 7.350–7.450)
PO2 ART: 61.7 mmHg — AB (ref 83.0–108.0)
RATE: 15 resp/min

## 2018-04-06 LAB — COMPREHENSIVE METABOLIC PANEL
ALBUMIN: 2 g/dL — AB (ref 3.5–5.0)
ALK PHOS: 139 U/L — AB (ref 38–126)
ALT: 98 U/L — ABNORMAL HIGH (ref 0–44)
ANION GAP: 10 (ref 5–15)
AST: 84 U/L — ABNORMAL HIGH (ref 15–41)
BILIRUBIN TOTAL: 0.8 mg/dL (ref 0.3–1.2)
BUN: 160 mg/dL — ABNORMAL HIGH (ref 8–23)
CALCIUM: 9.2 mg/dL (ref 8.9–10.3)
CO2: 33 mmol/L — AB (ref 22–32)
CREATININE: 2.43 mg/dL — AB (ref 0.44–1.00)
Chloride: 105 mmol/L (ref 98–111)
GFR calc non Af Amer: 18 mL/min — ABNORMAL LOW (ref >60)
GFR, EST AFRICAN AMERICAN: 21 mL/min — AB (ref >60)
Glucose, Bld: 149 mg/dL — ABNORMAL HIGH (ref 70–99)
Potassium: 3.5 mmol/L (ref 3.5–5.1)
Sodium: 148 mmol/L — ABNORMAL HIGH (ref 135–145)
Total Protein: 5.9 g/dL — ABNORMAL LOW (ref 6.5–8.1)

## 2018-04-06 LAB — APTT: aPTT: 47 seconds — ABNORMAL HIGH (ref 24–36)

## 2018-04-06 LAB — GLUCOSE, CAPILLARY
GLUCOSE-CAPILLARY: 141 mg/dL — AB (ref 70–99)
GLUCOSE-CAPILLARY: 182 mg/dL — AB (ref 70–99)
Glucose-Capillary: 120 mg/dL — ABNORMAL HIGH (ref 70–99)
Glucose-Capillary: 175 mg/dL — ABNORMAL HIGH (ref 70–99)
Glucose-Capillary: 158 mg/dL — ABNORMAL HIGH (ref 70–99)
Glucose-Capillary: 161 mg/dL — ABNORMAL HIGH (ref 70–99)

## 2018-04-06 LAB — CBC
HCT: 24.6 % — ABNORMAL LOW (ref 36.0–46.0)
HEMOGLOBIN: 7.5 g/dL — AB (ref 12.0–15.0)
MCH: 25.8 pg — ABNORMAL LOW (ref 26.0–34.0)
MCHC: 30.5 g/dL (ref 30.0–36.0)
MCV: 84.5 fL (ref 80.0–100.0)
Platelets: 145 10*3/uL — ABNORMAL LOW (ref 150–400)
RBC: 2.91 MIL/uL — ABNORMAL LOW (ref 3.87–5.11)
RDW: 18.2 % — AB (ref 11.5–15.5)
WBC: 17.4 10*3/uL — ABNORMAL HIGH (ref 4.0–10.5)
nRBC: 0.1 % (ref 0.0–0.2)

## 2018-04-06 LAB — CULTURE, BLOOD (ROUTINE X 2)
CULTURE: NO GROWTH
Special Requests: ADEQUATE

## 2018-04-06 MED ORDER — QUETIAPINE FUMARATE 100 MG PO TABS
100.0000 mg | ORAL_TABLET | Freq: Every day | ORAL | Status: DC
  Administered 2018-04-06 – 2018-04-07 (×2): 100 mg via ORAL
  Filled 2018-04-06 (×2): qty 1

## 2018-04-06 MED ORDER — HYDROMORPHONE BOLUS VIA INFUSION
0.2000 mg | INTRAVENOUS | Status: DC | PRN
  Administered 2018-04-06 – 2018-04-08 (×4): 0.2 mg via INTRAVENOUS
  Filled 2018-04-06: qty 1

## 2018-04-06 NOTE — Progress Notes (Addendum)
Follow up - Critical Care Medicine Note  Patient Details:    Heather Mckay is an 77 y.o. female admitted for respiratory failure due to metapneumovirus pneumonia on a background of pulmonary sarcoidosis.  She has failed to improve after 10 days of mechanical ventilation and an attempt at diuresis.    Subjective:    Overnight Issues:  Continues to desaturate with turns but recovers faster. Episodes of agitation  Objective:   Physical Exam:   General: Normotensive, not requiring vasopressors.  In normal sinus rhythm on amiodarone infusion.  Afebrile. HEENT: Normal sclera.  ETT/OGT in place with no pressure ulceration. Respiratory: On PRVC ventilation with no asynchrony.  Appears more comfortable on ventilator today.  Left chest clear to auscultation.  Rhonchi on right side.  Crackles now audible to posterior axillary line on the right. Plateau pressures 22. Now down to 0.5/5! Cardiovascular: Sinus rhythm.  Warm extremities.  First and second heart sounds unremarkable. Abdomen: Soft and nontender, tolerating tube feeds. Neurological: Remains stuporous.  Opens eyes but does not follow commands.  No response to painful stimuli.  On dexmedetomidine and fentanyl infusion.  Skin: line sites intact Extemities: Mild edema  Net negative -1.8L for hospitalization.   Ancillary tests:  CBC: increasing leukocytosis at 17.4, anemia is stable at 7.5, improving thrombocytopenia Chemistry: Improving creatinine, metabolic alkalosis possibly respiratory compensation.  Assessment/Plan:   Remains critically ill due to severe respiratory failure wearing mechanical ventilation. Severe hypoxia with high FiO2 and PEEP requirements which are finally improving. Metapneumovirus pneumonia, treatment if supportive. Rising WBC consider superinfection. Limited, but some response to diuresis. Encephalopathy secondary to sedation and possible delirium. Atrial fibrillation with rapid ventricular response, now in  sinus on amiodarone.  Plan:  Attempt to see if patient can lie flat. If can tolerate being off left side, can start SBT. Hold further active diuresis for now, but maintain euvolemia. Respiratory culture to rule out suprainfection. CXR tomorrow. Bronchodilators. Consider steroids for early late ARDS if sputum negative and doesn't continue to improve Start weaning current sedation regimen as oxygenation improves. Increase Seroquel and follow QTc Continue oral amiodarone. Repeat respiratory culture.  CRITICAL CARE Performed by: Kipp Brood   Total critical care time: 35 minutes  Critical care time was exclusive of separately billable procedures and treating other patients.  Critical care was necessary to treat or prevent imminent or life-threatening deterioration.  Critical care was time spent personally by me on the following activities: development of treatment plan with patient and/or surrogate as well as nursing, discussions with consultants, evaluation of patient's response to treatment, examination of patient, obtaining history from patient or surrogate, ordering and performing treatments and interventions, ordering and review of laboratory studies, ordering and review of radiographic studies, pulse oximetry, re-evaluation of patient's condition and participation in multidisciplinary rounds.  Kipp Brood, MD [facility] ICU Physician [place]  Pager: [phone] Mobile: [phone] After hours: [phone].

## 2018-04-06 NOTE — Progress Notes (Signed)
Pt ET tube out  To 18   Advanced tube back to 23 at lips.

## 2018-04-07 ENCOUNTER — Inpatient Hospital Stay (HOSPITAL_COMMUNITY): Payer: Medicare Other

## 2018-04-07 LAB — CBC
HCT: 20.2 % — ABNORMAL LOW (ref 36.0–46.0)
HEMATOCRIT: 26.1 % — AB (ref 36.0–46.0)
HEMOGLOBIN: 8.3 g/dL — AB (ref 12.0–15.0)
Hemoglobin: 6.4 g/dL — CL (ref 12.0–15.0)
MCH: 26.6 pg (ref 26.0–34.0)
MCH: 27.1 pg (ref 26.0–34.0)
MCHC: 31.7 g/dL (ref 30.0–36.0)
MCHC: 31.8 g/dL (ref 30.0–36.0)
MCV: 83.8 fL (ref 80.0–100.0)
MCV: 85.3 fL (ref 80.0–100.0)
NRBC: 0.2 % (ref 0.0–0.2)
PLATELETS: 124 10*3/uL — AB (ref 150–400)
Platelets: 122 10*3/uL — ABNORMAL LOW (ref 150–400)
RBC: 2.41 MIL/uL — AB (ref 3.87–5.11)
RBC: 3.06 MIL/uL — ABNORMAL LOW (ref 3.87–5.11)
RDW: 17.8 % — ABNORMAL HIGH (ref 11.5–15.5)
RDW: 18 % — AB (ref 11.5–15.5)
WBC: 11.5 10*3/uL — ABNORMAL HIGH (ref 4.0–10.5)
WBC: 13.9 10*3/uL — AB (ref 4.0–10.5)
nRBC: 0.2 % (ref 0.0–0.2)

## 2018-04-07 LAB — GLUCOSE, CAPILLARY
GLUCOSE-CAPILLARY: 149 mg/dL — AB (ref 70–99)
GLUCOSE-CAPILLARY: 168 mg/dL — AB (ref 70–99)
GLUCOSE-CAPILLARY: 157 mg/dL — AB (ref 70–99)
GLUCOSE-CAPILLARY: 145 mg/dL — AB (ref 70–99)
Glucose-Capillary: 118 mg/dL — ABNORMAL HIGH (ref 70–99)

## 2018-04-07 LAB — BASIC METABOLIC PANEL
Anion gap: 13 (ref 5–15)
BUN: 166 mg/dL — AB (ref 8–23)
CHLORIDE: 101 mmol/L (ref 98–111)
CO2: 35 mmol/L — ABNORMAL HIGH (ref 22–32)
Calcium: 8.8 mg/dL — ABNORMAL LOW (ref 8.9–10.3)
Creatinine, Ser: 2.32 mg/dL — ABNORMAL HIGH (ref 0.44–1.00)
GFR calc Af Amer: 22 mL/min — ABNORMAL LOW (ref >60)
GFR, EST NON AFRICAN AMERICAN: 19 mL/min — AB (ref >60)
GLUCOSE: 117 mg/dL — AB (ref 70–99)
POTASSIUM: 3.9 mmol/L (ref 3.5–5.1)
Sodium: 149 mmol/L — ABNORMAL HIGH (ref 135–145)

## 2018-04-07 LAB — PREPARE RBC (CROSSMATCH)

## 2018-04-07 MED ORDER — FUROSEMIDE 10 MG/ML IJ SOLN
100.0000 mg | Freq: Once | INTRAVENOUS | Status: AC
  Administered 2018-04-07: 100 mg via INTRAVENOUS
  Filled 2018-04-07: qty 10

## 2018-04-07 MED ORDER — ACETAZOLAMIDE SODIUM 500 MG IJ SOLR
500.0000 mg | Freq: Once | INTRAMUSCULAR | Status: AC
  Administered 2018-04-07: 500 mg via INTRAVENOUS
  Filled 2018-04-07: qty 500

## 2018-04-07 MED ORDER — SODIUM CHLORIDE 0.9% IV SOLUTION: Freq: Once | INTRAVENOUS | Status: DC

## 2018-04-07 NOTE — Progress Notes (Signed)
Follow up - Critical Care Medicine Note  Patient Details:    Heather Mckay is an 77 y.o. female admitted for respiratory failure due to metapneumovirus pneumonia on a background of pulmonary sarcoidosis.  She is finally starting to improve after 14 days in hospital trial of diuresis.   Subjective:    Overnight Issues:   Continued to wean support. More consistently following commands.   Objective:   Physical Exam:   General: Hypertensive, not requiring vasopressors.  In normal sinus rhythm.  Afebrile. HEENT: Normal sclera.  ETT/OGT in place with no pressure ulceration. Respiratory: On PRVC ventilation with no asynchrony.  Appears more comfortable on ventilator today. Bilateral rhonchi. Unable to tolerate PSV 20 (drop in tidal volume)Cardiovascular: Sinus rhythm.  Warm extremities.  First and second heart sounds unremarkable. Abdomen: Soft and nontender, tolerating tube feeds. Neurological: Remains stuporous.  Follows commands on Precedex and Dilaudid.  Skin: line sites intact Extemities: Mild edema   Ancillary tests:  CBC: decreasing leukocytosis at 13.9, anemia is stable at 8.3, stable thrombocytopenia Chemistry: Improving creatinine, metabolic alkalosis possibly respiratory compensation. QTc: 529  Assessment/Plan:   Remains critically ill due to severe respiratory failure wearing mechanical ventilation. Severe hypoxia with high FiO2 and PEEP requirements which are finally improving. Metapneumovirus pneumonia, treatment if supportive. Limited, but some response to diuresis. Encephalopathy secondary to sedation and possible delirium - improving. Atrial fibrillation with rapid ventricular response, now in sinus on amiodarone.  Plan:  Daily SBT, but lung mechanics today suggest that she is far from ready to extubate.  Plan for tracheostomy this week to facilitate weaning of ventilation and sedation. Wean sedation as tolerated Hold further active diuresis for now, but maintain  euvolemia. Continue Bronchodilators. Start weaning current sedation regimen as oxygenation improves. Continue oral amiodarone.  Repeat EKG in am  CRITICAL CARE Performed by: Kipp Brood   Total critical care time: 35 minutes  Critical care time was exclusive of separately billable procedures and treating other patients.  Critical care was necessary to treat or prevent imminent or life-threatening deterioration.  Critical care was time spent personally by me on the following activities: development of treatment plan with patient and/or surrogate as well as nursing, discussions with consultants, evaluation of patient's response to treatment, examination of patient, obtaining history from patient or surrogate, ordering and performing treatments and interventions, ordering and review of laboratory studies, ordering and review of radiographic studies, pulse oximetry, re-evaluation of patient's condition and participation in multidisciplinary rounds.  Kipp Brood, MD [facility] ICU Physician [place]  Pager: [phone] Mobile: [phone] After hours: [phone].

## 2018-04-07 NOTE — Progress Notes (Signed)
Sputum sample obtained and sent to lab by RT. 

## 2018-04-07 NOTE — Progress Notes (Signed)
CRITICAL VALUE ALERT  Critical Value:  Hemoglobin 6.4  Date & Time Notied:  04/07/2018  0550  Provider Notified: Warren Lacy  Orders Received/Actions taken: Awaiting Orders

## 2018-04-07 NOTE — Progress Notes (Signed)
[place] Progress Note Patient Name: Heather Mckay DOB: [birth year] MRN: [id number]   Date of Service  04/07/2018  HPI/Events of Note  Hgb drop from 7.5 to 6.4  eICU Interventions  Transfuse 1 unit pRBC Post-transfusion CBC     Intervention Category Major Interventions: Other:  DETERDING,ELIZABETH 04/07/2018, 5:55 AM

## 2018-04-08 LAB — BLOOD GAS, ARTERIAL
Acid-Base Excess: 11.2 mmol/L — ABNORMAL HIGH (ref 0.0–2.0)
Bicarbonate: 35.5 mmol/L — ABNORMAL HIGH (ref 20.0–28.0)
Drawn by: 362771
FIO2: 30
O2 Saturation: 88.5 %
PCO2 ART: 50.5 mmHg — AB (ref 32.0–48.0)
PEEP: 5 cmH2O
Patient temperature: 99.3
RATE: 15 resp/min
VT: 430 mL
pH, Arterial: 7.463 — ABNORMAL HIGH (ref 7.350–7.450)
pO2, Arterial: 57.3 mmHg — ABNORMAL LOW (ref 83.0–108.0)

## 2018-04-08 LAB — GLUCOSE, CAPILLARY
GLUCOSE-CAPILLARY: 123 mg/dL — AB (ref 70–99)
GLUCOSE-CAPILLARY: 148 mg/dL — AB (ref 70–99)
GLUCOSE-CAPILLARY: 156 mg/dL — AB (ref 70–99)
GLUCOSE-CAPILLARY: 179 mg/dL — AB (ref 70–99)
GLUCOSE-CAPILLARY: 157 mg/dL — AB (ref 70–99)
Glucose-Capillary: 120 mg/dL — ABNORMAL HIGH (ref 70–99)
Glucose-Capillary: 155 mg/dL — ABNORMAL HIGH (ref 70–99)

## 2018-04-08 LAB — CBC
HCT: 25.1 % — ABNORMAL LOW (ref 36.0–46.0)
Hemoglobin: 7.7 g/dL — ABNORMAL LOW (ref 12.0–15.0)
MCH: 26.5 pg (ref 26.0–34.0)
MCHC: 30.7 g/dL (ref 30.0–36.0)
MCV: 86.3 fL (ref 80.0–100.0)
Platelets: 120 10*3/uL — ABNORMAL LOW (ref 150–400)
RBC: 2.91 MIL/uL — ABNORMAL LOW (ref 3.87–5.11)
RDW: 17.7 % — ABNORMAL HIGH (ref 11.5–15.5)
WBC: 13.3 10*3/uL — ABNORMAL HIGH (ref 4.0–10.5)
nRBC: 0.1 % (ref 0.0–0.2)

## 2018-04-08 LAB — BASIC METABOLIC PANEL
Anion gap: 10 (ref 5–15)
BUN: 160 mg/dL — AB (ref 8–23)
CALCIUM: 8.7 mg/dL — AB (ref 8.9–10.3)
CO2: 34 mmol/L — ABNORMAL HIGH (ref 22–32)
CREATININE: 2.31 mg/dL — AB (ref 0.44–1.00)
Chloride: 105 mmol/L (ref 98–111)
GFR calc Af Amer: 22 mL/min — ABNORMAL LOW (ref >60)
GFR calc non Af Amer: 19 mL/min — ABNORMAL LOW (ref >60)
Glucose, Bld: 141 mg/dL — ABNORMAL HIGH (ref 70–99)
Potassium: 2.9 mmol/L — ABNORMAL LOW (ref 3.5–5.1)
SODIUM: 149 mmol/L — AB (ref 135–145)

## 2018-04-08 LAB — POCT I-STAT 3, ART BLOOD GAS (G3+)
Acid-Base Excess: 9 mmol/L — ABNORMAL HIGH (ref 0.0–2.0)
Bicarbonate: 33.6 mmol/L — ABNORMAL HIGH (ref 20.0–28.0)
O2 SAT: 96 %
PCO2 ART: 48.9 mmHg — AB (ref 32.0–48.0)
PO2 ART: 77 mmHg — AB (ref 83.0–108.0)
TCO2: 35 mmol/L — ABNORMAL HIGH (ref 22–32)
pH, Arterial: 7.444 (ref 7.350–7.450)

## 2018-04-08 MED ORDER — MIDAZOLAM HCL 2 MG/2ML IJ SOLN
1.0000 mg | INTRAMUSCULAR | Status: DC | PRN
  Administered 2018-04-08 – 2018-04-16 (×14): 1 mg via INTRAVENOUS
  Filled 2018-04-08 (×15): qty 2

## 2018-04-08 MED ORDER — FENTANYL BOLUS VIA INFUSION
25.0000 ug | INTRAVENOUS | Status: DC | PRN
  Administered 2018-04-08 – 2018-04-16 (×22): 25 ug via INTRAVENOUS
  Filled 2018-04-08: qty 25

## 2018-04-08 MED ORDER — FAMOTIDINE 40 MG/5ML PO SUSR
20.0000 mg | Freq: Every day | ORAL | Status: DC
  Administered 2018-04-09 – 2018-04-17 (×9): 20 mg
  Filled 2018-04-08 (×9): qty 2.5

## 2018-04-08 MED ORDER — FENTANYL CITRATE (PF) 100 MCG/2ML IJ SOLN
50.0000 ug | Freq: Once | INTRAMUSCULAR | Status: AC
  Administered 2018-04-08: 50 ug via INTRAVENOUS
  Filled 2018-04-08: qty 2

## 2018-04-08 MED ORDER — POTASSIUM CHLORIDE 20 MEQ/15ML (10%) PO SOLN
40.0000 meq | Freq: Once | ORAL | Status: AC
  Administered 2018-04-08: 40 meq
  Filled 2018-04-08: qty 30

## 2018-04-08 MED ORDER — FENTANYL 2500MCG IN NS 250ML (10MCG/ML) PREMIX INFUSION
25.0000 ug/h | INTRAVENOUS | Status: DC
  Administered 2018-04-08: 50 ug/h via INTRAVENOUS
  Administered 2018-04-09: 250 ug/h via INTRAVENOUS
  Administered 2018-04-09: 300 ug/h via INTRAVENOUS
  Administered 2018-04-09: 350 ug/h via INTRAVENOUS
  Administered 2018-04-09 – 2018-04-10 (×3): 250 ug/h via INTRAVENOUS
  Administered 2018-04-11: 75 ug/h via INTRAVENOUS
  Administered 2018-04-11: 250 ug/h via INTRAVENOUS
  Administered 2018-04-12: 200 ug/h via INTRAVENOUS
  Administered 2018-04-13: 250 ug/h via INTRAVENOUS
  Administered 2018-04-13 – 2018-04-14 (×2): 25 ug/h via INTRAVENOUS
  Administered 2018-04-15 (×2): 150 ug/h via INTRAVENOUS
  Administered 2018-04-16: 250 ug/h via INTRAVENOUS
  Administered 2018-04-16: 150 ug/h via INTRAVENOUS
  Administered 2018-04-17: 200 ug/h via INTRAVENOUS
  Filled 2018-04-08 (×18): qty 250

## 2018-04-08 MED ORDER — HEPARIN (PORCINE) IN NACL 100-0.45 UNIT/ML-% IJ SOLN
900.0000 [IU]/h | INTRAMUSCULAR | Status: DC
  Administered 2018-04-08 (×2): 900 [IU]/h via INTRAVENOUS
  Filled 2018-04-08: qty 250

## 2018-04-08 MED ORDER — CLONAZEPAM 0.5 MG PO TBDP
0.5000 mg | ORAL_TABLET | Freq: Two times a day (BID) | ORAL | Status: DC
  Administered 2018-04-08 – 2018-04-16 (×18): 0.5 mg
  Filled 2018-04-08 (×18): qty 1

## 2018-04-08 MED ORDER — FREE WATER
300.0000 mL | Status: DC
  Administered 2018-04-08 – 2018-04-10 (×12): 300 mL

## 2018-04-08 NOTE — Progress Notes (Addendum)
NAME:  Heather Mckay, MRN:  [id number], DOB:  05/13/1941, LOS: 64 ADMISSION DATE:  03/28/2018, CONSULTATION DATE:  10/21 REFERRING MD:  Sloan Leiter, CHIEF COMPLAINT:  Dyspnea   Brief History   77 y/o female with CHF, pulmonary sarcoidosis was admitted on 10/20 to the hospitalist service with dyspnea, myalgias, cough noted to have a positive RVP for metapneumovirus. Concern for LLL CAP.  Intubated with septic shock 10/22.     Past Medical History  Sarcoidosis, Pulmonary hypertension, CHF, HTN, HLD, GERD, Asthma, infasive ductal carcinoma, MGUS, OSA on CPAP and on home [facility] Events   10/20 Admit 10/21 PCCM consulted 10/22 Early AM intubated, in shock 10/25 Hgb 6.5, 1u pRBC ordered 11/04 Seroquel stopped for prolonged QTc  Consults:  10/21 PCCM   Procedures (surgical and bedside):  10/22 ETT  Significant Diagnostic Tests:  12/2016 TTE > RVSP 69 mmHb, LVEF 60-65%, mod LVH, normal left atrium 10/22 TTE >> 55-60%, no wall motion abnormalities, tricuspid valve thickening c/w rheumatic disease, moderate regurgitation, PA peak pressure 64 UE Duplex 10/22 >> positive for deep vein thrombosis of right brachial veins  Micro Data:  RVP 10/20 >> positive for metapneumovirus  BCx2 10/20 >> negative  Sputum 10/20 >> MRSA pos  Antimicrobials:  Ceftriaxone 10/20 >>10/24 Azithro 10/20 >> 10/24 Zosyn/vanc x1 dose 10/21  Subjective:  RN reports hypokalemia.  Received 1 unit PRBC 11/3.  No acute events overnight.   Objective   Blood pressure 113/64, pulse 100, temperature 98.2 F (36.8 C), temperature source Axillary, resp. rate (!) 25, height 5\' 2"  (1.575 m), weight 72.3 kg, SpO2 93 %.    Vent Mode: PRVC FiO2 (%):  [40 %-50 %] 50 % Set Rate:  [15 bmp] 15 bmp Vt Set:  [430 mL] 430 mL PEEP:  [5 cmH20] 5 cmH20 Plateau Pressure:  [24 cmH20-28 cmH20] 25 cmH20   Intake/Output Summary (Last 24 hours) at 04/08/2018 1403 Last data filed at 04/08/2018 1400 Gross per 24 hour    Intake 1912.95 ml  Output 2200 ml  Net -287.05 ml   Filed Weights   04/06/18 0455 04/07/18 0500 04/08/18 0500  Weight: 71.7 kg 71.8 kg 72.3 kg    Examination:  General: elderly female lying in bed on vent HEENT: MM pink/moist, ETT, sclera edema  Neuro: opens eyes to voice, nods no to pain  CV: s1s2 rrr, no m/r/g PULM: even/non-labored, lungs bilaterally coarse  EQ:ASTM, non-tender, bsx4 active  Extremities: warm/dry, trace BLE edema  Skin: no rashes or lesions  Assessment & Plan:   Acute on Chronic Respiratory Failure with Hypoxemia - in setting of metapneumovirus, CAP and baseline pulmonary hypertension, sarcoidosis  P: PRVC 8 cc/kg   Wean PEEP / FiO2 for sats > 90% Follow intermittent CXR  VAP prevention measures  Minimize sedating medications to allow for weaning   Community Acquired PNA secondary to Metapneumovirus  P: Monitor off abx  Trend CXR  Agitated Delirium  P: Stop seroquel with prolonged QTc Add klonopin 0.5 mg PT BID  PRN versed  Stop Precedex as been on for prolonged therapy Stop dilaudid gtt, transition to fentanyl   AKI P: Trend BMP / urinary output Replace electrolytes as indicated Avoid nephrotoxic agents, ensure adequate renal perfusion Hold allopurinol with AKI   Electrolyte Disturbances  -hypernatremia, hyperchloremia P: Increase free water to 300 ml PT Q4 Follow serum Na   Transaminitis - Possibly congestive hepatopathy vs shock liver from prior hypotension P: Follow LFT's Ensure adequate perfusion   Anemia -  suspect related to acute illness + dilution, no evidence of bleeding  P: Trend CBC  Monitor for bleeding  Transfuse per ICU guidelines   Atrial Fibrillation  -amiodarone, eliquis pre-admit P: SQ Heparin Q8 Continue PO amiodarone    Brachial Vein Segment Thrombosis  -home eliquis on hold due to AKI P: Resume IV heparin  Discontinue R midline catheter   Hyperglycemia - well controlled  P: Lantus 14 units  QD SSI, moderate scale   Pulmonary Sarcoidosis  P: Supportive care, no acute interventions  OSA  P: Will need CPAP post extubation   Gout P: Hold home allopurinol with AKI   Disposition / Summary of Today's Plan 04/08/18   As above    Diet: TF Pain: Anxiety/Delirium protocol (if indicated): RASS goal 0 to -1 VAP protocol (if indicated): yes DVT prophylaxis: SQ heparin.  GI prophylaxis: famotidine Hyperglycemia protocol: SSI Mobility: bed rest Code Status: FC Family Communication: No family at bedside 11/4  Ancillary tests   Recent Labs  Lab 04/07/18 0455 04/07/18 1320 04/08/18 0439  HGB 6.4* 8.3* 7.7*  HCT 20.2* 26.1* 25.1*  WBC 11.5* 13.9* 13.3*  PLT 124* 122* 120*   Recent Labs  Lab 04/03/18 0552 04/04/18 0400 04/06/18 0433 04/07/18 0455 04/08/18 0439  NA 151* 152* 148* 149* 149*  K 4.3 4.2 3.5 3.9 2.9*  CL 109 112* 105 101 105  CO2 30 33* 33* 35* 34*  GLUCOSE 189* 135* 149* 117* 141*  BUN 145* 150* 160* 166* 160*  CREATININE 2.63* 2.73* 2.43* 2.32* 2.31*  CALCIUM 9.1 9.1 9.2 8.8* 8.7*   PCXR 11/3 R>L airspace disease, consolidated LLL with air bronchograms  Critical care time:  30 minutes     Noe Gens, NP-C [facility] Pgr: [phone] or if no answer [phone] 04/08/2018, 2:03 PM

## 2018-04-08 NOTE — Progress Notes (Signed)
ANTICOAGULATION CONSULT NOTE - Initial Consult  Pharmacy Consult for heparin Indication: atrial fibrillation / ? RUE DVT  Allergies  Allergen Reactions  . Hydroxychloroquine Swelling    Patient reported increased swelling of her thyroid gland area. But patient has thyromegaly. Unclear if it is really from Plaquenil or not.  . Hydrocodone     Itching  . Imdur [Isosorbide Dinitrate]     Itching  . Percocet [Oxycodone-Acetaminophen]     Itching   Patient Measurements: Height: 5\' 2"  (157.5 cm) Weight: 159 lb 6.3 oz (72.3 kg) IBW/kg (Calculated) : 50.1 Heparin Dosing Weight: 67 kg  Vital Signs: Temp: 98.2 F (36.8 C) (11/04 1100) Temp Source: Axillary (11/04 1100) BP: 113/64 (11/04 1400) Pulse Rate: 100 (11/04 1400)  Labs: Recent Labs    04/06/18 0433 04/07/18 0455 04/07/18 1320 04/08/18 0439  HGB 7.5* 6.4* 8.3* 7.7*  HCT 24.6* 20.2* 26.1* 25.1*  PLT 145* 124* 122* 120*  APTT 47*  --   --   --   CREATININE 2.43* 2.32*  --  2.31*    Estimated Creatinine Clearance: 19.3 mL/min (A) (by C-G formula based on SCr of 2.31 mg/dL (H)).  Medical History: Past Medical History:  Diagnosis Date  . Asthma   . CHF (congestive heart failure) ([place])   . GERD (gastroesophageal reflux disease)   . HLD (hyperlipidemia)   . Hypertension    Assessment: 77 yo F presents with worsening SOB / multifocal PNA. On Eliquis PTA for Afib with CHADsVASC of 7. Also with possible brachial vein segment thrombosis. Now to transition to IV heparin. Has not had Eliquis since 10/16. Last [place] heparin was at 1331 today. Hgb low at 7.7, plts low at 120. Received transfusion on 11/3.  Goal of Therapy:  Heparin level 0.3-0.7 units/ml Monitor platelets by anticoagulation protocol: Yes   Plan:  No heparin bolus with recent [place] heparin Start heparin gtt at 900 units/hr Check heparin level in 8 hrs Monitor daily heparin level, CBC, s/s of bleed  [place] Bashore J 04/08/2018,2:38 PM

## 2018-04-08 NOTE — Progress Notes (Signed)
On arrival noted saturation in the 70's and low 80's. BBS to auscultation reveals Rhonchi throughout. Patient was manually ventilated bagged lavage with a 6cc NS flush. Patient was inline suctioned after a few minutes of manual ventilation. Got back moderate amount of thick pink tinged/bloody mucous plugs and secretions. Pt oxygenation support was increased/adjusted due to patient no being stable and unable to maintain adequate oxygenation levels. Pt is now requiring 80% or > of FiO2 support and now is on 8PEEP. Patient is tolerating it well. Blood pressure and hemodynamics are stable at this time. RN aware of the bloody mucous plugs. Noted in patient chart that her H&H is low.

## 2018-04-08 NOTE — Progress Notes (Signed)
Patient's sedation being changed and patient to agitated at this time for CPT.

## 2018-04-09 ENCOUNTER — Inpatient Hospital Stay (HOSPITAL_COMMUNITY): Payer: Medicare Other

## 2018-04-09 LAB — BASIC METABOLIC PANEL
ANION GAP: 8 (ref 5–15)
ANION GAP: 6 (ref 5–15)
BUN: 169 mg/dL — ABNORMAL HIGH (ref 8–23)
BUN: 131 mg/dL — ABNORMAL HIGH (ref 8–23)
CALCIUM: 8.3 mg/dL — AB (ref 8.9–10.3)
CALCIUM: 6.1 mg/dL — AB (ref 8.9–10.3)
CO2: 33 mmol/L — ABNORMAL HIGH (ref 22–32)
CO2: 25 mmol/L (ref 22–32)
Chloride: 107 mmol/L (ref 98–111)
Chloride: 116 mmol/L — ABNORMAL HIGH (ref 98–111)
Creatinine, Ser: 2.24 mg/dL — ABNORMAL HIGH (ref 0.44–1.00)
Creatinine, Ser: 1.67 mg/dL — ABNORMAL HIGH (ref 0.44–1.00)
GFR calc Af Amer: 23 mL/min — ABNORMAL LOW (ref >60)
GFR calc Af Amer: 33 mL/min — ABNORMAL LOW (ref >60)
GFR, EST NON AFRICAN AMERICAN: 20 mL/min — AB (ref >60)
GFR, EST NON AFRICAN AMERICAN: 29 mL/min — AB (ref >60)
GLUCOSE: 143 mg/dL — AB (ref 70–99)
GLUCOSE: 116 mg/dL — AB (ref 70–99)
Potassium: 6.2 mmol/L — ABNORMAL HIGH (ref 3.5–5.1)
Potassium: 5.8 mmol/L — ABNORMAL HIGH (ref 3.5–5.1)
SODIUM: 148 mmol/L — AB (ref 135–145)
SODIUM: 147 mmol/L — AB (ref 135–145)

## 2018-04-09 LAB — CULTURE, RESPIRATORY W GRAM STAIN

## 2018-04-09 LAB — CULTURE, RESPIRATORY: CULTURE: NORMAL

## 2018-04-09 LAB — GLUCOSE, CAPILLARY
GLUCOSE-CAPILLARY: 139 mg/dL — AB (ref 70–99)
GLUCOSE-CAPILLARY: 149 mg/dL — AB (ref 70–99)
GLUCOSE-CAPILLARY: 109 mg/dL — AB (ref 70–99)
Glucose-Capillary: 128 mg/dL — ABNORMAL HIGH (ref 70–99)
Glucose-Capillary: 127 mg/dL — ABNORMAL HIGH (ref 70–99)
Glucose-Capillary: 100 mg/dL — ABNORMAL HIGH (ref 70–99)

## 2018-04-09 LAB — CBC
HCT: 25.9 % — ABNORMAL LOW (ref 36.0–46.0)
Hemoglobin: 7.8 g/dL — ABNORMAL LOW (ref 12.0–15.0)
MCH: 26.3 pg (ref 26.0–34.0)
MCHC: 30.1 g/dL (ref 30.0–36.0)
MCV: 87.2 fL (ref 80.0–100.0)
PLATELETS: 145 10*3/uL — AB (ref 150–400)
RBC: 2.97 MIL/uL — AB (ref 3.87–5.11)
RDW: 18.6 % — AB (ref 11.5–15.5)
WBC: 17.3 10*3/uL — ABNORMAL HIGH (ref 4.0–10.5)
nRBC: 0.2 % (ref 0.0–0.2)

## 2018-04-09 MED ORDER — ETOMIDATE 2 MG/ML IV SOLN
INTRAVENOUS | Status: AC
  Administered 2018-04-09: 20 mg
  Filled 2018-04-09: qty 10

## 2018-04-09 MED ORDER — MIDAZOLAM HCL 2 MG/2ML IJ SOLN
INTRAMUSCULAR | Status: AC
  Administered 2018-04-09: 2 mg
  Filled 2018-04-09: qty 2

## 2018-04-09 MED ORDER — OXYCODONE HCL 5 MG PO TABS
5.0000 mg | ORAL_TABLET | Freq: Three times a day (TID) | ORAL | Status: DC
  Administered 2018-04-09 – 2018-04-17 (×24): 5 mg via ORAL
  Filled 2018-04-09 (×25): qty 1

## 2018-04-09 NOTE — Progress Notes (Signed)
CPT held patient is resting comfortably at this time.

## 2018-04-09 NOTE — Progress Notes (Signed)
CRITICAL VALUE ALERT  Critical Value:  Calcium 6.1  Date & Time Notied:  11/5 1325  Provider Notified: Noe Gens, NP  Orders Received/Actions taken: To be determine

## 2018-04-09 NOTE — Procedures (Signed)
Bronchoscopy Procedure Note Heather Mckay [id number] 06-09-1940  Procedure: Bronchoscopy Indications: Diagnostic evaluation of the airways  Procedure Details Consent: Risks of procedure as well as the alternatives and risks of each were explained to the (patient/caregiver).  Consent for procedure obtained. Time Out: Verified patient identification, verified procedure, site/side was marked, verified correct patient position, special equipment/implants available, medications/allergies/relevent history reviewed, required imaging and test results available.  Performed  In preparation for procedure, patient was given 100% FiO2 and bronchoscope lubricated. Sedation: Benzodiazepines and Muscle relaxants  Airway entered and the following bronchi were examined: RUL, RML, RLL, LUL, LLL and Bronchi. Mild amount of fresh blood noted in the right middle and lower lobes which was suctioned out. No active bleeding noted     Procedures performed: BAL in RML. 30 cc X 4 were instilled and about 80cc bloody fluid suctioned out  Evaluation Hemodynamic Status: BP stable throughout; O2 sats: stable throughout Patient's Current Condition: stable Complications: No apparent complications Patient did tolerate procedure well.  Marshell Garfinkel MD [facility] 04/09/2018, 2:48 PM

## 2018-04-09 NOTE — Progress Notes (Signed)
NAME:  Heather Mckay, MRN:  [id number], DOB:  03/05/1941, LOS: 21 ADMISSION DATE:  04/03/2018, CONSULTATION DATE:  10/21 REFERRING MD:  Sloan Leiter, CHIEF COMPLAINT:  Dyspnea   Brief History   77 y/o female with CHF, pulmonary sarcoidosis was admitted on 10/20 to the hospitalist service with dyspnea, myalgias, cough noted to have a positive RVP for metapneumovirus. Concern for LLL CAP.  Intubated with septic shock 10/22.     Past Medical History  Sarcoidosis, Pulmonary hypertension, CHF, HTN, HLD, GERD, Asthma, infasive ductal carcinoma, MGUS, OSA on CPAP and on home [facility] Events   10/20 Admit 10/21 PCCM consulted 10/22 Early AM intubated, in shock 10/25 Hgb 6.5, 1u pRBC ordered 11/04 Seroquel stopped for prolonged QTc  Consults:  10/21 PCCM   Procedures (surgical and bedside):  10/22 ETT  Significant Diagnostic Tests:  12/2016 TTE > RVSP 69 mmHb, LVEF 60-65%, mod LVH, normal left atrium 10/22 TTE >> 55-60%, no wall motion abnormalities, tricuspid valve thickening c/w rheumatic disease, moderate regurgitation, PA peak pressure 64 UE Duplex 10/22 >> positive for deep vein thrombosis of right brachial veins  Micro Data:  RVP 10/20 >> positive for metapneumovirus  BCx2 10/20 >> negative  Sputum 10/20 >> MRSA pos Tracheal aspirate 11/5 >>   Antimicrobials:  Ceftriaxone 10/20 >>10/24 Azithro 10/20 >> 10/24 Zosyn/vanc x1 dose 10/21  Subjective:  BMP specimen hemolyzed.  RN reports hemoptysis early am.  FiO2 needs increased to 90% overnight / PEEP to 12.  Fever 100.1, WBC 17.3.    Objective   Blood pressure (!) 132/55, pulse (!) 111, temperature 100.2 F (37.9 C), temperature source Core, resp. rate (!) 25, height 5\' 2"  (1.575 m), weight 72.4 kg, SpO2 91 %. CVP:  [11 mmHg-15 mmHg] 11 mmHg  Vent Mode: PRVC FiO2 (%):  [50 %-90 %] 90 % Set Rate:  [15 bmp] 15 bmp Vt Set:  [430 mL] 430 mL PEEP:  [5 cmH20-8 cmH20] 8 cmH20 Plateau Pressure:  [24 cmH20-32 cmH20] 24  cmH20   Intake/Output Summary (Last 24 hours) at 04/09/2018 1137 Last data filed at 04/09/2018 1118 Gross per 24 hour  Intake 1978.28 ml  Output 900 ml  Net 1078.28 ml   Filed Weights   04/07/18 0500 04/08/18 0500 04/09/18 0500  Weight: 71.8 kg 72.3 kg 72.4 kg    Examination:  General: elderly female lying in bed in NAD on vent HEENT: MM pink/moist, ETT, scleral edema  Neuro: Sedate, agitation with stimulation  CV: s1s2 rrr, no m/r/g PULM: even/non-labored, lungs bilaterally coarse, occasional rhonchi  MP:NTIR, non-tender, bsx4 active  Extremities: warm/dry, BUE 1+ edema, LE without edema  Skin: no rashes or lesions  Assessment & Plan:   Acute on Chronic Respiratory Failure with Hypoxemia - in setting of metapneumovirus, CAP and baseline pulmonary hypertension, sarcoidosis  P: PRVC 8 cc/kg  Wean PEEP / FiO2 for sats >90% May need tracheostomy placement but  Minimize sedation to allow for SBT / WUA   Follow intermittent CXR  Community Acquired PNA secondary to [place], ARDS -bilateral infiltrates on CXR, unchanged with diuresis/abx.  Consider amiodarone lung, sarcoid, hemoptysis  P: Treated, follow CXR / fever curve  May need CT chest to better evaluate infiltrates May need FOB for airway inspection once PEEP/FiO2 needs improved Consider steroids > will discuss differential with MD Repeat tracheal aspirate  Agitated Delirium  P: Continue klonopin PT  PRN versed  Continue fentanyl gtt  AKI P: Trend BMP / urinary output Replace electrolytes as indicated  Avoid nephrotoxic agents, ensure adequate renal perfusion  Electrolyte Disturbances  -hypernatremia, hyperchloremia P: Free water 300 ml PT Q4 Follow Na   Transaminitis - Possibly congestive hepatopathy vs shock liver from prior hypotension P: Trend LFT's   Anemia - suspect related to acute illness + dilution, no evidence of bleeding  P: Follow CBC  Transfuse per ICU guidelines  Atrial  Fibrillation  -amiodarone, eliquis pre-admit P: Hold heparin  SCD's  PT amiodarone Follow up QTc in am   Brachial Vein Segment Thrombosis  -home eliquis on hold due to AKI P: Heparin on hold with bloody ETT secretions  Hyperglycemia - well controlled  P: Continue lantus 14 units QD SSI, moderate scale   Pulmonary Sarcoidosis  P: Supportive care  OSA  P: CPAP post extubation    Gout P: Hold home allopurinol with AKI   Disposition / Summary of Today's Plan 04/09/18   Likely may need trach given prolonged vent needs.     Diet: TF Pain: Anxiety/Delirium protocol (if indicated): RASS goal 0 to -1 VAP protocol (if indicated): yes DVT prophylaxis: SCD's.  GI prophylaxis: famotidine Hyperglycemia protocol: SSI Mobility: bed rest Code Status: FC Family Communication: No family at bedside 11/5.  Will call family for update.   Ancillary tests   Recent Labs  Lab 04/07/18 1320 04/08/18 0439 04/09/18 0352  HGB 8.3* 7.7* 7.8*  HCT 26.1* 25.1* 25.9*  WBC 13.9* 13.3* 17.3*  PLT 122* 120* 145*   Recent Labs  Lab 04/04/18 0400 04/06/18 0433 04/07/18 0455 04/08/18 0439 04/09/18 0352  NA 152* 148* 149* 149* 148*  K 4.2 3.5 3.9 2.9* 6.2*  CL 112* 105 101 105 107  CO2 33* 33* 35* 34* 33*  GLUCOSE 135* 149* 117* 141* 143*  BUN 150* 160* 166* 160* 169*  CREATININE 2.73* 2.43* 2.32* 2.31* 2.24*  CALCIUM 9.1 9.2 8.8* 8.7* 8.3*   PCXR 11/5 R>L airspace disease, diffuse ASD  Critical care time:  30 minutes     Noe Gens, NP-C [facility] Pgr: [phone] or if no answer [phone] 04/09/2018, 11:37 AM

## 2018-04-10 ENCOUNTER — Inpatient Hospital Stay (HOSPITAL_COMMUNITY): Payer: Medicare Other

## 2018-04-10 ENCOUNTER — Other Ambulatory Visit: Payer: Self-pay

## 2018-04-10 LAB — C-REACTIVE PROTEIN: CRP: 28.6 mg/dL — ABNORMAL HIGH (ref <1.0)

## 2018-04-10 LAB — BRAIN NATRIURETIC PEPTIDE: B Natriuretic Peptide: 1228.9 pg/mL — ABNORMAL HIGH (ref 0.0–100.0)

## 2018-04-10 LAB — BASIC METABOLIC PANEL
Anion gap: 8 (ref 5–15)
BUN: 167 mg/dL — ABNORMAL HIGH (ref 8–23)
CALCIUM: 8.3 mg/dL — AB (ref 8.9–10.3)
CO2: 33 mmol/L — ABNORMAL HIGH (ref 22–32)
CREATININE: 2.3 mg/dL — AB (ref 0.44–1.00)
Chloride: 109 mmol/L (ref 98–111)
GFR calc non Af Amer: 19 mL/min — ABNORMAL LOW (ref >60)
GFR, EST AFRICAN AMERICAN: 23 mL/min — AB (ref >60)
Glucose, Bld: 131 mg/dL — ABNORMAL HIGH (ref 70–99)
Potassium: 3.6 mmol/L (ref 3.5–5.1)
SODIUM: 150 mmol/L — AB (ref 135–145)

## 2018-04-10 LAB — CBC
HCT: 23.2 % — ABNORMAL LOW (ref 36.0–46.0)
HEMOGLOBIN: 6.9 g/dL — AB (ref 12.0–15.0)
MCH: 27.2 pg (ref 26.0–34.0)
MCHC: 29.7 g/dL — AB (ref 30.0–36.0)
MCV: 91.3 fL (ref 80.0–100.0)
PLATELETS: 136 10*3/uL — AB (ref 150–400)
RBC: 2.54 MIL/uL — AB (ref 3.87–5.11)
RDW: 20.5 % — ABNORMAL HIGH (ref 11.5–15.5)
WBC: 17.2 10*3/uL — AB (ref 4.0–10.5)
nRBC: 0.5 % — ABNORMAL HIGH (ref 0.0–0.2)

## 2018-04-10 LAB — BODY FLUID CELL COUNT WITH DIFFERENTIAL
EOS FL: 0 %
Lymphs, Fluid: 16 %
Monocyte-Macrophage-Serous Fluid: 39 % — ABNORMAL LOW (ref 50–90)
NEUTROPHIL FLUID: 45 % — AB (ref 0–25)
Total Nucleated Cell Count, Fluid: 49 cu mm (ref 0–1000)

## 2018-04-10 LAB — GLUCOSE, CAPILLARY
GLUCOSE-CAPILLARY: 150 mg/dL — AB (ref 70–99)
GLUCOSE-CAPILLARY: 198 mg/dL — AB (ref 70–99)
GLUCOSE-CAPILLARY: 151 mg/dL — AB (ref 70–99)
Glucose-Capillary: 110 mg/dL — ABNORMAL HIGH (ref 70–99)
Glucose-Capillary: 147 mg/dL — ABNORMAL HIGH (ref 70–99)

## 2018-04-10 LAB — ACID FAST SMEAR (AFB)

## 2018-04-10 LAB — HEMOGLOBIN AND HEMATOCRIT, BLOOD
HCT: 27.2 % — ABNORMAL LOW (ref 36.0–46.0)
Hemoglobin: 8.3 g/dL — ABNORMAL LOW (ref 12.0–15.0)

## 2018-04-10 LAB — SEDIMENTATION RATE: SED RATE: 124 mm/h — AB (ref 0–22)

## 2018-04-10 LAB — PREPARE RBC (CROSSMATCH)

## 2018-04-10 LAB — ACID FAST SMEAR (AFB, MYCOBACTERIA): Acid Fast Smear: NEGATIVE

## 2018-04-10 MED ORDER — SODIUM CHLORIDE 0.9 % IV SOLN
2.0000 g | INTRAVENOUS | Status: DC
  Administered 2018-04-10 – 2018-04-12 (×3): 2 g via INTRAVENOUS
  Filled 2018-04-10 (×4): qty 2

## 2018-04-10 MED ORDER — VITAL HIGH PROTEIN PO LIQD
1000.0000 mL | ORAL | Status: DC
  Administered 2018-04-10 – 2018-04-15 (×7): 1000 mL

## 2018-04-10 MED ORDER — SODIUM CHLORIDE 0.9% IV SOLUTION
Freq: Once | INTRAVENOUS | Status: AC
  Administered 2018-04-10: 09:00:00 via INTRAVENOUS

## 2018-04-10 MED ORDER — VANCOMYCIN HCL IN DEXTROSE 1-5 GM/200ML-% IV SOLN
1000.0000 mg | INTRAVENOUS | Status: DC
  Administered 2018-04-10: 1000 mg via INTRAVENOUS
  Filled 2018-04-10 (×2): qty 200

## 2018-04-10 MED ORDER — FREE WATER
400.0000 mL | Status: DC
  Administered 2018-04-10 – 2018-04-12 (×11): 400 mL

## 2018-04-10 NOTE — Consult Note (Signed)
[place]  Referring Physician: Noe Gens, NP Critical Care Primary Care Physician: Dionicia Abler, MD Patient Location at Initial Consult: Inpatient Service: MICU Chief Complaint/Reason for Consult: tracheostomy placement  History of Presenting Illness:  History obtained from primary team, EMR Heather Mckay is a  77 y.o. female presenting with respiratory failure. Admitted on 03/14/2018  With viral pneumonia (metapneumovirus)  in setting of pulmonary sarcoidosis. Intubated since 10/22 with 7.5 ETT. +ARDS, high FiO2 and PEEP requirements (currently 12 PEEP and 90% FiO2, current sat is 91%).   Patient intubated, sedated. Currently on vancomycin and cefepime.  History of A fib- previously on eliquis, then transitioned to IV heparin. Hep gtt on hold 2/2 hemoptysis yesterday. Underwent bronchoscopy yesterday. Also during this hospital course, she has had RUE DVT, A fib with RVR, encephalopathy. Minor hemoptysis in ETT today. CT chest is pending.   Past Medical History:  Diagnosis Date  . Asthma   . CHF (congestive heart failure) ([place])   . GERD (gastroesophageal reflux disease)   . HLD (hyperlipidemia)   . Hypertension     Past Surgical History:  Procedure Laterality Date  . ABDOMINAL HYSTERECTOMY    . BREAST LUMPECTOMY Right     History reviewed. No pertinent family history.  Social History   Socioeconomic History  . Marital status: Widowed    Spouse name: Not on file  . Number of children: Not on file  . Years of education: Not on file  . Highest education level: Not on file  Occupational History  . Occupation: retired  Scientific laboratory technician  . Financial resource strain: Not on file  . Food insecurity:    Worry: Not on file    Inability: Not on file  . Transportation needs:    Medical: Not on file    Non-medical: Not on file  Tobacco Use  . Smoking status: Never Smoker  . Smokeless tobacco: Never Used  Substance and Sexual  Activity  . Alcohol use: Not Currently    Frequency: Never  . Drug use: No  . Sexual activity: Not on file  Lifestyle  . Physical activity:    Days per week: Not on file    Minutes per session: Not on file  . Stress: Not on file  Relationships  . Social connections:    Talks on phone: Not on file    Gets together: Not on file    Attends religious service: Not on file    Active member of club or organization: Not on file    Attends meetings of clubs or organizations: Not on file    Relationship status: Not on file  Other Topics Concern  . Not on file  Social History Narrative  . Not on file    No current facility-administered medications on file prior to encounter.    Current Outpatient Medications on File Prior to Encounter  Medication Sig Dispense Refill  . allopurinol (ZYLOPRIM) 100 MG tablet Take 200 mg by mouth daily.    Marland Kitchen amiodarone (PACERONE) 200 MG tablet Take 200 mg by mouth daily.    Marland Kitchen apixaban (ELIQUIS) 5 MG TABS tablet Take 5 mg by mouth 2 (two) times daily.    Marland Kitchen aspirin EC 81 MG tablet Take 81 mg by mouth daily.    Marland Kitchen atorvastatin (LIPITOR) 80 MG tablet Take 80 mg by mouth at bedtime.    Marland Kitchen CALCIUM PO Take 600 mg by mouth daily.    . diclofenac sodium (VOLTAREN) 1 %  GEL Apply 1 application topically as needed (on her hand).     . ferrous sulfate 325 (65 FE) MG tablet Take 1 tablet by mouth daily.    . furosemide (LASIX) 40 MG tablet Take 40-80 mg by mouth See admin instructions. Take 80 mg in the morning and 40 mg in the evening    . metoprolol succinate (TOPROL-XL) 50 MG 24 hr tablet Take 50 mg by mouth daily.    Marland Kitchen NIFEdipine (ADALAT CC) 90 MG 24 hr tablet Take 90 mg by mouth daily.    . pantoprazole (PROTONIX) 40 MG tablet Take 40 mg by mouth.    . potassium chloride SA (K-DUR,KLOR-CON) 20 MEQ tablet Take 20 mEq by mouth daily.    . ramelteon (ROZEREM) 8 MG tablet Take 8 mg by mouth at bedtime as needed.  2  . SANTYL ointment Apply 1 application topically as  needed.    . SYMBICORT 160-4.5 MCG/ACT inhaler Inhale 2 puffs into the lungs 2 (two) times daily.    Marland Kitchen tetrahydrozoline 0.05 % ophthalmic solution Place 2 drops into both eyes as needed.    . traMADol (ULTRAM) 50 MG tablet Take 50 mg by mouth 2 (two) times daily.  3  . traZODone (DESYREL) 50 MG tablet Take 50 mg by mouth at bedtime as needed for sleep.       Allergies  Allergen Reactions  . Hydroxychloroquine Swelling    Patient reported increased swelling of her thyroid gland area. But patient has thyromegaly. Unclear if it is really from Plaquenil or not.  . Hydrocodone     Itching  . Imdur [Isosorbide Dinitrate]     Itching  . Percocet [Oxycodone-Acetaminophen]     Itching     Review of Systems: Unable to complete 2/2 mental status   OBJECTIVE: Vital Signs: Vitals:   04/10/18 1400 04/10/18 1415  BP: (!) 121/57   Pulse: 97   Resp: (!) 22   Temp:    SpO2: 95% 94%    I&O  Intake/Output Summary (Last 24 hours) at 04/10/2018 1507 Last data filed at 04/10/2018 1400 Gross per 24 hour  Intake 2657.97 ml  Output 1525 ml  Net 1132.97 ml    Physical Exam General: Intubated, sedated  Head/Face: Normocephalic, atraumatic. No scars or lesions. No sinus tenderness.   Eyes: Globes well positioned, no proptosis Lids: No periorbital edema/ecchymosis. No lid laceration Conjunctiva: No chemosis, hemorrhage PERRL  Ears: No gross deformity.    Nose: No gross deformity or lesions. No purulent discharge. Septum midline. No turbinate hypertrophy.  Mouth/Oropharynx: Lips without any lesions.   No mucosal lesions within the oropharynx. No tonsillar enlargement, exudate, or lesions. Pharyngeal walls symmetrical. Uvula midline. Tongue midline without lesions.  Neck: Trachea midline. No masses. No thyromegaly or nodules palpated. No crepitus.  Lymphatic: No lymphadenopathy in the neck.  Respiratory: Intubated  Vent Mode: PRVC FiO2 (%):  [90 %] 90 % Set Rate:  [15 bmp] 15 bmp Vt Set:   [430 mL] 430 mL PEEP:  [12 cmH20] 12 cmH20 Plateau Pressure:  [24 cmH20-33 cmH20] 27 cmH20   Cardiovascular: Regular rate and rhythm.  Extremities: No edema or cyanosis. Warm and well-perfused.  Skin: No scars or lesions on face or neck.  Neurologic: CN II-XII intact. Moving all extremities without gross abnormality.  Other:      Labs: Lab Results  Component Value Date   WBC 17.2 (H) 04/10/2018   HGB 8.3 (L) 04/10/2018   HCT 27.2 (L) 04/10/2018  PLT 136 (L) 04/10/2018   ALT 98 (H) 04/06/2018   AST 84 (H) 04/06/2018   NA 150 (H) 04/10/2018   K 3.6 04/10/2018   CL 109 04/10/2018   CREATININE 2.30 (H) 04/10/2018   BUN 167 (H) 04/10/2018   CO2 33 (H) 04/10/2018   INR 1.89 03/28/2018     Review of Ancillary Data / Diagnostic Tests: 04/09/18- bronchoscopy "Airway entered and the following bronchi were examined: RUL, RML, RLL, LUL, LLL and Bronchi. Mild amount of fresh blood noted in the right middle and lower lobes which was suctioned out. No active bleeding noted     Procedures performed: BAL in RML. 30 cc X 4 were instilled and about 80cc bloody fluid suctioned out"  CXR personally reviewed- diffuse bilateral opacities in lungs, R>L. ETT in good position.   CT chest pending  ASSESSMENT:  77 y.o. female with respiratory failure who will require tracheostomy. Currently, oxygen requirements and pressure requirements are too high for tracheostomy. Will follow.   RECOMMENDATIONS: -Will follow until oxygen requirements are safer for tracheostomy placement    Gavin Pound, MD  [facility], [place] Office phone [phone]

## 2018-04-10 NOTE — Consult Note (Addendum)
[facility] Nurse wound consult note Reason for Consult:consulted for right heel full thickness calloused area and sacral HAPI, stage 2 and DTI. Pt also with unstageable MDRI mucosal ulceration on underside of right side of upper lip from pressure of ET tube stabilizer. On outside of upper lip reddened but not broken down yet. Marble size swollen hard knot where mucosal ulceration is.   Wound type:pressure Pressure Injury POA: No Measurement:Sacral area has 8cm x 8cm x 0.1cm stage 2 in shape of butterfly. Surrounding this area is an area of DTI with blackened non blanching skin making the entire area measure 12cm x 14cm, no depth currently as DTI is evolving. Stage 2 part of wound is red, moist, has granulation buds but also has grey areas with black granulation buds on 20% of it. No odor or drainage noted. Underside of right upper lip has full thickness mucosal ulceration estimated at 0.6cm x 0.4cm unknown depth 100% white, moist wound bed, surrounded with hardened edematous area. Right heel with 1.5cm x 1.5cm above skin level calloused area, not pressure related. Foam to be applied for protection. Wound bed: see above Drainage (amount, consistency, odor)see above Periwound:see above Dressing procedure/placement/frequency: Pt with multiple co-morbidities,  I have provided nurses with orders for cleansing the stage 2 part of the sacral injury with NS, pat dry, apply 4 x 4 hydrocolloid to keep clean and moist. Do not apply Sacral foam dressing, keep pt side to side, no supine lying. There is no dressing option for right upper lip. If we placed some type of spacer to decrease pressure from tooth that would increase pressure to outside of lip from ET stabilizer, however, according to MD note pt is hopefully going to have a tracheostomy  performed soon. Pt's nutritional level is poor, she is already on high protein tube feedings. [facility] team will follow weekly and prn. Please re-consult if assistance needed prior to our  next weekly visit.  Fara Olden, RN-C, WTA-C, [facility]

## 2018-04-10 NOTE — Progress Notes (Signed)
ENT consulted for trach placement. Await CT chest. Will also add ESR, CRP and BNP to labs.     Noe Gens, NP-C [facility] Pgr: [phone] or if no answer [phone] 04/10/2018, 2:23 PM

## 2018-04-10 NOTE — Progress Notes (Addendum)
Nutrition Follow-up  DOCUMENTATION CODES:   Not applicable  INTERVENTION:   -Increase Vital High Protein to 60 ml/hr via OGT  Tube feeding regimen provides 1440 kcal (91% of needs), 126 grams of protein, and 1204 ml of H2O.   Total free water from TF and free water flushes: 3604 ml  NUTRITION DIAGNOSIS:   Inadequate oral intake related to acute illness as evidenced by NPO status.  Ongoing  GOAL:   Patient will meet greater than or equal to 90% of their needs  Progressing  MONITOR:   TF tolerance, Labs, Weight trends, Vent status  REASON FOR ASSESSMENT:   Consult Enteral/tube feeding initiation and management  ASSESSMENT:   77 yo female admitted with acute on chronic respiratory failure requiring intubation on 10/22 with CAP, pulmonary sarcoidosis and OSA; pt also with AKI, in shock, likely cardiogenic. Additional  PMH includes CHF, pulmonary HTN, HLD, GERD, invasive ductal carcinoma  Patient remains intubated on ventilator support. MV: 10 L/min Temp (24hrs), Avg:100.2 F (37.9 C), Min:99.8 F (37.7 C), Max:100.6 F (38.1 C)  Reviewed I/O's: +1.8 L x 24 hours and +3.9 L since 03/27/18  Wt has been stable.   Per PCCM notes, pt with poor prognosis and will likely require long term vent support. Plan for family discussions regarding goals of care and potential trach.   Vital HP infusing via OGT @ 50 ml/hr. Pt also receiving 400 ml free water flush every 4 hours. Complete TF regimen providing 1200 kcals, 106 grams protein, and 3408 ml daily, meeting 76% of estimated kcal needs and 96% of estimated protein needs.   Labs reviewed: Na: 150, K WDl, BUN/Creat: 167/2.3, CBGS: 147-150 (0-15 units insulin aspart every 4 hours and 14 units insulin glargine daily)  Diet Order:   Diet Order            Diet NPO time specified  Diet effective now              EDUCATION NEEDS:   Not appropriate for education at this time  Skin:  Skin Assessment: Skin Integrity  Issues: Skin Integrity Issues:: Unstageable, DTI DTI: rt hip Stage II: sacrum Unstageable: rt heel  Last BM:  04/09/18  Height:   Ht Readings from Last 1 Encounters:  03/26/18 5\' 2"  (1.575 m)    Weight:   Wt Readings from Last 1 Encounters:  04/10/18 72.5 kg    Ideal Body Weight:  50 kg  BMI:  Body mass index is 29.23 kg/m.  Estimated Nutritional Needs:   Kcal:  1588  Protein:  110-125 grams  Fluid:  > 1.6 L    Louetta Hollingshead A. Jimmye Norman, RD, LDN, CDE Pager: [phone] After hours Pager: [phone]

## 2018-04-10 NOTE — Progress Notes (Addendum)
NAME:  Heather Mckay, MRN:  [id number], DOB:  Jul 25, 1940, LOS: 82 ADMISSION DATE:  03/10/2018, CONSULTATION DATE:  10/21 REFERRING MD:  Sloan Leiter, CHIEF COMPLAINT:  Dyspnea   Brief History   77 y/o female with CHF, pulmonary sarcoidosis was admitted on 10/20 to the hospitalist service with dyspnea, myalgias, cough noted to have a positive RVP for metapneumovirus. Concern for LLL CAP.  Intubated with septic shock 10/22.     Past Medical History  Sarcoidosis, Pulmonary hypertension, CHF, HTN, HLD, GERD, Asthma, infasive ductal carcinoma, MGUS, OSA on CPAP and on home [facility] Events   10/20 Admit 10/21 PCCM consulted 10/22 Early AM intubated, in shock 10/25 Hgb 6.5, 1u pRBC ordered 11/04 Seroquel stopped for prolonged QTc  Consults:  10/21 PCCM   Procedures (surgical and bedside):  10/22 ETT  Significant Diagnostic Tests:  12/2016 TTE > RVSP 69 mmHb, LVEF 60-65%, mod LVH, normal left atrium 10/22 TTE >> 55-60%, no wall motion abnormalities, tricuspid valve thickening c/w rheumatic disease, moderate regurgitation, PA peak pressure 64 UE Duplex 10/22 >> positive for deep vein thrombosis of right brachial veins  Micro Data:  RVP 10/20 >> positive for metapneumovirus  BCx2 10/20 >> negative  Sputum 10/20 >> MRSA pos Tracheal aspirate 11/5 >>  BAL 11/5 >>  BAL AFB 11/5 >>  BAL Fungal 11/5 >>   Antimicrobials:  Ceftriaxone 10/20 >>10/24 Azithro 10/20 >> 10/24 Zosyn/vanc x1 dose 10/21 Vanco (empiric) 11/6 >>  Cefepime (empiric) 11/6 >>   Subjective:  RN reports pt remains febrile intermittently. On cooling blanket.  Remains on 200 mcg's fentanyl. Transfused 1 unit PRBC for Hgb 6.9  Objective   Blood pressure (!) 120/57, pulse 97, temperature 100 F (37.8 C), temperature source Core, resp. rate (!) 22, height _0  (1.575 m), weight 72.5 kg, SpO2 95 %. CVP:  [12 mmHg] 12 mmHg  Vent Mode: PRVC FiO2 (%):  [90 %] 90 % Set Rate:  [15 bmp] 15 bmp Vt Set:  [430 mL]  430 mL PEEP:  [12 cmH20] 12 cmH20 Plateau Pressure:  [24 cmH20-33 cmH20] 27 cmH20   Intake/Output Summary (Last 24 hours) at 04/10/2018 1246 Last data filed at 04/10/2018 1012 Gross per 24 hour  Intake 2613.49 ml  Output 1725 ml  Net 888.49 ml   Filed Weights   04/08/18 0500 04/09/18 0500 04/10/18 0500  Weight: 72.3 kg 72.4 kg 72.5 kg    Examination:  General: elderly female lying in bed on vent, intermittent agitation  HEENT: MM pink/moist, ETT, scleral edema  Neuro: sedate, agitation with stimulation / shakes head, moves all extremities  CV: s1s2 rrr, no m/r/g PULM: even/non-labored, lungs bilaterally coarse with crackles  PN:TIRW, non-tender, bsx4 active  Extremities: warm/dry, trace LE edema  Skin: no rashes or lesions   Assessment & Plan:   Acute on Chronic Respiratory Failure with Hypoxemia - in setting of metapneumovirus, CAP and baseline pulmonary hypertension, sarcoidosis  P: Continue PRVC 8 cc/kg as rest mode Wean PEEP / FiO2 for sats >90% Will likely need tracheostomy placement  Minimize sedation as able  Follow cultures, intermittent CXR  Community Acquired PNA secondary to Metapneumovirus, ARDS -bilateral infiltrates on CXR, unchanged with diuresis/abx.  Consider amiodarone lung, sarcoid, hemoptysis  P: Resume empiric broad spectrum abx with fevers Assess CT chest  May need steroids  Follow cultures   Agitated Delirium  P: Klonopin PT  PRN versed  Fentanyl gtt   AKI P: Trend BMP / urinary output Replace electrolytes as indicated  Avoid nephrotoxic agents, ensure adequate renal perfusion  Electrolyte Disturbances  -hypernatremia, hyperchloremia P: Follow Na  Increase free water to 400 ml Q4   Transaminitis - Possibly congestive hepatopathy vs shock liver from prior hypotension P: Trend LFT's   Anemia - suspect related to acute illness + dilution, no evidence of bleeding  - transfusion PRBC 11/6 P: Follow CBC Transfuse per ICU  guidelines  Follow up H/H post transfusion   Atrial Fibrillation  -amiodarone, eliquis pre-admit P: Hold heparin gtt  SCD's  Amiodarone PT > may need to consider different agent  Follow QTc   Brachial Vein Segment Thrombosis  -home eliquis on hold due to AKI P: Hold heparin with bloody ETT secretions   Hyperglycemia - well controlled  P: Lantus 14 units QD SSI, moderate scale  Pulmonary Sarcoidosis  P: Supportive care  OSA  P: CPAP if/when extubated     Gout P: Hold home allopurinol with AKI   Disposition / Summary of Today's Plan 04/10/18   Will likely need trach.  CT chest to eval infiltrates.     Diet: TF Pain: Anxiety/Delirium protocol (if indicated): RASS goal 0 to -1 VAP protocol (if indicated): yes DVT prophylaxis: SCD's.  GI prophylaxis: famotidine Hyperglycemia protocol: SSI Mobility: bed rest Code Status: FC Family Communication: Family updated 11/5 via phone.  No family available on NP rounds 11/6.   Ancillary tests   Recent Labs  Lab 04/08/18 0439 04/09/18 0352 04/10/18 0340  HGB 7.7* 7.8* 6.9*  HCT 25.1* 25.9* 23.2*  WBC 13.3* 17.3* 17.2*  PLT 120* 145* 136*   Recent Labs  Lab 04/07/18 0455 04/08/18 0439 04/09/18 0352 04/09/18 1140 04/10/18 0452  NA 149* 149* 148* 147* 150*  K 3.9 2.9* 6.2* 5.8* 3.6  CL 101 105 107 116* 109  CO2 35* 34* 33* 25 33*  GLUCOSE 117* 141* 143* 116* 131*  BUN 166* 160* 169* 131* 167*  CREATININE 2.32* 2.31* 2.24* 1.67* 2.30*  CALCIUM 8.8* 8.7* 8.3* 6.1* 8.3*   PCXR 11/6 R>L airspace disease, diffuse ASD  Critical care time:  30 minutes     Noe Gens, NP-C  Pulmonary & Critical Care Pgr: [phone] or if no answer [phone] 04/10/2018, 12:46 PM

## 2018-04-10 NOTE — Progress Notes (Signed)
Pharmacy Antibiotic Note  Heather Mckay is a 77 y.o. female admitted on 03/31/2018 with pneumonia.    Plan: Cefepime 2 g q24h vanc 2 g q48 Monitor renal fx cx vt prn  Height: 5\' 2"  (157.5 cm) Weight: 159 lb 13.3 oz (72.5 kg) IBW/kg (Calculated) : 50.1  Temp (24hrs), Avg:100.1 F (37.8 C), Min:99 F (37.2 C), Max:100.6 F (38.1 C)  Recent Labs  Lab 04/07/18 0455 04/07/18 1320 04/08/18 0439 04/09/18 0352 04/09/18 1140 04/10/18 0340 04/10/18 0452  WBC 11.5* 13.9* 13.3* 17.3*  --  17.2*  --   CREATININE 2.32*  --  2.31* 2.24* 1.67*  --  2.30*    Estimated Creatinine Clearance: 19.4 mL/min (A) (by C-G formula based on SCr of 2.3 mg/dL (H)).    Allergies  Allergen Reactions  . Hydroxychloroquine Swelling    Patient reported increased swelling of her thyroid gland area. But patient has thyromegaly. Unclear if it is really from Plaquenil or not.  . Hydrocodone     Itching  . Imdur [Isosorbide Dinitrate]     Itching  . Percocet [Oxycodone-Acetaminophen]     Itching   Levester Fresh, PharmD, BCPS, BCCCP Clinical Pharmacist [phone]  Please check AMION for all [place] numbers  04/10/2018 1:42 PM

## 2018-04-10 NOTE — Progress Notes (Signed)
CRITICAL VALUE ALERT  Critical Value: HGB  Date & Time Notied: 04/10/2018  Provider Notified: Warren Lacy MD  Orders Received/Actions taken: Transfuse 1 unit of blood.

## 2018-04-10 NOTE — Progress Notes (Signed)
[facility] Progress Note Patient Name: Denim Start DOB: 1940/06/10 MRN: [id number]   Date of Service  04/10/2018  HPI/Events of Note  Anemia - Hgb = 6.9.   eICU Interventions  Will order: 1. Transfuse 1 unit PRBC now.      Intervention Category Major Interventions: Other:  Lysle Dingwall 04/10/2018, 5:54 AM

## 2018-04-11 ENCOUNTER — Inpatient Hospital Stay (HOSPITAL_COMMUNITY): Payer: Medicare Other

## 2018-04-11 LAB — GLUCOSE, CAPILLARY
GLUCOSE-CAPILLARY: 142 mg/dL — AB (ref 70–99)
GLUCOSE-CAPILLARY: 145 mg/dL — AB (ref 70–99)
Glucose-Capillary: 132 mg/dL — ABNORMAL HIGH (ref 70–99)
Glucose-Capillary: 132 mg/dL — ABNORMAL HIGH (ref 70–99)
Glucose-Capillary: 136 mg/dL — ABNORMAL HIGH (ref 70–99)
Glucose-Capillary: 147 mg/dL — ABNORMAL HIGH (ref 70–99)
Glucose-Capillary: 190 mg/dL — ABNORMAL HIGH (ref 70–99)

## 2018-04-11 LAB — CBC
HCT: 26.6 % — ABNORMAL LOW (ref 36.0–46.0)
Hemoglobin: 7.8 g/dL — ABNORMAL LOW (ref 12.0–15.0)
MCH: 27.2 pg (ref 26.0–34.0)
MCHC: 29.3 g/dL — AB (ref 30.0–36.0)
MCV: 92.7 fL (ref 80.0–100.0)
PLATELETS: 132 10*3/uL — AB (ref 150–400)
RBC: 2.87 MIL/uL — ABNORMAL LOW (ref 3.87–5.11)
RDW: 19.6 % — AB (ref 11.5–15.5)
WBC: 15 10*3/uL — ABNORMAL HIGH (ref 4.0–10.5)
nRBC: 0.6 % — ABNORMAL HIGH (ref 0.0–0.2)

## 2018-04-11 LAB — TYPE AND SCREEN
ABO/RH(D): O POS
Antibody Screen: NEGATIVE
UNIT DIVISION: 0
Unit division: 0

## 2018-04-11 LAB — BPAM RBC
Blood Product Expiration Date: 201912012359
Blood Product Expiration Date: 201912012359
ISSUE DATE / TIME: 201911030852
ISSUE DATE / TIME: 201911060841
UNIT TYPE AND RH: 5100
Unit Type and Rh: 5100

## 2018-04-11 LAB — PROCALCITONIN: Procalcitonin: 6.71 ng/mL

## 2018-04-11 LAB — BASIC METABOLIC PANEL
ANION GAP: 10 (ref 5–15)
BUN: 186 mg/dL — ABNORMAL HIGH (ref 8–23)
CO2: 30 mmol/L (ref 22–32)
Calcium: 8.8 mg/dL — ABNORMAL LOW (ref 8.9–10.3)
Chloride: 107 mmol/L (ref 98–111)
Creatinine, Ser: 2.47 mg/dL — ABNORMAL HIGH (ref 0.44–1.00)
GFR calc Af Amer: 21 mL/min — ABNORMAL LOW (ref >60)
GFR, EST NON AFRICAN AMERICAN: 18 mL/min — AB (ref >60)
GLUCOSE: 129 mg/dL — AB (ref 70–99)
POTASSIUM: 3.2 mmol/L — AB (ref 3.5–5.1)
Sodium: 147 mmol/L — ABNORMAL HIGH (ref 135–145)

## 2018-04-11 LAB — HEPARIN LEVEL (UNFRACTIONATED): Heparin Unfractionated: 0.29 IU/mL — ABNORMAL LOW (ref 0.30–0.70)

## 2018-04-11 MED ORDER — HEPARIN (PORCINE) 25000 UT/250ML-% IV SOLN
1000.0000 [IU]/h | INTRAVENOUS | Status: DC
  Administered 2018-04-11: 900 [IU]/h via INTRAVENOUS
  Administered 2018-04-12: 1000 [IU]/h via INTRAVENOUS
  Filled 2018-04-11 (×2): qty 250

## 2018-04-11 MED ORDER — METHYLPREDNISOLONE SODIUM SUCC 40 MG IJ SOLR
40.0000 mg | Freq: Four times a day (QID) | INTRAMUSCULAR | Status: DC
  Administered 2018-04-11 – 2018-04-15 (×16): 40 mg via INTRAVENOUS
  Filled 2018-04-11 (×16): qty 1

## 2018-04-11 MED ORDER — POTASSIUM CHLORIDE 20 MEQ/15ML (10%) PO SOLN
40.0000 meq | Freq: Two times a day (BID) | ORAL | Status: AC
  Administered 2018-04-11 (×2): 40 meq
  Filled 2018-04-11 (×2): qty 30

## 2018-04-11 NOTE — Progress Notes (Signed)
ANTICOAGULATION CONSULT NOTE - FOLLOW UP    HL = 0.29 (goal 0.3 - 0.7 units/mL) Heparin dosing weight = 67 kg   Assessment: 76 YOF with history of Afib on Eliquis PTA (last dose 03/20/18).  Pharmacy consulted to manage IV heparin bridge while Eliquis is on hold.  Patient may have a brachial vein thrombosis.  Heparin level is slightly below goal.  No bleeding reported.   Plan: Increase heparin gtt to 1000 units/hr F/U AM labs   Kemar Pandit D. Mina Marble, PharmD, BCPS, [place] 04/11/2018, 10:10 PM

## 2018-04-11 NOTE — Progress Notes (Signed)
CPT held patient is off the floor.

## 2018-04-11 NOTE — Progress Notes (Signed)
ANTICOAGULATION CONSULT NOTE - Initial Consult   Pharmacy Consult for heparin Indication: atrial fibrillation / ? RUE DVT   Allergies Allergen Reactions . Hydroxychloroquine Swelling     Patient reported increased swelling of her thyroid gland area.  But patient has thyromegaly. Unclear if it is really from Plaquenil or not. . Hydrocodone       Itching . Imdur [Isosorbide Dinitrate]       Itching . Percocet [Oxycodone-Acetaminophen]       Itching   Patient Measurements: Height: 5\' 2"  (157.5 cm) Weight: 154 lb 1.6 oz (69.9 kg) IBW/kg (Calculated) : 50.1 Heparin Dosing Weight: 67 kg   Vital Signs: Temp: 100.4 F (38.0 C) (11/07 0800) Temp Source: Rectal (11/07 0800) BP: 166/128 (11/04 1200) Pulse Rate: 90  (11/04 1200)   Labs: Recent Labs (last 2 labs)  Recent Labs 1340  04/10/18 1840  04/10/18 0412 04/11/18  HGB 7.8         8.3       6.5 HCT 26.6 27.2 23.2 PLT 132  136 CREATININE 2.47  --  2.30  APTT 47 (11/2)   Estimated Creatinine Clearance: 17.7 mL/min (A) (by C-G formula based on SCr of 2.47 mg/dL (H)).   Medical History: Past Medical History: Diagnosis Date . Asthma   . CHF (congestive heart failure) ([place])   . GERD (gastroesophageal reflux disease)   . HLD (hyperlipidemia)   . Hypertension     Assessment: 77 yo F presents with worsening SOB / multifocal PNA. On Eliquis PTA for Afib with CHADsVASC of 7. Also with possible brachial vein segment thrombosis. Heparin drip started on 11/4 stopped due to bleeding from ogt and ballard.  Bronchoscopy 11/6 confirmed no active bleeding.  Has not had Eliquis since 10/16. Last [place] heparin was at 1331 11/4. Last heparin gtt was 11/4 2200.  Hgb low at 7.8, plts low at 132. Received transfusion on 11/3 and 11/6.   Goal of Therapy:  Heparin level 0.3-0.7 units/ml Monitor platelets by anticoagulation protocol: Yes   Plan:  No heparin bolus  Start heparin gtt at 900 units/hr Check heparin level in 8 hrs Monitor daily  heparin level, CBC, s/s of bleed

## 2018-04-11 NOTE — Progress Notes (Signed)
NAME:  Heather Mckay, MRN:  [id number], DOB:  11-14-1940, LOS: 66 ADMISSION DATE:  03/23/2018, CONSULTATION DATE:  10/21 REFERRING MD:  Sloan Leiter, CHIEF COMPLAINT:  Dyspnea   Brief History   77 y/o female with CHF, pulmonary sarcoidosis was admitted on 10/20 to the hospitalist service with dyspnea, myalgias, cough noted to have a positive RVP for metapneumovirus. Concern for LLL CAP.  Intubated with septic shock 10/22.     Past Medical History  Sarcoidosis, Pulmonary hypertension, CHF, HTN, HLD, GERD, Asthma, infasive ductal carcinoma, MGUS, OSA on CPAP and on home [facility] Events   10/20 Admit 10/21 PCCM consulted 10/22 Early AM intubated, in shock 10/25 Hgb 6.5, 1u pRBC ordered 11/04 Seroquel stopped for prolonged QTc 11/5 Bronch, Heparin started but then held for hemoptysis  Consults:  10/21 PCCM   Procedures (surgical and bedside):  10/22 ETT  Significant Diagnostic Tests:  12/2016 TTE > RVSP 69 mmHb, LVEF 60-65%, mod LVH, normal left atrium 10/22 TTE >> 55-60%, no wall motion abnormalities, tricuspid valve thickening c/w rheumatic disease, moderate regurgitation, PA peak pressure 64 UE Duplex 10/22 >> positive for deep vein thrombosis of right brachial veins  CT chest.  04/11/2018-diffuse bilateral consolidation, small pleural effusion.  Mild emphysema, coronary artery calcification.  I have reviewed the images personally.  Micro Data:  RVP 10/20 >> positive for metapneumovirus  BCx2 10/20 >> negative  Sputum 10/20 >> MRSA pos Tracheal aspirate 11/5 >>  BAL 11/5 >>  BAL AFB 11/5 >>  BAL Fungal 11/5 >>   Antimicrobials:  Ceftriaxone 10/20 >>10/24 Azithro 10/20 >> 10/24 Zosyn/vanc x1 dose 10/21 Vanco (empiric) 11/6 >>  Cefepime (empiric) 11/6 >>   Subjective:  RN reports pt remains febrile intermittently. On cooling blanket.  Remains on 200 mcg's fentanyl. Transfused 1 unit PRBC for Hgb 6.9  Objective   Blood pressure (!) 115/50, pulse 92,  temperature (!) 100.4 F (38 C), temperature source Rectal, resp. rate 17, height _0  (1.575 m), weight 69.9 kg, SpO2 98 %.    Vent Mode: PRVC FiO2 (%):  [90 %] 90 % Set Rate:  [15 bmp] 15 bmp Vt Set:  [430 mL] 430 mL PEEP:  [12 cmH20] 12 cmH20 Plateau Pressure:  [20 cmH20-30 cmH20] 20 cmH20   Intake/Output Summary (Last 24 hours) at 04/11/2018 1202 Last data filed at 04/11/2018 1000 Gross per 24 hour  Intake 4609.02 ml  Output 1080 ml  Net 3529.02 ml   Filed Weights   04/09/18 0500 04/10/18 0500 04/11/18 0500  Weight: 72.4 kg 72.5 kg 69.9 kg    Examination:  Gen:      No acute distress, ETT HEENT:  EOMI, sclera anicteric Neck:     No masses; no thyromegaly Lungs:    Clear to auscultation bilaterally; normal respiratory effort CV:         Regular rate and rhythm; no murmurs Abd:      + bowel sounds; soft, non-tender; no palpable masses, no distension Ext:    No edema; adequate peripheral perfusion Skin:      Warm and dry; no rash Neuro: Sedated, intubated  Assessment & Plan:  Acute on Chronic Respiratory Failure with Hypoxemia - in setting of metapneumovirus, CAP and baseline pulmonary hypertension, sarcoidosis  P: Wean PEEP and FiO2 as tolerated. Will likely need tracheostomy placement when vent setting are lower Appreciate help from ENT service. Follow BAL cultures Start empiric steroids Stop amiodarone  Community Acquired PNA secondary to [place], ARDS -bilateral infiltrates on  CXR, unchanged with diuresis/abx.  Consider amiodarone lung, sarcoid, hemoptysis  P: Continue empiric broad spectrum abx with fevers Follow cultures   Agitated Delirium  P: Klonopin PT  PRN versed  Fentanyl gtt   AKI P: Trend BMP / urinary output Replace electrolytes as indicated Avoid nephrotoxic agents, ensure adequate renal perfusion  Electrolyte Disturbances  -hypernatremia, hyperchloremia P: Follow Na  Continue free water  Transaminitis - Possibly congestive  hepatopathy vs shock liver from prior hypotension P: Trend LFT's   Anemia - suspect related to acute illness + dilution, no evidence of bleeding  - transfusion PRBC 11/6 P: Follow CBC Transfuse per ICU guidelines  Follow up H/H post transfusion   Atrial Fibrillation  -amiodarone, eliquis pre-admit P: SCD's  Stop amio out of concern for ILD  Brachial Vein Segment Thrombosis  -home eliquis on hold due to AKI P: Restart heparin gtt at low dose  Hyperglycemia - well controlled  P: Lantus 14 units QD SSI, moderate scale  Pulmonary Sarcoidosis  P: Supportive care  OSA  P: CPAP if/when extubated     Gout P: Hold home allopurinol with AKI  Disposition / Summary of Today's Plan 04/11/18   Trach when PEEP requirements are better Start steroids.  Continue abx    Diet: TF Pain: Anxiety/Delirium protocol (if indicated): RASS goal 0 to -1 VAP protocol (if indicated): yes DVT prophylaxis: SCD's.  GI prophylaxis: famotidine Hyperglycemia protocol: SSI Mobility: bed rest Code Status: FC Family Communication: Family updated 11/5 via phone.  No family available on NP rounds 11/6.   Ancillary tests   Recent Labs  Lab 04/09/18 0352 04/10/18 0340 04/10/18 1303 04/11/18 0412  HGB 7.8* 6.9* 8.3* 7.8*  HCT 25.9* 23.2* 27.2* 26.6*  WBC 17.3* 17.2*  --  15.0*  PLT 145* 136*  --  132*   Recent Labs  Lab 04/08/18 0439 04/09/18 0352 04/09/18 1140 04/10/18 0452 04/11/18 0552  NA 149* 148* 147* 150* 147*  K 2.9* 6.2* 5.8* 3.6 3.2*  CL 105 107 116* 109 107  CO2 34* 33* 25 33* 30  GLUCOSE 141* 143* 116* 131* 129*  BUN 160* 169* 131* 167* 186*  CREATININE 2.31* 2.24* 1.67* 2.30* 2.47*  CALCIUM 8.7* 8.3* 6.1* 8.3* 8.8*   The patient is critically ill with multiple organ system failure and requires high complexity decision making for assessment and support, frequent evaluation and titration of therapies, advanced monitoring, review of radiographic studies and  interpretation of complex data.   Critical Care Time devoted to patient care services, exclusive of separately billable procedures, described in this note is 35 minutes.   Marshell Garfinkel MD [facility] Pager [phone] If no answer or after 3pm call: [phone] 04/11/2018, 12:09 PM

## 2018-04-12 ENCOUNTER — Inpatient Hospital Stay (HOSPITAL_COMMUNITY): Payer: Medicare Other

## 2018-04-12 LAB — CBC
HCT: 24.7 % — ABNORMAL LOW (ref 36.0–46.0)
Hemoglobin: 7.6 g/dL — ABNORMAL LOW (ref 12.0–15.0)
MCH: 27.8 pg (ref 26.0–34.0)
MCHC: 30.8 g/dL (ref 30.0–36.0)
MCV: 90.5 fL (ref 80.0–100.0)
PLATELETS: 105 10*3/uL — AB (ref 150–400)
RBC: 2.73 MIL/uL — ABNORMAL LOW (ref 3.87–5.11)
RDW: 19.1 % — AB (ref 11.5–15.5)
WBC: 12.5 10*3/uL — ABNORMAL HIGH (ref 4.0–10.5)
nRBC: 0.2 % (ref 0.0–0.2)

## 2018-04-12 LAB — BASIC METABOLIC PANEL
Anion gap: 15 (ref 5–15)
BUN: 186 mg/dL — AB (ref 8–23)
CO2: 26 mmol/L (ref 22–32)
CREATININE: 2.49 mg/dL — AB (ref 0.44–1.00)
Calcium: 9 mg/dL (ref 8.9–10.3)
Chloride: 101 mmol/L (ref 98–111)
GFR calc Af Amer: 21 mL/min — ABNORMAL LOW (ref >60)
GFR, EST NON AFRICAN AMERICAN: 18 mL/min — AB (ref >60)
GLUCOSE: 216 mg/dL — AB (ref 70–99)
Potassium: 4 mmol/L (ref 3.5–5.1)
Sodium: 142 mmol/L (ref 135–145)

## 2018-04-12 LAB — GLUCOSE, CAPILLARY
GLUCOSE-CAPILLARY: 217 mg/dL — AB (ref 70–99)
Glucose-Capillary: 187 mg/dL — ABNORMAL HIGH (ref 70–99)
Glucose-Capillary: 216 mg/dL — ABNORMAL HIGH (ref 70–99)
Glucose-Capillary: 194 mg/dL — ABNORMAL HIGH (ref 70–99)
Glucose-Capillary: 171 mg/dL — ABNORMAL HIGH (ref 70–99)
Glucose-Capillary: 194 mg/dL — ABNORMAL HIGH (ref 70–99)

## 2018-04-12 LAB — MAGNESIUM: Magnesium: 1.9 mg/dL (ref 1.7–2.4)

## 2018-04-12 LAB — PROCALCITONIN: Procalcitonin: 6.31 ng/mL

## 2018-04-12 LAB — HEPARIN LEVEL (UNFRACTIONATED): Heparin Unfractionated: 0.38 IU/mL (ref 0.30–0.70)

## 2018-04-12 LAB — PHOSPHORUS: Phosphorus: 5.6 mg/dL — ABNORMAL HIGH (ref 2.5–4.6)

## 2018-04-12 MED ORDER — INSULIN GLARGINE 100 UNIT/ML ~~LOC~~ SOLN: 18.0000 [IU] | Freq: Every day | SUBCUTANEOUS | Status: DC

## 2018-04-12 MED ORDER — FUROSEMIDE 10 MG/ML IJ SOLN
40.0000 mg | Freq: Two times a day (BID) | INTRAMUSCULAR | Status: DC
  Administered 2018-04-12 (×2): 40 mg via INTRAVENOUS
  Filled 2018-04-12 (×2): qty 4

## 2018-04-12 MED ORDER — INSULIN GLARGINE 100 UNIT/ML ~~LOC~~ SOLN
18.0000 [IU] | Freq: Every day | SUBCUTANEOUS | Status: DC
  Administered 2018-04-12 – 2018-04-14 (×3): 18 [IU] via SUBCUTANEOUS
  Filled 2018-04-12 (×3): qty 0.18

## 2018-04-12 MED ORDER — FERROUS SULFATE 300 (60 FE) MG/5ML PO SYRP
60.0000 mg | ORAL_SOLUTION | Freq: Every day | ORAL | Status: DC
  Administered 2018-04-12 – 2018-04-17 (×6): 60 mg
  Filled 2018-04-12 (×7): qty 5

## 2018-04-12 MED ORDER — FREE WATER
200.0000 mL | Freq: Three times a day (TID) | Status: DC
  Administered 2018-04-12 – 2018-04-17 (×15): 200 mL

## 2018-04-12 NOTE — Progress Notes (Signed)
ENT Consult Progress Note  Subjective: Intubated, sedated. Remains on 80% FiO2, PEEP 12.    Objective: Vitals:   04/12/18 1600 04/12/18 1608  BP: (!) 145/61 (!) 145/61  Pulse: 89 94  Resp:  (!) 23  Temp:    SpO2: 91% 92%    Physical Exam: Intubated, sedated, slightly more alert. Vent Mode: PRVC FiO2 (%):  [60 %-80 %] 70 % Set Rate:  [15 bmp] 15 bmp Vt Set:  [430 mL] 430 mL PEEP:  [12 cmH20] 12 cmH20 Plateau Pressure:  [11 cmH20-22 cmH20] 20 cmH20   Assessment: Heather Mckay 77 y.o. female who presents with respiratory failure. ENT consulted for tracheostomy, but ventilator settings have been too high.    Plan:  -Will plan *tentatively* for trach on Monday IF patient's ventilator settings are improved.  -Please make NPO/hold TF midnight Sunday evening.  -Please hold heparin gtt at midnight Sunday evening.  -Called patient's son, Jeneen Rinks, to discuss.    Thank you for involving [facility] in the care of this patient. Should you need further assistance, please call our office at [phone].    Gavin Pound, MD

## 2018-04-12 NOTE — Progress Notes (Signed)
ANTICOAGULATION CONSULT NOTE - Initial Consult   Pharmacy Consult for heparin Indication: atrial fibrillation / ? RUE DVT   Allergies Allergen Reactions . Hydroxychloroquine Swelling     Patient reported increased swelling of her thyroid gland area.  But patient has thyromegaly. Unclear if it is really from Plaquenil or not. . Hydrocodone       Itching . Imdur [Isosorbide Dinitrate]       Itching . Percocet [Oxycodone-Acetaminophen]       Itching   Patient Measurements: Height: 5\' 2"  (157.5 cm) Weight: 154 lb 1.6 oz (69.9 kg) IBW/kg (Calculated) : 50.1 Heparin Dosing Weight: 67 kg   Vital Signs: Temp: 100.4 F (38.0 C) (11/07 0800) Temp Source: Rectal (11/07 0800) BP: 166/128 (11/04 1200) Pulse Rate: 90  (11/04 1200)   Labs: Recent Labs (last 2 labs)  Recent Labs 1340  04/10/18 1840  04/10/18 0412 04/11/18  HGB 7.8         8.3       6.5 HCT 26.6 27.2 23.2 PLT 132  136 CREATININE 2.47  --  2.30  APTT 47 (11/2)   Estimated Creatinine Clearance: 17.7 mL/min (A) (by C-G formula based on SCr of 2.47 mg/dL (H)).   Medical History: Past Medical History: Diagnosis Date . Asthma   . CHF (congestive heart failure) ([place])   . GERD (gastroesophageal reflux disease)   . HLD (hyperlipidemia)   . Hypertension     Assessment: 77 yo F presents with worsening SOB / multifocal PNA. On Eliquis PTA for Afib with CHADsVASC of 7. Also with possible brachial vein segment thrombosis. Heparin drip started on 11/4 stopped due to bleeding from ogt and ballard.  Bronchoscopy 11/6 confirmed no active bleeding.  Has not had Eliquis since 10/16. Last [place] heparin was at 1331 11/4. Last heparin gtt was 11/4 2200.  Hgb low at 7.8, plts low at 132. Received transfusion on 11/3 and 11/6.  Hep lvl therapeutic today 1000 units/hr   Goal of Therapy:  Heparin level 0.3-0.7 units/ml Monitor platelets by anticoagulation protocol: Yes   Plan:  Continue heparin 1000 units/hr Monitor daily Hl, s/sx  of bleeding, CBC daily  Levester Fresh, PharmD, BCPS, BCCCP Clinical Pharmacist [phone]  Please check AMION for all [place] numbers  04/12/2018 10:24 AM

## 2018-04-12 NOTE — Progress Notes (Signed)
NAME:  Heather Mckay, MRN:  [id number], DOB:  [birth year], LOS: 30 ADMISSION DATE:  04/04/2018, CONSULTATION DATE:  10/21 REFERRING MD:  Sloan Leiter, CHIEF COMPLAINT:  Dyspnea   Brief History   77 y/o female with CHF, pulmonary sarcoidosis was admitted on 10/20 to the hospitalist service with dyspnea, myalgias, cough noted to have a positive RVP for metapneumovirus. Concern for LLL CAP.  Intubated with septic shock 10/22.     Past Medical History  Sarcoidosis, Pulmonary hypertension, CHF, HTN, HLD, GERD, Asthma, infasive ductal carcinoma, MGUS, OSA on CPAP and on home [facility] Events   10/20 Admit 10/21 PCCM consulted 10/22 Early AM intubated, in shock 10/25 Hgb 6.5, 1u pRBC ordered 11/04 Seroquel stopped for prolonged QTc 11/5 Bronch, Heparin started but then held for hemoptysis 11/7 Heparin restarted, empiric steroids 11/8 Started lasix for diuresis  Consults:  10/21 PCCM   Procedures (surgical and bedside):  10/22 ETT  Significant Diagnostic Tests:  12/2016 TTE > RVSP 69 mmHb, LVEF 60-65%, mod LVH, normal left atrium 10/22 TTE >> 55-60%, no wall motion abnormalities, tricuspid valve thickening c/w rheumatic disease, moderate regurgitation, PA peak pressure 64 UE Duplex 10/22 >> positive for deep vein thrombosis of right brachial veins  CT chest.  04/11/2018-diffuse bilateral consolidation, small pleural effusion.  Mild emphysema, coronary artery calcification.  I have reviewed the images personally.  Micro Data:  RVP 10/20 >> positive for metapneumovirus  BCx2 10/20 >> negative  Sputum 10/20 >> MRSA pos Tracheal aspirate 11/5 >>  BAL 11/5 >>  BAL AFB 11/5 >>  BAL Fungal 11/5 >>   Antimicrobials:  Ceftriaxone 10/20 >>10/24 Azithro 10/20 >> 10/24 Zosyn/vanc x1 dose 10/21 Vanco (empiric) 11/6 >> 11/8 Cefepime (empiric) 11/6 >>   Subjective:  More awake today, continues on high PEEP/FiO2.  Objective   Blood pressure (!) 165/53, pulse 92, temperature (!)  97.4 F (36.3 C), temperature source Core, resp. rate (!) 24, height _0  (1.575 m), weight 76.5 kg, SpO2 95 %.    Vent Mode: PRVC FiO2 (%):  [80 %-90 %] 80 % Set Rate:  [15 bmp] 15 bmp Vt Set:  [430 mL] 430 mL PEEP:  [12 cmH20] 12 cmH20 Plateau Pressure:  [11 cmH20-22 cmH20] 11 cmH20   Intake/Output Summary (Last 24 hours) at 04/12/2018 1036 Last data filed at 04/12/2018 0830 Gross per 24 hour  Intake 3582.1 ml  Output 1352 ml  Net 2230.1 ml   Filed Weights   04/10/18 0500 04/11/18 0500 04/12/18 0500  Weight: 72.5 kg 69.9 kg 76.5 kg    Examination:  Gen:      No acute distress HEENT:  EOMI, sclera anicteric, ET tube Neck:     No masses; no thyromegaly Lungs:    Clear to auscultation bilaterally; normal respiratory effort CV:         Regular rate and rhythm; no murmurs Abd:      + bowel sounds; soft, non-tender; no palpable masses, no distension Ext:    No edema; adequate peripheral perfusion Skin:      Warm and dry; no rash Neuro: Concise, moves all extremities.  Nonpurposeful.  Assessment & Plan:  Acute on Chronic Respiratory Failure with Hypoxemia - in setting of metapneumovirus, CAP and baseline pulmonary hypertension, sarcoidosis  P: Wean PEEP and FiO2 as tolerated. Will likely need tracheostomy placement when vent setting are lower Appreciate help from ENT service. Follow BAL cultures Continue empiric steroids. Stop amiodarone Start Lasix for diuresis.  Community Acquired PNA secondary  to [place], ARDS -bilateral infiltrates on CXR, unchanged with diuresis/abx.  Consider amiodarone lung, sarcoid, hemoptysis  P: Continue empiric broad spectrum abx with fevers Follow cultures   Agitated Delirium  P: Klonopin PT  PRN versed  Fentanyl gtt   AKI P: Trend BMP / urinary output Replace electrolytes as indicated Avoid nephrotoxic agents, ensure adequate renal perfusion  Electrolyte Disturbances  -hypernatremia, hyperchloremia P: Follow Na  Reduce  free water.  Transaminitis - Possibly congestive hepatopathy vs shock liver from prior hypotension P: Trend LFT's   Anemia - suspect related to acute illness + dilution, no evidence of bleeding  - transfusion PRBC 11/6 P: Follow CBC Transfuse per ICU guidelines  Follow up H/H post transfusion   Atrial Fibrillation  -amiodarone, eliquis pre-admit P: SCD's  Stop amio out of concern for ILD  Brachial Vein Segment Thrombosis  -home eliquis on hold due to AKI P: Continue heparin drip.  Watch for hemoptysis.  Hyperglycemia - well controlled  P: Increase Lantus to 18. SSI, moderate scale  Pulmonary Sarcoidosis  P: Supportive care  OSA  P: CPAP if/when extubated     Gout P: Hold home allopurinol with AKI  Disposition / Summary of Today's Plan 04/12/18   Trach when PEEP requirements are better Continue steroids.  Start Lasix Continue antibiotics.    Diet: TF Pain: Anxiety/Delirium protocol (if indicated): RASS goal 0 to -1 VAP protocol (if indicated): yes DVT prophylaxis: SCD's.  GI prophylaxis: famotidine Hyperglycemia protocol: SSI Mobility: bed rest Code Status: FC Family Communication: Family updated 11/5 via phone.  No family available on NP rounds 11/6.   Ancillary tests   Recent Labs  Lab 04/10/18 0340 04/10/18 1303 04/11/18 0412 04/12/18 0409  HGB 6.9* 8.3* 7.8* 7.6*  HCT 23.2* 27.2* 26.6* 24.7*  WBC 17.2*  --  15.0* 12.5*  PLT 136*  --  132* 105*   Recent Labs  Lab 04/09/18 0352 04/09/18 1140 04/10/18 0452 04/11/18 0552 04/12/18 0409  NA 148* 147* 150* 147* 142  K 6.2* 5.8* 3.6 3.2* 4.0  CL 107 116* 109 107 101  CO2 33* 25 33* 30 26  GLUCOSE 143* 116* 131* 129* 216*  BUN 169* 131* 167* 186* 186*  CREATININE 2.24* 1.67* 2.30* 2.47* 2.49*  CALCIUM 8.3* 6.1* 8.3* 8.8* 9.0  MG  --   --   --   --  1.9  PHOS  --   --   --   --  5.6*   The patient is critically ill with multiple organ system failure and requires high complexity  decision making for assessment and support, frequent evaluation and titration of therapies, advanced monitoring, review of radiographic studies and interpretation of complex data.   Critical Care Time devoted to patient care services, exclusive of separately billable procedures, described in this note is 35  minutes.   Marshell Garfinkel MD  Pulmonary and Critical Care Pager [phone] If no answer or after 3pm call: [phone] 04/12/2018, 10:42 AM

## 2018-04-13 ENCOUNTER — Inpatient Hospital Stay (HOSPITAL_COMMUNITY): Payer: Medicare Other

## 2018-04-13 LAB — CBC
HCT: 24.6 % — ABNORMAL LOW (ref 36.0–46.0)
Hemoglobin: 7.3 g/dL — ABNORMAL LOW (ref 12.0–15.0)
MCH: 26.9 pg (ref 26.0–34.0)
MCHC: 29.7 g/dL — ABNORMAL LOW (ref 30.0–36.0)
MCV: 90.8 fL (ref 80.0–100.0)
NRBC: 0.7 % — AB (ref 0.0–0.2)
PLATELETS: 105 10*3/uL — AB (ref 150–400)
RBC: 2.71 MIL/uL — ABNORMAL LOW (ref 3.87–5.11)
RDW: 19.7 % — AB (ref 11.5–15.5)
WBC: 12 10*3/uL — ABNORMAL HIGH (ref 4.0–10.5)

## 2018-04-13 LAB — GLUCOSE, CAPILLARY
GLUCOSE-CAPILLARY: 210 mg/dL — AB (ref 70–99)
GLUCOSE-CAPILLARY: 224 mg/dL — AB (ref 70–99)
Glucose-Capillary: 182 mg/dL — ABNORMAL HIGH (ref 70–99)
Glucose-Capillary: 216 mg/dL — ABNORMAL HIGH (ref 70–99)
Glucose-Capillary: 224 mg/dL — ABNORMAL HIGH (ref 70–99)
Glucose-Capillary: 231 mg/dL — ABNORMAL HIGH (ref 70–99)
Glucose-Capillary: 251 mg/dL — ABNORMAL HIGH (ref 70–99)

## 2018-04-13 LAB — BASIC METABOLIC PANEL
Anion gap: 16 — ABNORMAL HIGH (ref 5–15)
BUN: 202 mg/dL — AB (ref 8–23)
CALCIUM: 8.8 mg/dL — AB (ref 8.9–10.3)
CHLORIDE: 102 mmol/L (ref 98–111)
CO2: 24 mmol/L (ref 22–32)
Creatinine, Ser: 2.68 mg/dL — ABNORMAL HIGH (ref 0.44–1.00)
GFR calc Af Amer: 19 mL/min — ABNORMAL LOW (ref >60)
GFR calc non Af Amer: 16 mL/min — ABNORMAL LOW (ref >60)
Glucose, Bld: 232 mg/dL — ABNORMAL HIGH (ref 70–99)
Potassium: 3.8 mmol/L (ref 3.5–5.1)
SODIUM: 142 mmol/L (ref 135–145)

## 2018-04-13 LAB — MAGNESIUM: Magnesium: 1.8 mg/dL (ref 1.7–2.4)

## 2018-04-13 LAB — HEPARIN LEVEL (UNFRACTIONATED): Heparin Unfractionated: 0.4 IU/mL (ref 0.30–0.70)

## 2018-04-13 LAB — CULTURE, BAL-QUANTITATIVE W GRAM STAIN
Culture: <1000
Gram Stain: NONE SEEN
Special Requests: NORMAL

## 2018-04-13 LAB — PROCALCITONIN: PROCALCITONIN: 5.93 ng/mL

## 2018-04-13 LAB — PHOSPHORUS: Phosphorus: 5.9 mg/dL — ABNORMAL HIGH (ref 2.5–4.6)

## 2018-04-13 MED ORDER — SODIUM CHLORIDE 0.9 % IV SOLN
1.0000 g | INTRAVENOUS | Status: DC
  Administered 2018-04-13 – 2018-04-14 (×2): 1 g via INTRAVENOUS
  Filled 2018-04-13 (×3): qty 1

## 2018-04-13 NOTE — Progress Notes (Signed)
NAME:  Heather Mckay, MRN:  [id number], DOB:  [birth year], LOS: 44 ADMISSION DATE:  03/11/2018, CONSULTATION DATE:  10/21 REFERRING MD:  Sloan Leiter, CHIEF COMPLAINT:  Dyspnea   Brief History   77 y/o female with CHF, pulmonary sarcoidosis was admitted on 10/20 to the hospitalist service with dyspnea, myalgias, cough noted to have a positive RVP for metapneumovirus. Concern for LLL CAP.  Intubated with septic shock 10/22.     Past Medical History  Sarcoidosis, Pulmonary hypertension, CHF, HTN, HLD, GERD, Asthma, infasive ductal carcinoma, MGUS, OSA on CPAP and on home O2 Possible sjogrens with positive ANA 1:640, positive Sjogren's, positive double-stranded DNA (Sees rheumatologist at [facility])  [facility] Events   10/20 Admit 10/21 PCCM consulted 10/22 Early AM intubated, in shock 10/25 Hgb 6.5, 1u pRBC ordered 11/04 Seroquel stopped for prolonged QTc 11/5 Bronch, Heparin started but then held for hemoptysis 11/7 Heparin restarted, empiric steroids 11/8 Started lasix for diuresis 11/9 lasix, heparin held for AKI and bleeding from ETT  Consults:  10/21 PCCM  11/6 ENT  Procedures (surgical and bedside):  10/22 ETT >   Significant Diagnostic Tests:  12/2016 TTE > RVSP 69 mmHb, LVEF 60-65%, mod LVH, normal left atrium 10/22 TTE >> 55-60%, no wall motion abnormalities, tricuspid valve thickening c/w rheumatic disease, moderate regurgitation, PA peak pressure 64 UE Duplex 10/22 >> positive for deep vein thrombosis of right brachial veins  CT chest.  04/11/2018-diffuse bilateral consolidation, small pleural effusion.  Mild emphysema, coronary artery calcification.  I have reviewed the images personally.  Micro Data:  RVP 10/20 >> positive for metapneumovirus  BCx2 10/20 >> negative  Sputum 10/20 >> MRSA pos Tracheal aspirate 11/5 >>  BAL 11/5 >>  BAL AFB 11/5 >>  BAL Fungal 11/5 >>   Antimicrobials:  Ceftriaxone 10/20 >>10/24 Azithro 10/20 >> 10/24 Zosyn/vanc x1 dose  10/21 Vanco (empiric) 11/6 >> 11/8 Cefepime (empiric) 11/6 >>   Subjective:  FiO2 weaning down.  Continues on follow-up PEEP Sedated, unresponsive on the vent.  Objective   Blood pressure (!) 135/54, pulse 86, temperature 98.6 F (37 C), temperature source Core, resp. rate 17, height _0  (1.575 m), weight 82.1 kg, SpO2 94 %.    Vent Mode: PRVC FiO2 (%):  [60 %-70 %] 60 % Set Rate:  [15 bmp] 15 bmp Vt Set:  [430 mL] 430 mL PEEP:  [12 cmH20] 12 cmH20 Plateau Pressure:  [20 cmH20-22 cmH20] 22 cmH20   Intake/Output Summary (Last 24 hours) at 04/13/2018 1047 Last data filed at 04/13/2018 0600 Gross per 24 hour  Intake 2672.67 ml  Output 818 ml  Net 1854.67 ml   Filed Weights   04/11/18 0500 04/12/18 0500 04/13/18 0500  Weight: 69.9 kg 76.5 kg 82.1 kg    Examination:  Gen:      No acute distress HEENT:  EOMI, sclera anicteric Neck:     No masses; no thyromegaly, ET tube Lungs:    Bilateral rhonchi. CV:         Regular rate and rhythm; no murmurs Abd:      + bowel sounds; soft, non-tender; no palpable masses, no distension Ext:    No edema; adequate peripheral perfusion Skin:      Warm and dry; no rash Neuro: Sedated, unresponsive  Assessment & Plan:  Acute on Chronic Respiratory Failure with Hypoxemia - in setting of metapneumovirus, CAP and baseline pulmonary hypertension, sarcoidosis, Sjogren's syndrome P: Wean PEEP and FiO2 as tolerated. Will likely need tracheostomy placement when  vent setting are lower Tentatively scheduled for 11/11 Appreciate help from ENT service. Follow BAL cultures Continue empiric steroids. Stop amiodarone  Community Acquired PNA secondary to [place], ARDS -bilateral infiltrates on CXR, unchanged with diuresis/abx.  Consider amiodarone lung, sarcoid, hemoptysis  P: Continue empiric broad spectrum abx with fevers Follow cultures   Agitated Delirium  P: Klonopin PT  PRN versed  Fentanyl gtt   AKI P: Trend BMP / urinary  output Replace electrolytes as indicated Avoid nephrotoxic agents, ensure adequate renal perfusion  Electrolyte Disturbances  -hypernatremia, hyperchloremia P: Follow electrolytes  Transaminitis - Possibly congestive hepatopathy vs shock liver from prior hypotension P: Trend LFT's   Anemia - suspect related to acute illness + dilution, no evidence of bleeding  - transfusion PRBC 11/6 P: Follow CBC Transfuse per ICU guidelines  Follow up H/H post transfusion   Atrial Fibrillation  -amiodarone, eliquis pre-admit P: SCD's  Stop amio out of concern for ILD  Brachial Vein Segment Thrombosis  -home eliquis on hold due to AKI P: Heparin drip on hold for blood from ET tube.  Hyperglycemia - well controlled  P: Continue Lantus, SSI coverage  Gout P: Hold home allopurinol with AKI  Disposition / Summary of Today's Plan 04/13/18   Wean down PEEP, FiO2     Diet: TF Pain: Anxiety/Delirium protocol (if indicated): RASS goal 0 to -1 VAP protocol (if indicated): yes DVT prophylaxis: SCD's.  GI prophylaxis: famotidine Hyperglycemia protocol: SSI Mobility: bed rest Code Status: FC Family Communication: Multiple discussions with the family and over the phone on 11/9.  They want everything done.  Ancillary tests   Recent Labs  Lab 04/11/18 0412 04/12/18 0409 04/13/18 0541  HGB 7.8* 7.6* 7.3*  HCT 26.6* 24.7* 24.6*  WBC 15.0* 12.5* 12.0*  PLT 132* 105* 105*   Recent Labs  Lab 04/09/18 1140 04/10/18 0452 04/11/18 0552 04/12/18 0409 04/13/18 0541  NA 147* 150* 147* 142 142  K 5.8* 3.6 3.2* 4.0 3.8  CL 116* 109 107 101 102  CO2 25 33* _0 GLUCOSE 116* 131* 129* 216* 232*  BUN 131* 167* 186* 186* 202*  CREATININE 1.67* 2.30* 2.47* 2.49* 2.68*  CALCIUM 6.1* 8.3* 8.8* 9.0 8.8*  MG  --   --   --  1.9 1.8  PHOS  --   --   --  5.6* 5.9*   The patient is critically ill with multiple organ system failure and requires high complexity decision making for  assessment and support, frequent evaluation and titration of therapies, advanced monitoring, review of radiographic studies and interpretation of complex data.   Critical Care Time devoted to patient care services, exclusive of separately billable procedures, described in this note is  35 minutes.   Marshell Garfinkel MD [place] [facility]monary and Critical [facility] Pager [phone] If no answer or after 3pm call: [phone] 04/13/2018, 11:00 AM

## 2018-04-13 NOTE — Progress Notes (Signed)
CPT held due to patient anxiety.

## 2018-04-13 NOTE — Progress Notes (Signed)
Pharmacy Antibiotic Note  Heather Mckay is a 77 y.o. female admitted on 03/27/2018 with pneumonia. She continues on cefepime. Pt is afebrile and WBC is trending down. Scr has increased.   Plan: Change cefepime to 1gm IV Q24H F/u renal fxn, C&S, clinical status and LOT  Height: _0  (157.5 cm) Weight: 181 lb (82.1 kg) IBW/kg (Calculated) : 50.1  Temp (24hrs), Avg:97.9 F (36.6 C), Min:96.5 F (35.8 C), Max:98.7 F (37.1 C)  Recent Labs  Lab 04/09/18 0352 04/09/18 1140 04/10/18 0340 04/10/18 0452 04/11/18 0412 04/11/18 0552 04/12/18 0409 04/13/18 0541  WBC 17.3*  --  17.2*  --  15.0*  --  12.5* 12.0*  CREATININE 2.24* 1.67*  --  2.30*  --  2.47* 2.49* 2.68*    Estimated Creatinine Clearance: 17.7 mL/min (A) (by C-G formula based on SCr of 2.68 mg/dL (H)).    10/20 Bcx: NGF 10/20 Resp panel: metapneumovirus  10/22 MRSA PCR: pos 10/22 TA: NGF 10/28 Bcx: CoNS 10/28 BCID: MR-CoNS (possible contaminant)   10/28 TA: normal flora 11/5 BAL: no orgs seen  CTX 10/20>>10/21; 10/22>> 10/24 Azith 10/20>>10/21; 10/22>> 10/24 Zosyn 10/22; 10/28 Vanc 10/21>10/22; 10/28; 11/6>>11/8 Cefepime 11/6>>  Salome Arnt, PharmD, BCPS Please see AMION for all pharmacy numbers 04/13/2018 11:16 AM

## 2018-04-14 ENCOUNTER — Inpatient Hospital Stay (HOSPITAL_COMMUNITY): Payer: Medicare Other

## 2018-04-14 LAB — BASIC METABOLIC PANEL
Anion gap: 17 — ABNORMAL HIGH (ref 5–15)
BUN: 213 mg/dL — AB (ref 8–23)
CHLORIDE: 104 mmol/L (ref 98–111)
CO2: 22 mmol/L (ref 22–32)
CREATININE: 2.89 mg/dL — AB (ref 0.44–1.00)
Calcium: 8.8 mg/dL — ABNORMAL LOW (ref 8.9–10.3)
GFR calc Af Amer: 17 mL/min — ABNORMAL LOW (ref >60)
GFR calc non Af Amer: 15 mL/min — ABNORMAL LOW (ref >60)
GLUCOSE: 287 mg/dL — AB (ref 70–99)
POTASSIUM: 3.9 mmol/L (ref 3.5–5.1)
Sodium: 143 mmol/L (ref 135–145)

## 2018-04-14 LAB — CBC
HCT: 23.6 % — ABNORMAL LOW (ref 36.0–46.0)
Hemoglobin: 7.4 g/dL — ABNORMAL LOW (ref 12.0–15.0)
MCH: 27.7 pg (ref 26.0–34.0)
MCHC: 31.4 g/dL (ref 30.0–36.0)
MCV: 88.4 fL (ref 80.0–100.0)
PLATELETS: 111 10*3/uL — AB (ref 150–400)
RBC: 2.67 MIL/uL — AB (ref 3.87–5.11)
RDW: 20 % — ABNORMAL HIGH (ref 11.5–15.5)
WBC: 13.3 10*3/uL — ABNORMAL HIGH (ref 4.0–10.5)
nRBC: 2.3 % — ABNORMAL HIGH (ref 0.0–0.2)

## 2018-04-14 LAB — MAGNESIUM: Magnesium: 1.8 mg/dL (ref 1.7–2.4)

## 2018-04-14 LAB — PHOSPHORUS: Phosphorus: 5.3 mg/dL — ABNORMAL HIGH (ref 2.5–4.6)

## 2018-04-14 LAB — GLUCOSE, CAPILLARY
GLUCOSE-CAPILLARY: 236 mg/dL — AB (ref 70–99)
GLUCOSE-CAPILLARY: 232 mg/dL — AB (ref 70–99)
GLUCOSE-CAPILLARY: 213 mg/dL — AB (ref 70–99)
Glucose-Capillary: 266 mg/dL — ABNORMAL HIGH (ref 70–99)
Glucose-Capillary: 295 mg/dL — ABNORMAL HIGH (ref 70–99)

## 2018-04-14 MED ORDER — INSULIN GLARGINE 100 UNIT/ML ~~LOC~~ SOLN
24.0000 [IU] | Freq: Every day | SUBCUTANEOUS | Status: DC
  Administered 2018-04-15 – 2018-04-17 (×3): 24 [IU] via SUBCUTANEOUS
  Filled 2018-04-14 (×4): qty 0.24

## 2018-04-14 MED ORDER — ACETAMINOPHEN 325 MG PO TABS
650.0000 mg | ORAL_TABLET | Freq: Four times a day (QID) | ORAL | Status: DC | PRN
  Administered 2018-04-14 – 2018-04-17 (×3): 650 mg
  Filled 2018-04-14 (×3): qty 2

## 2018-04-14 NOTE — Progress Notes (Addendum)
NAME:  Heather Mckay, MRN:  [id number], DOB:  Apr 16, 1941, LOS: 21 ADMISSION DATE:  03/15/2018, CONSULTATION DATE:  10/21 REFERRING MD:  Sloan Leiter, CHIEF COMPLAINT:  Dyspnea   Brief History   77 y/o female with CHF, pulmonary sarcoidosis was admitted on 10/20 to the hospitalist service with dyspnea, myalgias, cough noted to have a positive RVP for metapneumovirus. Concern for LLL CAP.  Intubated with septic shock 10/22.     Past Medical History  Sarcoidosis, Pulmonary hypertension, CHF, HTN, HLD, GERD, Asthma, infasive ductal carcinoma, MGUS, OSA on CPAP and on home O2 Possible sjogrens with positive ANA 1:640, positive Sjogren's ab, positive double-stranded DNA (Sees rheumatologist at [facility])  [place] [facility] Events   10/20 Admit 10/21 PCCM consulted 10/22 Early AM intubated, in shock 10/25 Hgb 6.5, 1u pRBC ordered 11/04 Seroquel stopped for prolonged QTc 11/5 Bronch, Heparin started but then held for hemoptysis 11/7 Heparin restarted, empiric steroids 11/8 Started lasix for diuresis 11/9 lasix, heparin held for AKI and bleeding from ETT  Consults:  10/21 PCCM  11/6 ENT  Procedures (surgical and bedside):  10/22 ETT >   Significant Diagnostic Tests:  12/2016 TTE > RVSP 69 mmHb, LVEF 60-65%, mod LVH, normal left atrium 10/22 TTE >> 55-60%, no wall motion abnormalities, tricuspid valve thickening c/w rheumatic disease, moderate regurgitation, PA peak pressure 64 UE Duplex 10/22 >> positive for deep vein thrombosis of right brachial veins  CT chest.  04/11/2018-diffuse bilateral consolidation, small pleural effusion.  Mild emphysema, coronary artery calcification.  I have reviewed the images personally.  Micro Data:  RVP 10/20 >> positive for metapneumovirus  BCx2 10/20 >> negative  Sputum 10/20 >> MRSA pos Tracheal aspirate 11/5 >>  BAL 11/5 >>  BAL AFB 11/5 >>  BAL Fungal 11/5 >>   Antimicrobials:  Ceftriaxone 10/20 >>10/24 Azithro 10/20 >> 10/24 Zosyn/vanc x1  dose 10/21 Vanco (empiric) 11/6 >> 11/8 Cefepime (empiric) 11/6 >>   Subjective:  Maintained on the vent, PEEP/FiO2 weaning down slowly   Objective   Blood pressure (!) 164/35, pulse 88, temperature 99.6 F (37.6 C), temperature source Rectal, resp. rate (!) 24, height _0  (1.575 m), weight 80.6 kg, SpO2 96 %.    Vent Mode: PRVC FiO2 (%):  [55 %-60 %] 55 % Set Rate:  [15 bmp] 15 bmp Vt Set:  [430 mL] 430 mL PEEP:  [10 cmH20] 10 cmH20 Plateau Pressure:  [19 cmH20-35 cmH20] 23 cmH20   Intake/Output Summary (Last 24 hours) at 04/14/2018 1200 Last data filed at 04/14/2018 1000 Gross per 24 hour  Intake 2507.3 ml  Output 300 ml  Net 2207.3 ml   Filed Weights   04/12/18 0500 04/13/18 0500 04/14/18 0500  Weight: 76.5 kg 82.1 kg 80.6 kg    Examination:  Gen:      No acute distress HEENT:  EOMI, sclera anicteric Neck:     No masses; no thyromegaly Lungs:    Clear to auscultation bilaterally; normal respiratory effort CV:         Regular rate and rhythm; no murmurs Abd:      + bowel sounds; soft, non-tender; no palpable masses, no distension Ext:    No edema; adequate peripheral perfusion Skin:      Warm and dry; no rash Neuro: Sedated, unresponsive.  Assessment & Plan:  Acute on Chronic Respiratory Failure with Hypoxemia - in setting of metapneumovirus, CAP and baseline pulmonary hypertension, sarcoidosis, Sjogren's syndrome P: Wean PEEP and FiO2 as tolerated. Scheduled for a trach on  11/11 by ENT Continue empiric steroids. Off amiodarone  Community Acquired PNA secondary to [place], ARDS -bilateral infiltrates on CXR, unchanged with diuresis/abx.  Consider amiodarone lung, sarcoid, hemoptysis  P: Continue cefepime. Follow cultures   Agitated Delirium  P: Klonopin PT  PRN versed  Fentanyl gtt   AKI P: Trend BMP / urinary output Replace electrolytes as indicated Avoid nephrotoxic agents, ensure adequate renal perfusion  Electrolyte Disturbances    -hypernatremia, hyperchloremia P: Follow electrolytes  Transaminitis - Possibly congestive hepatopathy vs shock liver from prior hypotension P: Trend LFT's   Anemia - suspect related to acute illness + dilution, no evidence of bleeding  - transfusion PRBC 11/6 P: Follow CBC Transfuse per ICU guidelines  Follow up H/H post transfusion   Atrial Fibrillation  -amiodarone, eliquis pre-admit P: SCD's  Off amio out of concern for ILD  Brachial Vein Segment Thrombosis  -home eliquis on hold due to AKI P: Heparin drip on hold for blood from ET tube.  Hyperglycemia P: Increase Lantus to 24 units SSI coverage  Gout P: Hold home allopurinol with AKI  Disposition / Summary of Today's Plan 04/14/18   Wean down PEEP, FiO2.     Diet: TF Pain: Anxiety/Delirium protocol (if indicated): RASS goal 0 to -1 VAP protocol (if indicated): yes DVT prophylaxis: SCD's.  GI prophylaxis: famotidine Hyperglycemia protocol: SSI Mobility: bed rest Code Status: Full code. Family Communication: Multiple discussions with the family and over the phone on 11/9.  They want everything done.  The patient is critically ill with multiple organ system failure and requires high complexity decision making for assessment and support, frequent evaluation and titration of therapies, advanced monitoring, review of radiographic studies and interpretation of complex data.   Critical Care Time devoted to patient care services, exclusive of separately billable procedures, described in this note is 35 minutes.   Marshell Garfinkel MD [place] [facility] and Critical [facility] Pager [phone] If no answer or after 3pm call: [phone] 04/14/2018, 12:00 PM

## 2018-04-15 ENCOUNTER — Encounter (HOSPITAL_COMMUNITY): Admission: EM | Disposition: E | Payer: Self-pay | Source: Home / Self Care | Attending: Pulmonary Disease

## 2018-04-15 ENCOUNTER — Encounter (HOSPITAL_COMMUNITY): Payer: Self-pay | Admitting: Certified Registered Nurse Anesthetist

## 2018-04-15 ENCOUNTER — Inpatient Hospital Stay (HOSPITAL_COMMUNITY): Payer: Medicare Other

## 2018-04-15 LAB — BASIC METABOLIC PANEL
Anion gap: 17 — ABNORMAL HIGH (ref 5–15)
BUN: 230 mg/dL — ABNORMAL HIGH (ref 8–23)
CALCIUM: 8.6 mg/dL — AB (ref 8.9–10.3)
CO2: 21 mmol/L — AB (ref 22–32)
CREATININE: 2.76 mg/dL — AB (ref 0.44–1.00)
Chloride: 104 mmol/L (ref 98–111)
GFR calc non Af Amer: 16 mL/min — ABNORMAL LOW (ref >60)
GFR, EST AFRICAN AMERICAN: 18 mL/min — AB (ref >60)
GLUCOSE: 297 mg/dL — AB (ref 70–99)
Potassium: 4.2 mmol/L (ref 3.5–5.1)
Sodium: 142 mmol/L (ref 135–145)

## 2018-04-15 LAB — POCT I-STAT 3, ART BLOOD GAS (G3+)
Acid-base deficit: 1 mmol/L (ref 0.0–2.0)
Bicarbonate: 24.1 mmol/L (ref 20.0–28.0)
O2 SAT: 95 %
PCO2 ART: 43.5 mmHg (ref 32.0–48.0)
PO2 ART: 81 mmHg — AB (ref 83.0–108.0)
Patient temperature: 37.8
TCO2: 25 mmol/L (ref 22–32)
pH, Arterial: 7.355 (ref 7.350–7.450)

## 2018-04-15 LAB — PHOSPHORUS: Phosphorus: 6.3 mg/dL — ABNORMAL HIGH (ref 2.5–4.6)

## 2018-04-15 LAB — CBC
HEMATOCRIT: 26.6 % — AB (ref 36.0–46.0)
Hemoglobin: 7.7 g/dL — ABNORMAL LOW (ref 12.0–15.0)
MCH: 27.1 pg (ref 26.0–34.0)
MCHC: 28.9 g/dL — AB (ref 30.0–36.0)
MCV: 93.7 fL (ref 80.0–100.0)
Platelets: 122 10*3/uL — ABNORMAL LOW (ref 150–400)
RBC: 2.84 MIL/uL — ABNORMAL LOW (ref 3.87–5.11)
RDW: 21.2 % — ABNORMAL HIGH (ref 11.5–15.5)
WBC: 11.3 10*3/uL — AB (ref 4.0–10.5)
nRBC: 2 % — ABNORMAL HIGH (ref 0.0–0.2)

## 2018-04-15 LAB — GLUCOSE, CAPILLARY
GLUCOSE-CAPILLARY: 265 mg/dL — AB (ref 70–99)
GLUCOSE-CAPILLARY: 268 mg/dL — AB (ref 70–99)
GLUCOSE-CAPILLARY: 270 mg/dL — AB (ref 70–99)
GLUCOSE-CAPILLARY: 273 mg/dL — AB (ref 70–99)
GLUCOSE-CAPILLARY: 281 mg/dL — AB (ref 70–99)
GLUCOSE-CAPILLARY: 290 mg/dL — AB (ref 70–99)
GLUCOSE-CAPILLARY: 298 mg/dL — AB (ref 70–99)
Glucose-Capillary: 258 mg/dL — ABNORMAL HIGH (ref 70–99)

## 2018-04-15 LAB — MAGNESIUM: Magnesium: 1.9 mg/dL (ref 1.7–2.4)

## 2018-04-15 SURGERY — CREATION, TRACHEOSTOMY
Anesthesia: General

## 2018-04-15 MED ORDER — FENTANYL CITRATE (PF) 250 MCG/5ML IJ SOLN
INTRAMUSCULAR | Status: AC
  Filled 2018-04-15: qty 5

## 2018-04-15 MED ORDER — COLLAGENASE 250 UNIT/GM EX OINT
TOPICAL_OINTMENT | Freq: Every day | CUTANEOUS | Status: DC
  Administered 2018-04-15: 12:00:00 via TOPICAL
  Administered 2018-04-16: 1 via TOPICAL
  Administered 2018-04-17: 11:00:00 via TOPICAL
  Filled 2018-04-15: qty 30

## 2018-04-15 MED ORDER — PROPOFOL 10 MG/ML IV BOLUS
INTRAVENOUS | Status: AC
  Filled 2018-04-15: qty 20

## 2018-04-15 MED ORDER — HEPARIN SODIUM (PORCINE) 5000 UNIT/ML IJ SOLN
5000.0000 [IU] | Freq: Three times a day (TID) | INTRAMUSCULAR | Status: AC
  Administered 2018-04-15 – 2018-04-16 (×4): 5000 [IU] via SUBCUTANEOUS
  Filled 2018-04-15 (×4): qty 1

## 2018-04-15 MED ORDER — METOPROLOL TARTRATE 5 MG/5ML IV SOLN
2.5000 mg | Freq: Four times a day (QID) | INTRAVENOUS | Status: DC
  Administered 2018-04-15 – 2018-04-17 (×9): 2.5 mg via INTRAVENOUS
  Filled 2018-04-15 (×9): qty 5

## 2018-04-15 MED ORDER — METHYLPREDNISOLONE SODIUM SUCC 40 MG IJ SOLR
40.0000 mg | Freq: Two times a day (BID) | INTRAMUSCULAR | Status: DC
  Administered 2018-04-15 – 2018-04-17 (×4): 40 mg via INTRAVENOUS
  Filled 2018-04-15 (×4): qty 1

## 2018-04-15 MED ORDER — FUROSEMIDE 10 MG/ML IJ SOLN
40.0000 mg | Freq: Four times a day (QID) | INTRAMUSCULAR | Status: AC
  Administered 2018-04-15 (×2): 40 mg via INTRAVENOUS
  Filled 2018-04-15 (×2): qty 4

## 2018-04-15 MED ORDER — MIDAZOLAM HCL 2 MG/2ML IJ SOLN
INTRAMUSCULAR | Status: AC
  Filled 2018-04-15: qty 2

## 2018-04-15 NOTE — Consult Note (Signed)
[place] Nurse wound follow-up consult note Re-assessment of sacral HAPI and  MDRI mucosal ulceration on underside of right side of upper lip from pressure of ET tube stabilizer. The lip area appears to be healed. Measurement: Previously noted deep tissue injury has evolved into unstageable.  Sacral area has 7cm x 8cm unstageable pressure injury; 100% tightly adhered eschar with small amt tan drainage and some odor. Dressing procedure/placement/frequency: Pt is critically ill with multiple systemic factors which can impair healing.  Begin Santyl for enzymatic debridement of nonviable tissue and hydrotherapy by physical therapy for debridemenmt.  Pt remains on air mattress to reduce pressure.  Discussed plan of care with family member at the bedside. [place] team will continue to assess weekly. Julien Girt MSN, RN, [place], [place], [place]

## 2018-04-15 NOTE — Progress Notes (Signed)
NAME:  Heather Mckay, MRN:  [id number], DOB:  09/13/1940, LOS: 71 ADMISSION DATE:  03/13/2018, CONSULTATION DATE:  10/21 REFERRING MD:  Sloan Leiter, CHIEF COMPLAINT:  Dyspnea   Brief History   77 y/o female with CHF, pulmonary sarcoidosis was admitted on 10/20 to the hospitalist service with dyspnea, myalgias, cough noted to have a positive RVP for metapneumovirus. Concern for LLL CAP.  Intubated with septic shock 10/22.      Past Medical History  Sarcoidosis, Pulmonary hypertension, CHF, HTN, HLD, GERD, Asthma, infasive ductal carcinoma, MGUS, OSA on CPAP and on home O2 Possible sjogrens with positive ANA 1:640, positive Sjogren's ab, positive double-stranded DNA (Sees rheumatologist at [facility]) [place] [facility] Events   10/20 Admit 10/21 PCCM consulted 10/22 Early AM intubated, in shock 10/25 Hgb 6.5, 1u pRBC ordered 11/04 Seroquel stopped for prolonged QTc 11/5 Bronch, Heparin started but then held for hemoptysis 11/7 Heparin restarted, empiric steroids 11/8 Started lasix for diuresis 11/9 lasix, heparin held for AKI and bleeding from ETT  Consults: date of consult/date signed off & final recs:  10/21 PCCM  11/6 ENT  Procedures (surgical and bedside):  10/22 ETT >   Significant Diagnostic Tests:  12/2016 TTE > RVSP 69 mmHb, LVEF 60-65%, mod LVH, normal left atrium 10/22 TTE >> 55-60%, no wall motion abnormalities, tricuspid valve thickening c/w rheumatic disease, moderate regurgitation, PA peak pressure 64 UE Duplex 10/22 >> positive for deep vein thrombosis of right brachial veins  CT chest.  04/11/2018-diffuse bilateral consolidation, small pleural effusion.  Mild emphysema, coronary artery calcification.  I have reviewed the images personally.  Micro Data:  RVP 10/20 >> positive for metapneumovirus BCx2 10/20 >>negative  Sputum 10/20 >>MRSA pos Tracheal aspirate 11/5 >>  BAL 11/5 >>  BAL AFB 11/5 >>  BAL Fungal 11/5 >>  Antimicrobials:  Ceftriaxone 10/20  >>10/24 Azithro 10/20 >>10/24 Zosyn/vanc x1 dose 10/21 Vanco (empiric) 11/6 >> 11/8 Cefepime (empiric) 11/6 >>   Subjective:  No acute events Tube feedings not held, no tracheostomy  Objective   Blood pressure (!) 180/65, pulse 96, temperature (!) 101.4 F (38.6 C), temperature source Core, resp. rate (!) 27, height _0  (1.575 m), weight 83.2 kg, SpO2 96 %.    Vent Mode: PRVC FiO2 (%):  [50 %-55 %] 50 % Set Rate:  [15 bmp] 15 bmp Vt Set:  [430 mL] 430 mL PEEP:  [10 cmH20] 10 cmH20 Plateau Pressure:  [21 cmH20-25 cmH20] 23 cmH20   Intake/Output Summary (Last 24 hours) at 04/24/2018 1026 Last data filed at 04/18/2018 0600 Gross per 24 hour  Intake 1176.71 ml  Output 2000 ml  Net -823.29 ml   Filed Weights   04/13/18 0500 04/14/18 0500 04/29/2018 0500  Weight: 82.1 kg 80.6 kg 83.2 kg    Examination:  General:  In bed on vent HENT: NCAT ETT in place PULM: Rhonchi/crackles bilaterally, vent supported breathing CV: RRR, no mgr GI: BS+, soft, nontender MSK: normal bulk and tone Neuro: sedated on vent    Resolved Hospital Problem list     Assessment & Plan:  ARDS from [place]: no significant improvement > decrease solumedrol to 68m IV q12h > trach held by Dr. MBlenda Nicelyon 11/11 because tube feedings were not held overnight > continue full vent support, wean PEEP/FiO2 for SaO2 > 90% > lasix today  Sarcoidosis, Sjogren's disease, ? Acute inflammatory pneumonitis in addition to metapneumovirus: unclear > will wean solumedrol to bid, stop this week  Pulmonary hypertension > diurese today  CKD  BUN 230 > not a dialysis candidate > wean down solumedrol > monitor UOP  Acute encephalopathy/ICU delirium > continue klonopin > fentanyl gtt > versed prn  Transaminitis due to congestive hepatopathy: improving > repeat LFT in AM  Hypernatremia > continue free water  Atrial Fib > hold amiodarone for concern for amio lung toxicity > solumedrol to  continue > start metoprolol 2.5 IV q6h > hold heparin with trach pending  Sacral wound > wound care per nursing routine   Disposition / Summary of Today's Plan 04/13/2018   Read above     Diet: Hold tube feeding 11/12 at midnight Pain/Anxiety/Delirium protocol (if indicated): PAD protocol, fentanyl infusion VAP protocol (if indicated): none DVT prophylaxis: sub q heparin until tracheostomy then back on heparin infusion GI prophylaxis: famotidine Hyperglycemia protocol: SSI Mobility: bed rest Code Status: full Family Communication: I updated her son today. I explained that she will not survive this illness. While there has been some transient improvements here and there with some of her many problems during this admission, overall she has really made no significant progress compared to when I first met her 3 weeks ago.  Given her age, multiple comorbid illnesses I see no likelihood of meaningful recovery.  I explained this to her son today who says "with our faith we believe she can survive".  I explained that I believe based on the information available to use any further attempts to prolong her life will cause ongoing suffering with little to no chance of survival.  Labs   CBC: Recent Labs  Lab 04/11/18 0412 04/12/18 0409 04/13/18 0541 04/14/18 0342 05/02/2018 0237  WBC 15.0* 12.5* 12.0* 13.3* 11.3*  HGB 7.8* 7.6* 7.3* 7.4* 7.7*  HCT 26.6* 24.7* 24.6* 23.6* 26.6*  MCV 92.7 90.5 90.8 88.4 93.7  PLT 132* 105* 105* 111* 122*    Basic Metabolic Panel: Recent Labs  Lab 04/11/18 0552 04/12/18 0409 04/13/18 0541 04/14/18 0342 05/01/2018 0237  NA 147* 142 142 143 142  K 3.2* 4.0 3.8 3.9 4.2  CL 107 101 102 104 104  CO2 _0 21*  GLUCOSE 129* 216* 232* 287* 297*  BUN 186* 186* 202* 213* 230*  CREATININE 2.47* 2.49* 2.68* 2.89* 2.76*  CALCIUM 8.8* 9.0 8.8* 8.8* 8.6*  MG  --  1.9 1.8 1.8 1.9  PHOS  --  5.6* 5.9* 5.3* 6.3*   GFR: Estimated Creatinine Clearance: 17.3  mL/min (A) (by C-G formula based on SCr of 2.76 mg/dL (H)). Recent Labs  Lab 04/11/18 1404 04/12/18 0409 04/13/18 0541 04/14/18 0342 04/26/2018 0237  PROCALCITON 6.71 6.31 5.93  --   --   WBC  --  12.5* 12.0* 13.3* 11.3*    Liver Function Tests: No results for input(s): AST, ALT, ALKPHOS, BILITOT, PROT, ALBUMIN in the last 168 hours. No results for input(s): LIPASE, AMYLASE in the last 168 hours. No results for input(s): AMMONIA in the last 168 hours.  ABG    Component Value Date/Time   PHART 7.355 04/05/2018 0442   PCO2ART 43.5 04/09/2018 0442   PO2ART 81.0 (L) 04/26/2018 0442   HCO3 24.1 04/16/2018 0442   TCO2 25 04/21/2018 0442   ACIDBASEDEF 1.0 04/10/2018 0442   O2SAT 95.0 04/14/2018 0442     Coagulation Profile: No results for input(s): INR, PROTIME in the last 168 hours.  Cardiac Enzymes: No results for input(s): CKTOTAL, CKMB, CKMBINDEX, TROPONINI in the last 168 hours.  HbA1C: No results found for: HGBA1C  CBG: Recent Labs  Lab 04/14/18 1952 05/04/2018 0109 05/02/2018 0407 04/19/2018 0722 04/24/2018 0959  GLUCAP 213* 268* 298* 290* 281*     Critical care time: 45 minutes    Roselie Awkward, MD [place] [facility] Pager: [phone] Cell: [phone] After 3pm or if no response, call [phone]

## 2018-04-15 NOTE — H&P (Deleted)
  The surgical history remains accurate and without interval change. The condition still exists which makes the procedure necessary. The patient and/or family is aware of their condition and has been informed of the risks and benefits of surgery, as well as alternatives. All parties have elected to proceed with surgery.   The ventilator settings have improved significantly- FiO2 50% and PEEP 10.   Surgical plan: tracheostomy

## 2018-04-15 NOTE — Progress Notes (Addendum)
   Physical Therapy Wound Treatment Patient Details  Name: Heather Mckay MRN: [id number] Date of Birth: August 02, 1940  Today's Date: 04/09/2018 Time: 1212-1242 Time Calculation (min): 30 min  Subjective  Subjective: Pt intubated Patient and Family Stated Goals: not stated Prior Treatments: none  Pain Score:    Wound Assessment  Pressure Injury 04/10/18 Unstageable - Full thickness tissue loss in which the base of the ulcer is covered by slough (yellow, tan, gray, green or Wedig) and/or eschar (tan, Strozier or black) in the wound bed. fist noted in flowsheet as blister on 10/29. S (Active)  Dressing Type Foam;Barrier Film (skin prep);Moist to dry 04/05/2018 12:15 PM  Dressing Changed;Clean;Dry;Intact 05/03/2018 12:15 PM  Dressing Change Frequency Daily 04/20/2018 12:15 PM  State of Healing Eschar 04/24/2018 12:15 PM  Site / Wound Assessment Black;Red 04/14/2018 12:15 PM  % Wound base Red or Granulating 5% 04/09/2018 12:15 PM  % Wound base Yellow/Fibrinous Exudate 0% 05/01/2018 12:15 PM  % Wound base Black/Eschar 95% 04/06/2018 12:15 PM  % Wound base Other/Granulation Tissue (Comment) 0% 04/12/2018 12:15 PM  Wound Length (cm) 7.5 cm 04/19/2018 12:15 PM  Wound Width (cm) 10 cm 04/25/2018 12:15 PM  Wound Depth (cm) 0.1 cm 04/19/2018 12:15 PM  Wound Surface Area (cm^2) 75 cm^2 04/29/2018 12:15 PM  Wound Volume (cm^3) 7.5 cm^3 04/29/2018 12:15 PM  Tunneling (cm) 0 04/13/2018  4:00 PM  Undermining (cm) 0 04/13/2018  4:00 PM  Margins Unattached edges (unapproximated) 04/23/2018 12:15 PM  Drainage Amount Minimal 04/29/2018 12:15 PM  Drainage Description Purulent 04/29/2018 12:15 PM  Treatment Hydrotherapy (Pulse lavage);Packing (Saline gauze);Other (Comment) 04/20/2018 12:15 PM  Santyl applied to wound bed prior to applying dressing.      Hydrotherapy Pulsed lavage therapy - wound location: sacrum Pulsed Lavage with Suction (psi): 12 psi Pulsed Lavage with Suction - Normal Saline Used:  1000 mL Pulsed Lavage Tip: Tip with splash shield   Wound Assessment and Plan  Wound Therapy - Assess/Plan/Recommendations Wound Therapy - Clinical Statement: Pt presents to hydrotherapy with unstageable sacral wound covered by tight necrotic tissue. Can benefit from hydrotherapy to assist with removal of necrotic tissue and to reduce bioburden.  Wound Therapy - Functional Problem List: decr mobility Factors Delaying/Impairing Wound Healing: Immobility;Multiple medical problems;Polypharmacy;Incontinence Hydrotherapy Plan: Debridement;Dressing change;Patient/family education;Pulsatile lavage with suction Wound Therapy - Frequency: 6X / week Wound Therapy - Follow Up Recommendations: (LTACH) Wound Plan: see above  Wound Therapy Goals- Improve the function of patient's integumentary system by progressing the wound(s) through the phases of wound healing (inflammation - proliferation - remodeling) by: Decrease Necrotic Tissue to: 90 Decrease Necrotic Tissue - Progress: Goal set today Increase Granulation Tissue to: 10 Increase Granulation Tissue - Progress: Goal set today Goals/treatment plan/discharge plan were made with and agreed upon by patient/family: No, Patient unable to participate in goals/treatment/discharge plan and family unavailable Time For Goal Achievement: 7 days Wound Therapy - Potential for Goals: Fair  Goals will be updated until maximal potential achieved or discharge criteria met.  Discharge criteria: when goals achieved, discharge from hospital, MD decision/surgical intervention, no progress towards goals, refusal/missing three consecutive treatments without notification or medical reason.  GP     Heather Mckay 05/03/2018, 1:57 PM Heather Mckay Pager [phone] Office [phone]

## 2018-04-15 NOTE — Care Plan (Signed)
ENT Plan of Care Note  Tracheostomy today cancelled 2/2 tube feeds still on. Will try for Wednesday morning, 2018-04-28. Currently booked for 12:30pm.   Discussed surgery with son, Jeneen Rinks, on the phone and got verbal consent on 04/12/18.   Please make patient NPO on Tuesday night at midnight.  Hold heparin at midnight Tuesday night (if applicable) Continue working on vent wean.     Helayne Seminole, MD

## 2018-04-16 ENCOUNTER — Inpatient Hospital Stay (HOSPITAL_COMMUNITY): Payer: Medicare Other

## 2018-04-16 LAB — CBC WITH DIFFERENTIAL/PLATELET
Basophils Absolute: 0 10*3/uL (ref 0.0–0.1)
Basophils Relative: 0 %
Eosinophils Absolute: 0 10*3/uL (ref 0.0–0.5)
Eosinophils Relative: 0 %
HEMATOCRIT: 24.7 % — AB (ref 36.0–46.0)
Hemoglobin: 7.5 g/dL — ABNORMAL LOW (ref 12.0–15.0)
Lymphocytes Relative: 0 %
Lymphs Abs: 0 10*3/uL — ABNORMAL LOW (ref 0.7–4.0)
MCH: 27.3 pg (ref 26.0–34.0)
MCHC: 30.4 g/dL (ref 30.0–36.0)
MCV: 89.8 fL (ref 80.0–100.0)
MONO ABS: 0.1 10*3/uL (ref 0.1–1.0)
Monocytes Relative: 1 %
NEUTROS ABS: 14.3 10*3/uL — AB (ref 1.7–7.7)
NEUTROS PCT: 99 %
Platelets: 132 10*3/uL — ABNORMAL LOW (ref 150–400)
RBC: 2.75 MIL/uL — ABNORMAL LOW (ref 3.87–5.11)
RDW: 21.7 % — ABNORMAL HIGH (ref 11.5–15.5)
WBC: 14.4 10*3/uL — ABNORMAL HIGH (ref 4.0–10.5)
nRBC: 2.2 % — ABNORMAL HIGH (ref 0.0–0.2)
nRBC: 3 /100 WBC — ABNORMAL HIGH

## 2018-04-16 LAB — COMPREHENSIVE METABOLIC PANEL
ALT: 172 U/L — ABNORMAL HIGH (ref 0–44)
AST: 85 U/L — ABNORMAL HIGH (ref 15–41)
Albumin: 1.9 g/dL — ABNORMAL LOW (ref 3.5–5.0)
Alkaline Phosphatase: 169 U/L — ABNORMAL HIGH (ref 38–126)
Anion gap: 16 — ABNORMAL HIGH (ref 5–15)
BILIRUBIN TOTAL: 1.7 mg/dL — AB (ref 0.3–1.2)
BUN: 239 mg/dL — ABNORMAL HIGH (ref 8–23)
CO2: 23 mmol/L (ref 22–32)
Calcium: 8.7 mg/dL — ABNORMAL LOW (ref 8.9–10.3)
Chloride: 105 mmol/L (ref 98–111)
Creatinine, Ser: 2.93 mg/dL — ABNORMAL HIGH (ref 0.44–1.00)
GFR calc Af Amer: 17 mL/min — ABNORMAL LOW (ref >60)
GFR, EST NON AFRICAN AMERICAN: 15 mL/min — AB (ref >60)
Glucose, Bld: 288 mg/dL — ABNORMAL HIGH (ref 70–99)
POTASSIUM: 3.5 mmol/L (ref 3.5–5.1)
Sodium: 144 mmol/L (ref 135–145)
TOTAL PROTEIN: 6.5 g/dL (ref 6.5–8.1)

## 2018-04-16 LAB — GLUCOSE, CAPILLARY
GLUCOSE-CAPILLARY: 249 mg/dL — AB (ref 70–99)
Glucose-Capillary: 236 mg/dL — ABNORMAL HIGH (ref 70–99)
Glucose-Capillary: 251 mg/dL — ABNORMAL HIGH (ref 70–99)
Glucose-Capillary: 260 mg/dL — ABNORMAL HIGH (ref 70–99)
Glucose-Capillary: 263 mg/dL — ABNORMAL HIGH (ref 70–99)
Glucose-Capillary: 277 mg/dL — ABNORMAL HIGH (ref 70–99)

## 2018-04-16 MED ORDER — POLYETHYLENE GLYCOL 3350 17 G PO PACK
17.0000 g | PACK | Freq: Every day | ORAL | Status: DC
  Administered 2018-04-16: 17 g via ORAL
  Filled 2018-04-16: qty 1

## 2018-04-16 MED ORDER — VITAL HIGH PROTEIN PO LIQD
1000.0000 mL | ORAL | Status: DC
  Administered 2018-04-16: 1000 mL

## 2018-04-16 MED ORDER — BISACODYL 10 MG RE SUPP: 10.0000 mg | Freq: Every day | RECTAL | Status: DC | PRN

## 2018-04-16 NOTE — Progress Notes (Addendum)
NAME:  Heather Mckay, MRN:  [id number], DOB:  Feb 22, 1941, LOS: 26 ADMISSION DATE:  03/28/2018, CONSULTATION DATE:  10/21 REFERRING MD:  Sloan Leiter, CHIEF COMPLAINT:  Dyspnea   Brief History   77 y/o female with CHF, pulmonary sarcoidosis was admitted on 10/20 to the hospitalist service with dyspnea, myalgias, cough noted to have a positive RVP for metapneumovirus. Concern for LLL CAP.  Intubated with septic shock 10/22.      Past Medical History  Sarcoidosis, Pulmonary hypertension, CHF, HTN, HLD, GERD, Asthma, infasive ductal carcinoma, MGUS, OSA on CPAP and on home O2 Possible sjogrens with positive ANA 1:640, positive Sjogren's ab, positive double-stranded DNA (Sees rheumatologist at [facility]) [facility] Events   10/20 Admit 10/21 PCCM consulted 10/22 Early AM intubated, in shock 10/25 Hgb 6.5, 1u pRBC ordered 11/04 Seroquel stopped for prolonged QTc 11/5 Bronch, Heparin started but then held for hemoptysis 11/7 Heparin restarted, empiric steroids 11/8 Started lasix for diuresis 11/9 lasix, heparin held for AKI and bleeding from ETT 11/11: Rising BUN and creatinine, goals of care discussed with son, continues to hold out for miracle, wants aggressive care, tracheostomy placed on hold as patient had tube feeds 11/12: spiking fever, now on cooling blanket. WBC climbing. Sputum culture sent will also send blood.   Consults: date of consult/date signed off & final recs:  10/21 PCCM  11/6 ENT  Procedures (surgical and bedside):  10/22 ETT >   Significant Diagnostic Tests:  12/2016 TTE > RVSP 69 mmHb, LVEF 60-65%, mod LVH, normal left atrium 10/22 TTE >> 55-60%, no wall motion abnormalities, tricuspid valve thickening c/w rheumatic disease, moderate regurgitation, PA peak pressure 64 UE Duplex 10/22 >> positive for deep vein thrombosis of right brachial veins  CT chest.  04/11/2018-diffuse bilateral consolidation, small pleural effusion.  Mild emphysema, coronary artery  calcification.  I have reviewed the images personally.  Micro Data:  RVP 10/20 >> positive for metapneumovirus BCx2 10/20 >>negative  Sputum 10/20 >>MRSA pos Tracheal aspirate 11/5 >>  negative BAL 11/5 >>  negative BAL AFB 11/5 >>  negative BAL Fungal 11/5 >> negative Respiratory culture 11/12>>>  Antimicrobials:  Ceftriaxone 10/20 >>10/24 Azithro 10/20 >>10/24 Zosyn/vanc x1 dose 10/21 Vanco (empiric) 11/6 >> 11/8 Cefepime (empiric) 11/6 >> off  Subjective:  Spiking fever yesterday No improvement.  WBC climbing  Objective   Blood pressure (Abnormal) 128/49, pulse 80, temperature 98.2 F (36.8 C), temperature source Core, resp. rate 18, height _0  (1.575 m), weight 84 kg, SpO2 98 %.    Vent Mode: PRVC FiO2 (%):  [50 %] 50 % Set Rate:  [15 bmp] 15 bmp Vt Set:  [430 mL] 430 mL PEEP:  [8 cmH20-10 cmH20] 8 cmH20 Plateau Pressure:  [18 cmH20-25 cmH20] 22 cmH20   Intake/Output Summary (Last 24 hours) at 04/16/2018 0945 Last data filed at 04/16/2018 0932 Gross per 24 hour  Intake 1206.4 ml  Output 2141 ml  Net -934.6 ml   Filed Weights   04/14/18 0500 04/27/2018 0500 04/16/18 0500  Weight: 80.6 kg 83.2 kg 84 kg    Examination: General: Critically ill 77 year old female currently remains on full ventilator support with high FiO2 HEENT: Normocephalic atraumatic orally intubated Pulmonary: Coarse scattered rhonchi mechanically assisted breath Cardiac: Regular rate and rhythm Abdomen: Soft, nontender, no organomegaly Extremities: Warm and dry Derm: Large unstageable sacral ulcer currently getting hydrotherapy Neuro: Sedated, moves leg and grimaces to pain  GU: Concentrated yellow   Resolved Hospital Problem list     Assessment &  Plan:   Acute hypoxic respiratory failure, ARDS from [place]:  no significant improvement Portable chest x-ray personally reviewed on 11/12:Endotracheal tubes in satisfactory position, aeration looks a little worse compared  to prior film with increased patchy infiltrates bilaterally superimposed on right greater than left airspace disease -rising wbc -fever spiking ? HCAP Plan Continue Solu-Medrol at current dosing 40 mg every 12, decreased to 20 mg every 12 on 11/13 Continue to wean PEEP/FiO2 for saturations greater than 90% assess daily Lasix, holding today as BUN continues to climb  Sarcoidosis, Sjogren's disease, ? Acute inflammatory pneumonitis in addition to metapneumovirus: unclear Plan Continue to wean Solu-Medrol, plan to discontinue by weeks end  Pulmonary hypertension Plan Continue daily assessment for diuresis  Acute on chronic renal failure  BUN 239, creatinine 2.93 > not a dialysis candidate Plan Wean Solu-Medrol Serial chemistries Strict intake output  Acute encephalopathy/ICU delirium Plan No change in Klonopin Continue fentanyl drip and PRN Versed Once tracheostomy placed we can can work on stopping fentanyl infusion   Transaminitis due to congestive hepatopathy -LFTs actually increased a little since last check, total bilirubin climbing as well -has had prior cholecystectomy last [place] 10/21 was w/out biliary dilatation  Plan Continue to trend more closely May need to consider repeat US abd   Fluid and electrolyte imbalance: Hypernatremia -Sodium is normalized with free water replacement Plan Continue maintenance free water replacement  Atrial Fib Amiodarone on hold  for concern for amio lung toxicity Plan Continue Lopressor 2.5 mg IV every 6 hours Holding heparin for pending trach   Sacral wound Plan Continue wound care per wound ostomy nurse  Constipation  Plan Add miralax   Best practice   Diet: Hold tube feeding 11/12 at midnight Pain/Anxiety/Delirium protocol (if indicated): PAD protocol, fentanyl infusion VAP protocol (if indicated): ordered DVT prophylaxis: sub q heparin until tracheostomy then back on heparin infusion GI prophylaxis:  famotidine Hyperglycemia protocol: SSI Mobility: bed rest Code Status: full Family Communication: - son updated 11/11:  explained that she will not survive this illness. While there has been some transient improvements here and there with some of her many problems during this admission, overall she has really made no significant progress compared to when I first met her 3 weeks ago.  Given her age, multiple comorbid illnesses I see no likelihood of meaningful recovery Son today who says "with our faith we believe she can survive".  I explained that I believe based on the information available to use any further attempts to prolong her life will cause ongoing suffering with little to no chance of survival.   Critical care time: 32 min     Erick Colace ACNP-BC [place] Pager # [phone] OR # [phone] if no answer

## 2018-04-16 NOTE — Progress Notes (Signed)
Nutrition Follow-up  DOCUMENTATION CODES:   Not applicable  INTERVENTION:   Tube Feeding:  Change Vital High Protein to rate of 55 ml/hr  Provides 1320 kcals, 116 g of protein and 1109 mL of free water  NUTRITION DIAGNOSIS:   Inadequate oral intake related to acute illness as evidenced by NPO status.  Being addressed via TF   GOAL:   Patient will meet greater than or equal to 90% of their needs  Met  MONITOR:   TF tolerance, Labs, Weight trends, Vent status  REASON FOR ASSESSMENT:   Consult Enteral/tube feeding initiation and management  ASSESSMENT:   77 yo female admitted with acute on chronic respiratory failure requiring intubation on 10/22 with CAP, pulmonary sarcoidosis and OSA; pt also with AKI, in shock, likely cardiogenic. Additional  PMH includes CHF, pulmonary HTN, HLD, GERD, invasive ductal carcinoma  10/20 Admit 10/22 Intubated 11/12 Febrile, cooling blanket, WBC climbing  Patient is currently intubated on ventilator support; plan for trach on 11/13 MV: 9 L/min Temp (24hrs), Avg:98.9 F (37.2 C), Min:98.2 F (36.8 C), Max:99.2 F (37.3 C)  BUN worsening, not dialysis candidate. Noted diuresis on hold  Hypernatremia has improved with free water   Weight up significantly since admission; admission wt of 67 kg, current wt 84 kg. Net +8 L since admission  DTI sacrum evolved into unstageable. Pressure wound to lip healed  Labs: CBGs 249-298; BUN 239 (H), Creatinine 2.93, phosphorus 6.3 (H) Meds: ferrous sulfate, solumedrol, ss novolog, lantus  Diet Order:   Diet Order            Diet NPO time specified  Diet effective now              EDUCATION NEEDS:   Not appropriate for education at this time  Skin:  Skin Assessment: Skin Integrity Issues: Skin Integrity Issues:: Unstageable, DTI DTI: n/a Stage II: n/a Unstageable: sacrum, rt heel  Last BM:  11/11  Height:   Ht Readings from Last 1 Encounters:  03/26/18 5' 2"  (1.575 m)     Weight:   Wt Readings from Last 1 Encounters:  04/16/18 84 kg    Ideal Body Weight:  50 kg  BMI:  Body mass index is 33.87 kg/m.  Estimated Nutritional Needs:   Kcal:  1370 kcals   Protein:  100-120 g   Fluid:  >/= 1.4 L   Kerman Passey MS, RD, LDN, CNSC [phone] Pager  [phone] Weekend/On-Call Pager

## 2018-04-16 NOTE — Care Management Note (Signed)
Case Management Note Marvetta Gibbons RN,BSN Transitions of Care Unit 16M - RN Case Manager [phone]  Patient Details  Name: Heather Mckay MRN: [id number] Date of Birth: 1941-04-15  Subjective/Objective:   Pt admitted with sepsis, early 10/22 intubated for respiratory failure/shock- + for meta-pneumovirus. Currently on vent 10/29                 Action/Plan: PTA pt lived at home, on home 02. CM will follow for transition of care needs. Prognosis guarded anticipate slow recovery.   Expected Discharge Date:                  Expected Discharge Plan:     In-House Referral:     Discharge planning Services  CM Consult  Post Acute Care Choice:    Choice offered to:     DME Arranged:    DME Agency:     HH Arranged:    HH Agency:     Status of Service:  In process, will continue to follow  If discussed at Long Length of Stay Meetings, dates discussed:  ongoing  Discharge Disposition:   Additional Comments:  04/16/18- 1315- Marvetta Gibbons RN, CM- CM continues to follow- pt remains on vent, patient remains critically ill- family continues to want aggressive care- plan for Trach at some point this week. PT eval pending.   Dawayne Patricia, RN 04/16/2018, 1:20 PM

## 2018-04-16 NOTE — Progress Notes (Signed)
Physical Therapy Wound Treatment Patient Details  Name: Heather Mckay MRN: [id number] Date of Birth: Aug 05, 1940  Today's Date: 04/16/2018 Time: 6468-0321 Time Calculation (min): 23 min  Subjective  Subjective: Pt intubated Patient and Family Stated Goals: not stated Prior Treatments: none  Pain Score:  Sedated on vent. No sign of pain or discomfort.  Wound Assessment  Pressure Injury 04/10/18 Unstageable - Full thickness tissue loss in which the base of the ulcer is covered by slough (yellow, tan, gray, green or Heather Mckay) and/or eschar (tan, Heather Mckay or black) in the wound bed. fist noted in flowsheet as blister on 10/29. S (Active)  Dressing Type Foam;Barrier Film (skin prep);Moist to dry 04/16/2018 12:10 PM  Dressing Changed;Clean;Dry;Intact 04/16/2018 12:10 PM  Dressing Change Frequency Daily 04/16/2018 12:10 PM  State of Healing Eschar 04/16/2018 12:10 PM  Site / Wound Assessment Black;Red 04/16/2018 12:10 PM  % Wound base Red or Granulating 5% 04/16/2018 12:10 PM  % Wound base Yellow/Fibrinous Exudate 0% 04/16/2018 12:10 PM  % Wound base Black/Eschar 95% 04/16/2018 12:10 PM  % Wound base Other/Granulation Tissue (Comment) 0% 04/16/2018 12:10 PM  Peri-wound Assessment Intact 04/16/2018 10:11 AM  Wound Length (cm) 7.5 cm 04/08/2018 12:15 PM  Wound Width (cm) 10 cm 04/08/2018 12:15 PM  Wound Depth (cm) 0.1 cm 04/05/2018 12:15 PM  Wound Surface Area (cm^2) 75 cm^2 05/01/2018 12:15 PM  Wound Volume (cm^3) 7.5 cm^3 04/23/2018 12:15 PM  Tunneling (cm) 0 04/13/2018  4:00 PM  Undermining (cm) 0 04/13/2018  4:00 PM  Margins Unattached edges (unapproximated) 04/16/2018 12:10 PM  Drainage Amount Minimal 04/16/2018 12:10 PM  Drainage Description Purulent 04/16/2018 12:10 PM  Treatment Debridement (Selective);Hydrotherapy (Pulse lavage);Packing (Saline gauze) 04/16/2018 12:10 PM      Hydrotherapy Pulsed lavage therapy - wound location: sacrum Pulsed Lavage with Suction (psi): 12 psi Pulsed  Lavage with Suction - Normal Saline Used: 1000 mL Pulsed Lavage Tip: Tip with splash shield Selective Debridement Selective Debridement - Location: sacrum Selective Debridement - Tools Used: Forceps;Scalpel Selective Debridement - Tissue Removed: minimal black necrotic tissue. Crosshatched tough eschar with scalpel   Wound Assessment and Plan  Wound Therapy - Assess/Plan/Recommendations Wound Therapy - Clinical Statement: Eschar covering wound remains very tough. Continue hydrotherapy to loosen necrotic tissue. Wound Therapy - Functional Problem List: decr mobility Factors Delaying/Impairing Wound Healing: Immobility;Multiple medical problems;Polypharmacy;Incontinence Hydrotherapy Plan: Debridement;Dressing change;Patient/family education;Pulsatile lavage with suction Wound Therapy - Frequency: 6X / week Wound Therapy - Follow Up Recommendations: (LTACH) Wound Plan: see above  Wound Therapy Goals- Improve the function of patient's integumentary system by progressing the wound(s) through the phases of wound healing (inflammation - proliferation - remodeling) by: Decrease Necrotic Tissue to: 90 Decrease Necrotic Tissue - Progress: Progressing toward goal Increase Granulation Tissue to: 10 Increase Granulation Tissue - Progress: Progressing toward goal  Goals will be updated until maximal potential achieved or discharge criteria met.  Discharge criteria: when goals achieved, discharge from hospital, MD decision/surgical intervention, no progress towards goals, refusal/missing three consecutive treatments without notification or medical reason.  GP     Shary Decamp Heather Mckay 04/16/2018, 12:15 PM Heather Mckay Pager [phone] Office [phone]

## 2018-04-17 ENCOUNTER — Encounter (HOSPITAL_COMMUNITY): Admission: EM | Disposition: E | Payer: Self-pay | Source: Home / Self Care | Attending: Pulmonary Disease

## 2018-04-17 ENCOUNTER — Inpatient Hospital Stay (HOSPITAL_COMMUNITY): Payer: Medicare Other

## 2018-04-17 LAB — CBC
HCT: 24.8 % — ABNORMAL LOW (ref 36.0–46.0)
Hemoglobin: 7.4 g/dL — ABNORMAL LOW (ref 12.0–15.0)
MCH: 26.8 pg (ref 26.0–34.0)
MCHC: 29.8 g/dL — AB (ref 30.0–36.0)
MCV: 89.9 fL (ref 80.0–100.0)
PLATELETS: 147 10*3/uL — AB (ref 150–400)
RBC: 2.76 MIL/uL — ABNORMAL LOW (ref 3.87–5.11)
RDW: 22.3 % — ABNORMAL HIGH (ref 11.5–15.5)
WBC: 16.2 10*3/uL — ABNORMAL HIGH (ref 4.0–10.5)
nRBC: 2.5 % — ABNORMAL HIGH (ref 0.0–0.2)

## 2018-04-17 LAB — COMPREHENSIVE METABOLIC PANEL
ALT: 155 U/L — ABNORMAL HIGH (ref 0–44)
ANION GAP: 15 (ref 5–15)
AST: 89 U/L — ABNORMAL HIGH (ref 15–41)
Albumin: 1.9 g/dL — ABNORMAL LOW (ref 3.5–5.0)
Alkaline Phosphatase: 181 U/L — ABNORMAL HIGH (ref 38–126)
BUN: 252 mg/dL — AB (ref 8–23)
CO2: 24 mmol/L (ref 22–32)
Calcium: 8.8 mg/dL — ABNORMAL LOW (ref 8.9–10.3)
Chloride: 106 mmol/L (ref 98–111)
Creatinine, Ser: 2.88 mg/dL — ABNORMAL HIGH (ref 0.44–1.00)
GFR calc Af Amer: 17 mL/min — ABNORMAL LOW (ref >60)
GFR calc non Af Amer: 15 mL/min — ABNORMAL LOW (ref >60)
GLUCOSE: 250 mg/dL — AB (ref 70–99)
POTASSIUM: 4.8 mmol/L (ref 3.5–5.1)
SODIUM: 145 mmol/L (ref 135–145)
Total Bilirubin: 1.8 mg/dL — ABNORMAL HIGH (ref 0.3–1.2)
Total Protein: 6.4 g/dL — ABNORMAL LOW (ref 6.5–8.1)

## 2018-04-17 LAB — POCT I-STAT 3, ART BLOOD GAS (G3+)
Acid-base deficit: 5 mmol/L — ABNORMAL HIGH (ref 0.0–2.0)
Bicarbonate: 23.4 mmol/L (ref 20.0–28.0)
O2 SAT: 95 %
PCO2 ART: 65.1 mmHg — AB (ref 32.0–48.0)
PH ART: 7.168 — AB (ref 7.350–7.450)
PO2 ART: 98 mmHg (ref 83.0–108.0)
Patient temperature: 100
TCO2: 25 mmol/L (ref 22–32)

## 2018-04-17 LAB — GLUCOSE, CAPILLARY
GLUCOSE-CAPILLARY: 145 mg/dL — AB (ref 70–99)
Glucose-Capillary: 214 mg/dL — ABNORMAL HIGH (ref 70–99)
Glucose-Capillary: 179 mg/dL — ABNORMAL HIGH (ref 70–99)
Glucose-Capillary: 116 mg/dL — ABNORMAL HIGH (ref 70–99)

## 2018-04-17 SURGERY — CREATION, TRACHEOSTOMY
Anesthesia: General

## 2018-04-17 MED ORDER — INSULIN ASPART 100 UNIT/ML ~~LOC~~ SOLN
4.0000 [IU] | SUBCUTANEOUS | Status: DC
  Administered 2018-04-17: 4 [IU] via SUBCUTANEOUS

## 2018-04-17 MED ORDER — NOREPINEPHRINE 4 MG/250ML-% IV SOLN
INTRAVENOUS | Status: AC
  Filled 2018-04-17: qty 250

## 2018-04-17 MED ORDER — SODIUM CHLORIDE 0.9 % IV SOLN: 1.0000 g | INTRAVENOUS | Status: DC

## 2018-04-17 MED ORDER — DEXTROSE 5 % IV SOLN
INTRAVENOUS | Status: DC
  Administered 2018-04-17: 16:00:00 via INTRAVENOUS

## 2018-04-17 MED ORDER — VANCOMYCIN HCL 10 G IV SOLR
1500.0000 mg | Freq: Once | INTRAVENOUS | Status: AC
  Administered 2018-04-17: 1500 mg via INTRAVENOUS
  Filled 2018-04-17: qty 1500

## 2018-04-17 MED ORDER — VANCOMYCIN HCL IN DEXTROSE 750-5 MG/150ML-% IV SOLN: 750.0000 mg | INTRAVENOUS | Status: DC

## 2018-04-17 MED ORDER — SODIUM CHLORIDE 0.9 % IV SOLN
1.0000 g | Freq: Once | INTRAVENOUS | Status: DC
  Filled 2018-04-17: qty 1

## 2018-04-17 MED FILL — Medication: Qty: 1 | Status: AC

## 2018-04-18 LAB — CULTURE, RESPIRATORY: GRAM STAIN: NONE SEEN

## 2018-04-18 LAB — CULTURE, RESPIRATORY W GRAM STAIN

## 2018-04-19 SURGERY — CREATION, TRACHEOSTOMY
Anesthesia: General

## 2018-04-24 ENCOUNTER — Telehealth: Payer: Self-pay

## 2018-04-24 NOTE — Telephone Encounter (Signed)
On 04/24/18 I received a d[facility]c from [facility] ([facility]) DC is for burial. Patient is a patient of Doctor McQuaid.  DC will be taken to Pulmonary Unit for signature.  On 04/25/18 I received the d/c back from Doctor McQuaid. I got the d/c ready and called the funeral home to let them know the d/c is ready for pickup.

## 2018-05-05 NOTE — Progress Notes (Signed)
[facility]  Worsening hypoxemia with escalating doses of PEEP, FiO2 RT noted thick secretions on bag lavaage  Plan Bronchoscopy now Rocuronium now for better vent synchrony  Family updated Jeneen Rinks), he is aware  Roselie Awkward, MD [place] [facility] Pager: [phone] Cell: [phone] If no response, call [phone]

## 2018-05-05 NOTE — Progress Notes (Signed)
Desaturating. Still having fever, white cell climbing Spoke with ENT, tentatively delaying tracheostomy until Friday We will treat empirically for hcap in the meantime  Resume tube feeds  Erick Colace ACNP-BC [place] Pager # [phone] OR # [phone] if no answer

## 2018-05-05 NOTE — Code Documentation (Signed)
CODE BLUE NOTE  Patient Name: Heather Mckay   MRN: [id number]   Date of Birth/ Sex: April 10, 1941 , female      Admission Date: 03/22/2018  Attending Provider: Juanito Doom, MD  Primary Diagnosis: Sepsis due to pneumonia [facility])    Indication: Pt was in her usual state of health until this PM, when she was noted to be unresoponsive. Code blue was subsequently called. At the time of arrival on scene, ACLS protocol was underway.    Technical Description:  - CPR performance duration:  3 minutes  - Was defibrillation or cardioversion used? No   - Was external pacer placed? No  - Was patient intubated pre/post CPR? Yes    Medications Administered: Y = Yes; Blank = No Amiodarone    Atropine    Calcium    Epinephrine  Y  Lidocaine    Magnesium    Norepinephrine    Phenylephrine    Sodium bicarbonate    Vasopressin      Post CPR evaluation:  - Final Status - Was patient successfully resuscitated ? Yes - What is current rhythm? sinus - What is current hemodynamic status? stable   Miscellaneous Information:  - Labs sent, including: ABG  - Primary team notified?  Yes  - Family Notified? Yes  - Additional notes/ transfer status: Remain in 77M ICU        Wilber Oliphant, MD  2018-05-05, 4:59 PM

## 2018-05-05 NOTE — Progress Notes (Signed)
This RN informed Heather Mckay about pt having desaturation issues despite her vent changes of increased O2 to 100% and increased PEEP to 10.  Will continue to monitor.  Irven [place], RN

## 2018-05-05 NOTE — Progress Notes (Signed)
Physical Therapy Wound Treatment Patient Details  Name: Heather Mckay MRN: [id number] Date of Birth: 01-10-1941  Today's Date: 04/29/2018 Time: 6433-2951 Time Calculation (min): 31 min  Subjective  Subjective: Pt intubated Patient and Family Stated Goals: not stated Prior Treatments: none  Pain Score:  Awake on vent but no signs of pain.   Wound Assessment  Pressure Injury 04/10/18 Unstageable - Full thickness tissue loss in which the base of the ulcer is covered by slough (yellow, tan, gray, green or [place]) and/or eschar (tan, [place] or black) in the wound bed. fist noted in flowsheet as blister on 10/29. S (Active)  Dressing Type Foam;Barrier Film (skin prep);Moist to dry 29-Apr-2018  9:43 AM  Dressing Changed;Clean;Dry;Intact 04-29-18  9:43 AM  Dressing Change Frequency Daily 29-Apr-2018  9:43 AM  State of Healing Eschar 04-29-2018  9:43 AM  Site / Wound Assessment Black;Red Apr 29, 2018  9:43 AM  % Wound base Red or Granulating 5% 04/29/18  9:43 AM  % Wound base Yellow/Fibrinous Exudate 0% April 29, 2018  9:43 AM  % Wound base Black/Eschar 95% April 29, 2018  9:43 AM  % Wound base Other/Granulation Tissue (Comment) 0% 2018-04-29  9:43 AM  Peri-wound Assessment Intact 2018/04/29  9:43 AM  Wound Length (cm) 7.5 cm 04/05/2018 12:15 PM  Wound Width (cm) 10 cm 04/16/2018 12:15 PM  Wound Depth (cm) 0.1 cm 04/28/2018 12:15 PM  Wound Surface Area (cm^2) 75 cm^2 04/13/2018 12:15 PM  Wound Volume (cm^3) 7.5 cm^3 04/24/2018 12:15 PM  Tunneling (cm) 0 04/13/2018  4:00 PM  Undermining (cm) 0 04/13/2018  4:00 PM  Margins Unattached edges (unapproximated) 2018/04/29  9:43 AM  Drainage Amount Minimal Apr 29, 2018  9:43 AM  Drainage Description Serosanguineous 04-29-18  9:43 AM  Treatment Debridement (Selective);Hydrotherapy (Pulse lavage);Packing (Saline gauze) 04/29/2018  9:43 AM   Santyl applied to wound bed prior to applying dressing.    Hydrotherapy Pulsed lavage therapy - wound location:  sacrum Pulsed Lavage with Suction (psi): 12 psi Pulsed Lavage with Suction - Normal Saline Used: 1000 mL Pulsed Lavage Tip: Tip with splash shield Selective Debridement Selective Debridement - Location: sacrum Selective Debridement - Tools Used: Forceps;Scalpel Selective Debridement - Tissue Removed: minimal black necrotic tissue. Crosshatched tough eschar with scalpel   Wound Assessment and Plan  Wound Therapy - Assess/Plan/Recommendations Wound Therapy - Clinical Statement: Eschar covering wound remains very tough. Continue hydrotherapy to loosen necrotic tissue. Wound Therapy - Functional Problem List: decr mobility Factors Delaying/Impairing Wound Healing: Immobility;Multiple medical problems;Polypharmacy;Incontinence Hydrotherapy Plan: Debridement;Dressing change;Patient/family education;Pulsatile lavage with suction Wound Therapy - Frequency: 6X / week Wound Therapy - Follow Up Recommendations: (LTACH) Wound Plan: see above  Wound Therapy Goals- Improve the function of patient's integumentary system by progressing the wound(s) through the phases of wound healing (inflammation - proliferation - remodeling) by: Decrease Necrotic Tissue to: 90 Decrease Necrotic Tissue - Progress: Progressing toward goal Increase Granulation Tissue to: 10 Increase Granulation Tissue - Progress: Progressing toward goal  Goals will be updated until maximal potential achieved or discharge criteria met.  Discharge criteria: when goals achieved, discharge from hospital, MD decision/surgical intervention, no progress towards goals, refusal/missing three consecutive treatments without notification or medical reason.  GP     [name] 04-29-18, 9:45 AM [place] Pager [phone] Office [phone]

## 2018-05-05 NOTE — Progress Notes (Addendum)
Pharmacy Antibiotic Note  Heather Mckay is a 77 y.o. female admitted on 03/08/2018 with pneumonia.  Pharmacy has been consulted for vancomycin and cefepime dosing.  Plan: Vancomycin 1000 mg IV every 48 hours.  Goal trough 15-20 mcg/mL. Check random vancomycin level when appropriate.   Cefepime 1000 mg IV q 24 hours. Monitor temperature, WBC, RR, FiO2.  Height: 5\' 2"  (157.5 cm) Weight: 186 lb 15.2 oz (84.8 kg) IBW/kg (Calculated) : 50.1  Temp (24hrs), Avg:99.5 F (37.5 C), Min:98.9 F (37.2 C), Max:100.5 F (38.1 C)  Recent Labs  Lab 04/13/18 0541 04/14/18 0342 05/02/2018 0237 04/16/18 0447 05/15/18 0258  WBC 12.0* 13.3* 11.3* 14.4* 16.2*  CREATININE 2.68* 2.89* 2.76* 2.93* 2.88*    Estimated Creatinine Clearance: 16.8 mL/min (A) (by C-G formula based on SCr of 2.88 mg/dL (H)).    Allergies  Allergen Reactions  . Hydroxychloroquine Swelling    Patient reported increased swelling of her thyroid gland area. But patient has thyromegaly. Unclear if it is really from Plaquenil or not.  . Hydrocodone Itching    Itching  . Imdur [Isosorbide Dinitrate] Itching  . Percocet [Oxycodone-Acetaminophen] Itching    Itching    Antimicrobials this admission: Cefepime >> 11/06 - 11/10 Vancomycin >> 10/22 - 10/22, 10/28 -10/28, 11/6 - 11/6 Zosyn >> 10/22-10/22, 10/28 - 10/28 Azithromycin >> 10/20 - 10/24 Ceftriaxone >> 10/20 -10/24  Dose adjustments this admission: N/A, newly restarted Abx for empiric MRSA coverage for acute concern for VAP.  Microbiology results: 11/12 Tracheal aspirate: FEW PSEUDOMONAS AERUGINOSA 10/22 MRSA PCR: Positive  Thank you for allowing pharmacy to be a part of this patient's care.  Evalyn Casco 05/15/2018 1:21 PM

## 2018-05-05 NOTE — Progress Notes (Signed)
This note also relates to the following rows which could not be included: SpO2 - Cannot attach notes to unvalidated device data  PEEP increased to 10 due to patient not holding sats at 92% or greater per sat goal. Patient cuurently sating 92% on PEEP of 10 and 80% FIO2. RT will titrate down as able. Vitals are stable. RT will continue to monitor.

## 2018-05-05 NOTE — Progress Notes (Signed)
[facility]M  I was called emergently to the bedside this afternoon for progressively worsening hypoxemia.  Upon my arrival her O2 saturation was in the high 70s on 10 of PEEP and 100% FiO2.  Respiratory was able to transiently improve oxygenation with bag ventilation through the endotracheal tube.  We made plans for a bronchoscopy but immediately prior to starting the procedure she developed a cardiac arrest.  We performed CPR and administered epinephrine and she regained a pulse after 3 minutes of CPR.  At this point we could hear an audible leak from around her endotracheal tube so this was emergently exchanged.  Her O2 saturation transiently increased but blood pressure continued to plummet.  We started norepinephrine with escalating doses and bolused crystalloid.  However despite these measures she once again developed a cardiac arrest.  We resumed CPR at this point and administered epinephrine again.  During the arrest her neck veins were quite elevated and on auscultation I could not hear breath sounds in the right lung so I decompressed her chest with needle decompression.  Despite this effort cardiac arrest continued and she remained in an agonal rhythm.  We continued CPR for a total of 10 minutes and administered epinephrine every 3 minutes per ACLS protocol.  We also administered 2 A of sodium bicarbonate.  Despite these efforts the patient did not regain a pulse or life-sustaining cardiac rhythm.  She was pronounced dead at 1727.  Roselie Awkward, MD [place] [facility] Pager: [phone] Cell: [phone] If no response, call [phone]

## 2018-05-05 NOTE — Progress Notes (Signed)
Family has left the bedside, belongings sent home with family. Patient transported to morgue.

## 2018-05-05 NOTE — Death Summary Note (Signed)
DEATH SUMMARY   Patient Details  Name: Heather Mckay MRN: [id number] DOB: 1941-02-24  Admission/Discharge Information   Admit Date:  04/18/2018  Date of Death:    Time of Death:    Length of Stay: 2022/08/23  Referring Physician: Dionicia Abler, MD   Reason(s) for Hospitalization  dyspnea  Diagnoses  Preliminary cause of death: ARDS (adult respiratory distress syndrome) ([place]) Secondary Diagnoses (including complications and co-morbidities):  Metapneumovirus infection of the lung Principal Problem:   Sepsis due to pneumonia [facility]) Active Problems:   Chronic diastolic CHF (congestive heart failure) ([place])   Pressure ulcer   Acute respiratory failure with hypoxemia ([place])   Essential hypertension   Dyslipidemia   Acute kidney injury superimposed on chronic kidney disease ([place])   Elevated LFTs   Glasgow coma scale total score 3-8 [facility])   Brief Hospital Course (including significant findings, care, treatment, and services provided and events leading to death)  Heather Mckay is a 77 y.o. year old female who has a past medical history significant for pulmonary hypertension, congestive heart failure, COPD, chronic respiratory failure with hypoxemia who was admitted to our facility for worsening shortness of breath.  On admission she was noted to have bilateral airspace disease and profound hypoxemia so she required mechanical intubation and was admitted to the intensive care unit.  Viral testing showed that she had metapneumovirus.  She was treated for bacterial superinfection with community-acquired pneumonia antibiotics and on admission she required high-dose vasopressors.  Vasopressors were eventually weaned and then diuretic medicines were instituted.  However despite these measures the patient's airspace disease did not significantly improved.  Renal function slowly worsened over several weeks while hospitalized in the intensive care unit.  The patient's family was engaged in conversation with  the critical care team on multiple occasions discussing her overall poor prognosis but they wish for further aggressive care.  Empiric Solu-Medrol was administered for concern over an autoimmune mediated acute pneumonitis.  Despite all of these measures her oxygenation continued to worsen.  Plans were made for tracheostomy but ultimately she was never able to receive that procedure because of her multiple comorbid illnesses and worsening hypoxemia.  On 2018-05-12 she suddenly had rapidly worsening hypoxemia.  We responded with increasing ventilatory support, suctioning from her endotracheal tube but eventually she developed a cardiac arrest.  She was treated per standard ACLS protocol but despite these measures she did not recover.  She died at 77.    Pertinent Labs and Studies  Significant Diagnostic Studies Dg Chest 1 View  Result Date: 03/26/2018 CLINICAL DATA:  Central line placement, intubation EXAM: CHEST  1 VIEW COMPARISON:  Portable chest x-ray of 03/26/2018 and two-view chest x-ray of Apr 18, 2018 FINDINGS: There has been some interval worsening of patchy airspace disease bilaterally most consistent with multifocal pneumonia. Small effusions can't be excluded. Cardiomegaly is stable. The tip of the endotracheal tube is approximately 2.9 cm above the carina. NG tube extends into the stomach. Right central venous line tip overlies the mid SVC and no pneumothorax is seen. Mild cardiomegaly is stable. IMPRESSION: 1. Tip of endotracheal tube approximately 2.9 cm above the carina. 2. Some worsening of diffuse airspace disease most consistent with multifocal pneumonia. Question small effusions. Electronically Signed   By: Ivar Drape M.D.   On: 03/26/2018 12:31   Dg Chest 2 View  Result Date: 2018-04-18 CLINICAL DATA:  Productive cough 5 days with runny nose and congestion. Headache and fever. EXAM: CHEST - 2 VIEW COMPARISON:  08/22/2017 FINDINGS: Lungs are adequately inflated with moderate  airspace consolidation over the left mid to lower lung and mild hazy opacification over the right base compatible with multifocal infection. Possible small amount of left pleural fluid. Multiple calcified hilar lymph nodes. Stable cardiomegaly. Remainder of the exam is unchanged. IMPRESSION: Bilateral multifocal airspace process left worse than right likely infection. Suggestion of small left effusion. Stable cardiomegaly. Electronically Signed   By: Marin Olp M.D.   On: 03/29/2018 13:19   Ct Chest Wo Contrast  Result Date: 04/11/2018 CLINICAL DATA:  Pneumonia. History of hypertension, gastroesophageal reflux, congestive heart failure, asthma, breast lumpectomy. EXAM: CT CHEST WITHOUT CONTRAST TECHNIQUE: Multidetector CT imaging of the chest was performed following the standard protocol without IV contrast. COMPARISON:  Chest 04/10/2018.  CT chest 09/18/2017. FINDINGS: Cardiovascular: Evaluation of vascular structures is limited without IV contrast material. Motion artifact limits examination. Cardiac enlargement. Probable small pericardial effusion. Coronary artery calcifications. Normal caliber thoracic aorta. Mediastinum/Nodes: Endotracheal tube tip is above the carina. Enteric tube tip is off the field of view but the catheter is within the stomach. Multiple calcified lymph nodes in the mediastinum. Evaluation of mediastinal structures is limited without IV contrast material. Lungs/Pleura: Diffuse airspace consolidation in both lungs with air bronchograms. This may be due to edema, multifocal pneumonia, or ARDS. Scattered emphysematous changes. Small pleural effusions. No pneumothorax. Upper Abdomen: Limited visualization. No definite acute abnormality. Musculoskeletal: Degenerative changes in the spine. No destructive bone lesions. IMPRESSION: 1. Diffuse bilateral pulmonary parenchymal consolidation with air bronchograms. Small pleural effusions. 2. Cardiac enlargement with small pericardial effusion.  3. Mild emphysematous changes.  Coronary artery calcifications. Electronically Signed   By: Lucienne Capers M.D.   On: 04/11/2018 04:46   Dg Chest Port 1 View  Result Date: 04/16/2018 CLINICAL DATA:  Acute respiratory failure with hypoxemia EXAM: PORTABLE CHEST 1 VIEW COMPARISON:  04/08/2018 FINDINGS: Endotracheal tube in good position.  NG tube in the stomach. Diffuse bilateral airspace disease right greater than left is unchanged. No significant effusion. Calcified hilar lymph nodes bilaterally IMPRESSION: Extensive bilateral airspace disease right greater than left appears unchanged. Support lines remain in good position. Electronically Signed   By: Franchot Gallo M.D.   On: 04/16/2018 07:04   Dg Chest Port 1 View  Result Date: 04/07/2018 CLINICAL DATA:  77 year old female with acute respiratory failure. Subsequent encounter. EXAM: PORTABLE CHEST 1 VIEW COMPARISON:  04/14/2018. FINDINGS: Endotracheal tube tip 4.3 cm above the carina. Nasogastric tube courses below the diaphragm. Tip is not included on the present exam. Diffuse patchy airspace disease similar to prior exam which may represent multifocal pneumonia. Pulmonary vascular congestion. Possible small pleural effusions. No pneumothorax noted. Calcified hilar mediastinal lymph nodes. Cardiomegaly.  Calcified aorta. IMPRESSION: 1. Similar appearance of diffuse patchy airspace disease which may represent multifocal pneumonia. 2. Pulmonary vascular congestion. 3. Possible small pleural effusions. 4. Calcified mediastinal and hilar lymph nodes. 5. Cardiomegaly. Electronically Signed   By: Genia Del M.D.   On: 04/08/2018 07:11   Dg Chest Port 1 View  Result Date: 04/14/2018 CLINICAL DATA:  Acute respiratory failure. EXAM: PORTABLE CHEST 1 VIEW COMPARISON:  04/13/2018 and older studies. FINDINGS: Extensive bilateral airspace lung opacities are without significant change from the previous day's study. No new lung abnormalities. Small right  pleural effusion. No pneumothorax. Endotracheal tube and nasal/orogastric tube are stable. IMPRESSION: 1. No change in the bilateral, extensive airspace lung opacities, consistent with multifocal pneumonia, since the previous day's exam. No new abnormalities. Support apparatus  is stable. Electronically Signed   By: Lajean Manes M.D.   On: 04/14/2018 07:07   Dg Chest Port 1 View  Result Date: 04/13/2018 CLINICAL DATA:  Acute respiratory failure.  Ventilator support. EXAM: PORTABLE CHEST 1 VIEW COMPARISON:  04/12/2018 FINDINGS: Endotracheal tube tip is 3 cm above the carina. Right internal jugular central line is been removed. Nasogastric tube enters the abdomen. Widespread bilateral pneumonia appears similar compared to the previous studies, possibly slightly improved on the right. No worsening new finding by radiography. Multiple calcified hilar and mediastinal nodes as seen previously. IMPRESSION: Right internal jugular central line now removed. Persistent widespread bilateral pneumonia. There may be slight improvement particularly on the right. Electronically Signed   By: Nelson Chimes M.D.   On: 04/13/2018 06:57   Dg Chest Port 1 View  Result Date: 04/12/2018 CLINICAL DATA:  Acute respiratory failure EXAM: PORTABLE CHEST 1 VIEW COMPARISON:  04/10/2018 FINDINGS: Support Apparatus: --Endotracheal tube: Tip at the level of the clavicular heads. --Enteric tube:Side port projects over the stomach. --Catheter(s):Right internal jugular vein approach central venous catheter tip is at the cavoatrial junction. --Other: None Diffuse bilateral opacities, slightly improved from the prior radiograph. Small pleural effusions and cardiomegaly. IMPRESSION: Little interval change. Slight improvement in aeration of the lungs with persistent widespread pulmonary opacities. Electronically Signed   By: Ulyses Jarred M.D.   On: 04/12/2018 03:40   Dg Chest Port 1 View  Result Date: 04/10/2018 CLINICAL DATA:  Acute  respiratory failure with hypoxia. EXAM: PORTABLE CHEST 1 VIEW COMPARISON:  Radiograph of April 09, 2018. FINDINGS: Endotracheal and nasogastric tubes are unchanged in position. Right internal jugular catheter is unchanged in position. Stable bilateral airspace opacities are noted consistent with pneumonia. No pneumothorax is noted. Pleural effusions cannot be excluded. Bony thorax is unremarkable. IMPRESSION: Stable support apparatus. Stable bilateral diffuse lung opacities as described above. Electronically Signed   By: Marijo Conception, M.D.   On: 04/10/2018 07:39   Dg Chest Port 1 View  Result Date: 04/09/2018 CLINICAL DATA:  Respiratory failure EXAM: PORTABLE CHEST 1 VIEW COMPARISON:  04/07/2018 and prior radiographs FINDINGS: An endotracheal tube with tip 5 cm above the carina, RIGHT IJ central venous catheter with tip overlying the LOWER SVC and NG tube entering the stomach with tip off the field of view again noted. Diffuse bilateral airspace opacities/consolidation again noted and not significantly changed. No pneumothorax. Little significant change from the prior study. IMPRESSION: Unchanged appearance of the chest with diffuse bilateral airspace disease/consolidation. Electronically Signed   By: Margarette Canada M.D.   On: 04/09/2018 07:00   Dg Chest Port 1 View  Result Date: 04/07/2018 CLINICAL DATA:  Pneumonia EXAM: PORTABLE CHEST 1 VIEW COMPARISON:  Radiograph 04/06/2018 FINDINGS: Endotracheal tube, central venous line, and NG tube unchanged. Cardiomegaly and multiple calcified lymph nodes noted. There is dense airspace disease in the RIGHT upper lobe and RIGHT lower lobe not changed. There is consolidation in the LEFT lower lobe with air bronchograms. LEFT upper lobe is only normally aerated lung. IMPRESSION: 1. Stable support apparatus. 2. No change in severe bilateral airspace disease with dense LEFT lower lobe consolidation Electronically Signed   By: Suzy Bouchard M.D.   On: 04/07/2018 08:06    Dg Chest Port 1 View  Result Date: 04/06/2018 CLINICAL DATA:  Acute respiratory failure with hypoxia. EXAM: PORTABLE CHEST 1 VIEW COMPARISON:  One-view chest x-ray 04/03/2018 FINDINGS: The heart is enlarged. Diffuse interstitial and airspace disease is not significantly changed. Endotracheal tube,  NG tube, and right IJ line are stable. IMPRESSION: 1. Diffuse interstitial and airspace disease over multiple days raises concern for ARDS. 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 Pneumonia remains of concern. 2. Little interval change. Electronically Signed   By: San Morelle M.D.   On: 04/06/2018 08:11   Dg Chest Port 1 View  Result Date: 04/03/2018 CLINICAL DATA:  Ventilator dependent EXAM: PORTABLE CHEST 1 VIEW COMPARISON:  04/01/2018 FINDINGS: Right jugular central line, endotracheal tube and nasogastric catheter are noted in satisfactory position. Cardiac shadow is stable. Bilateral hilar and mediastinal calcified lymph nodes are seen. Patchy airspace opacities are again identified and stable given some slight variation in the technical aspects of the images. No pneumothorax is seen. IMPRESSION: Stable bilateral infiltrates right greater than left. Electronically Signed   By: Inez Catalina M.D.   On: 04/03/2018 10:09   Dg Chest Port 1 View  Result Date: 04/01/2018 CLINICAL DATA:  Ventilator dependent EXAM: PORTABLE CHEST 1 VIEW COMPARISON:  03/31/2018 FINDINGS: Endotracheal tube, nasogastric catheter and right jugular central line are again seen and stable. Cardiac shadow is within normal limits. Multiple calcified hilar and mediastinal lymph nodes are seen. Diffuse bilateral infiltrates are noted right greater than left stable from the previous exam. Degenerative changes of the thoracic spine are noted. IMPRESSION: Stable bilateral infiltrates.  No new focal abnormality is noted. Electronically Signed   By: Inez Catalina M.D.   On: 04/01/2018 07:16   Dg Chest Port 1  View  Result Date: 03/31/2018 CLINICAL DATA:  Ventilator dependent EXAM: PORTABLE CHEST 1 VIEW COMPARISON:  March 30, 2018 FINDINGS: The ETT is in good position. The NG tube terminates below today's film. The right central line is in good position. No pneumothorax. Diffuse infiltrate throughout the right lung. Infiltrate in the left mid lower lung is stable. No other interval changes. IMPRESSION: 1. Support apparatus as above. 2. Diffuse bilateral airspace disease, right greater than left with relative sparing of the left upper lobe. Electronically Signed   By: Dorise Bullion III M.D   On: 03/31/2018 17:29   Dg Chest Port 1 View  Result Date: 03/30/2018 CLINICAL DATA:  77 year old female in the ICU with sarcoidosis admitted with shortness of breath, community-acquired pneumonia. EXAM: PORTABLE CHEST 1 VIEW COMPARISON:  03/28/2018 and earlier. FINDINGS: Portable AP semi upright view at 0524 hours. Stable endotracheal tube, right IJ central line and visible enteric tube. Stable lung volumes. Chronic calcified mediastinal and hilar lymph nodes redemonstrated with superimposed confluent bilateral pulmonary opacity, only the left upper lung is partially spared. No pneumothorax. No pleural effusion is evident. Left lung base ventilation has mildly improved such that a portion of the left hemidiaphragm is now visible. Negative visible bowel gas pattern. IMPRESSION: 1.  Stable lines and tubes. 2. Diffuse bilateral airspace disease suggesting ARDS. Slightly improved left lung ventilation since yesterday. Superimposed calcified mediastinal and hilar lymph nodes. Electronically Signed   By: Genevie Ann M.D.   On: 03/30/2018 08:44   Dg Chest Port 1 View  Result Date: 03/28/2018 CLINICAL DATA:  Ventilator dependence. EXAM: PORTABLE CHEST 1 VIEW COMPARISON:  03/27/2018 and priors. FINDINGS: Stable support apparatus. Enlarged cardiac silhouette. Calcific atherosclerotic disease of the aorta. Stable calcified  mediastinal and hilar lymphadenopathy. Persistent bilateral with lower lobe predominance interstitial and airspace opacities. Bullous changes in the lungs are better seen, underline by adjacent airspace disease. Osseous structures are without acute abnormality. Soft tissues are grossly normal. IMPRESSION: Stable support apparatus. Persistent, perhaps slightly worsened, diffuse  bilateral with lower lobe predominance interstitial and airspace opacities. Electronically Signed   By: Fidela Salisbury M.D.   On: 03/28/2018 10:11   Dg Chest Port 1 View  Result Date: 03/27/2018 CLINICAL DATA:  Acute respiratory failure with hypoxemia. EXAM: PORTABLE CHEST 1 VIEW COMPARISON:  Radiograph of March 26, 2018. FINDINGS: Stable cardiomegaly. Endotracheal and nasogastric tubes are unchanged in position. Right internal jugular catheter is unchanged in position. Stable bilateral lung opacities is noted consistent with pneumonia. No pneumothorax is noted. Possible small left pleural effusion is noted. Bony thorax is unremarkable. IMPRESSION: Stable support apparatus. Stable bilateral lung opacities as described above. Electronically Signed   By: Marijo Conception, M.D.   On: 03/27/2018 10:25   Dg Chest Port 1 View  Result Date: 03/26/2018 CLINICAL DATA:  Post intubation. EXAM: PORTABLE CHEST 1 VIEW COMPARISON:  Radiograph earlier this day at 0017 hour. FINDINGS: Endotracheal tube is approximately 3.1 cm from the carina. Enteric tube in place with tip below the diaphragm, not included in the field of view. Small bore catheter in the right supraclavicular soft tissues. Multifocal bilateral consolidations, with improvement from prior exam. Unchanged heart size and mediastinal contours. Calcified lymph nodes in the mediastinum and left hilum. No pneumothorax. Possible small pleural effusions. IMPRESSION: 1. Endotracheal tube 3.1 cm from the carina. Enteric tube in place with tip below the diaphragm not included in the field  of view. 2. Multifocal airspace disease with slight improvement from earlier this day. Suspect small pleural effusions. Electronically Signed   By: Keith Rake M.D.   On: 03/26/2018 04:12   Dg Chest Port 1 View  Result Date: 03/26/2018 CLINICAL DATA:  Shortness of breath EXAM: PORTABLE CHEST 1 VIEW COMPARISON:  03/25/2018 FINDINGS: Cardiomegaly. Severe diffuse bilateral airspace disease, worsening slightly on the right since prior study. This could reflect edema or infection. Possible left pleural effusion. Extensive calcified bilateral hilar lymph nodes. IMPRESSION: Cardiomegaly with extensive diffuse bilateral airspace disease, slightly worsened on the right since prior study, which could reflect pneumonia or CHF. Old granulomatous disease. Electronically Signed   By: Rolm Baptise M.D.   On: 03/26/2018 00:48   Dg Chest Port 1v Same Day  Result Date: 03/25/2018 CLINICAL DATA:  Shortness of breath EXAM: PORTABLE CHEST 1 VIEW COMPARISON:  March 24, 2018 FINDINGS: There is extensive airspace consolidation throughout much of the left lung, similar to 1 day prior. There is patchy airspace opacity throughout the mid and lower lung zones on the right, slightly increased from 1 day prior. There is a probable small left pleural effusion. There is cardiomegaly with pulmonary venous hypertension. There are multiple calcified lymph nodes consistent with prior granulomatous disease. No bone lesions. IMPRESSION: Widespread airspace consolidation bilaterally, felt to represent multifocal pneumonia. Extensive consolidation is noted on the left, stable from 1 day prior. Increase in consolidation in portions of the right mid lower lung zones compared to 1 day prior. Probable small left pleural effusion, stable. Underlying pulmonary vascular congestion, stable. Evidence of prior granulomatous disease with multiple calcified hilar and mediastinal lymph nodes. Electronically Signed   By: Lowella Grip III M.D.    On: 03/25/2018 13:09   Vas [place] Upper Extremity Venous Duplex  Result Date: 03/26/2018 UPPER VENOUS STUDY  Indications: Swelling, and evaluate left IJV Subclavain vein particular Limitations: Bandages, line and Multiple lines. Performing Technologist: Lorina Rabon  Examination Guidelines: A complete evaluation includes B-mode imaging, spectral Doppler, color Doppler, and power Doppler as needed of all accessible portions of each  vessel. Bilateral testing is considered an integral part of a complete examination. Limited examinations for reoccurring indications may be performed as noted.  Right Findings: +----------+------------+----------+---------+-----------+---------------------+ RIGHT     CompressiblePropertiesPhasicitySpontaneous       Summary        +----------+------------+----------+---------+-----------+---------------------+ Subclavian                         Yes       Yes      could not perform                                                        compression due to                                                         multiple lines on                                                               site          +----------+------------+----------+---------+-----------+---------------------+  Left Findings: +----------+------------+----------+---------+-----------+-------+ LEFT      CompressiblePropertiesPhasicitySpontaneousSummary +----------+------------+----------+---------+-----------+-------+ IJV           Full                 Yes       Yes            +----------+------------+----------+---------+-----------+-------+ Subclavian    Full                 Yes       Yes            +----------+------------+----------+---------+-----------+-------+ Axillary      Full                 Yes       Yes            +----------+------------+----------+---------+-----------+-------+ Brachial    Partial                          Yes     Acute   +----------+------------+----------+---------+-----------+-------+ Cephalic      Full                           Yes            +----------+------------+----------+---------+-----------+-------+ Basilic       Full                 Yes       Yes            +----------+------------+----------+---------+-----------+-------+ short segment thrombosed on one of the brachial veins.  Summary:  Right: No evidence of thrombosis in the subclavian. Findings consistent with acute deep vein thrombosis involving the right brachial veins.  *See table(s) above for measurements and observations.  Diagnosing physician: Harold Barban MD Electronically signed by  Harold Barban MD on 03/26/2018 at 10:44:05 PM.    Final    US Abdomen Limited Ruq  Result Date: 03/25/2018 CLINICAL DATA:  Elevated LFTs. EXAM: ULTRASOUND ABDOMEN LIMITED RIGHT UPPER QUADRANT COMPARISON:  None. FINDINGS: Technically limited exam due to patient's medical condition, labored breathing lead to limited scan plane. Gallbladder: Surgically absent. Common bile duct: Diameter: 2-3 mm. Liver: No focal lesion identified. Within normal limits in parenchymal echogenicity. Portal vein is patent on color Doppler imaging with normal direction of blood flow towards the liver. IMPRESSION: 1. Postcholecystectomy without biliary dilatation. 2. Unremarkable sonographic appearance of the visualized liver. Technically challenging exam due to patient's labored breathing. Electronically Signed   By: Keith Rake M.D.   On: 03/25/2018 21:19    Microbiology Recent Results (from the past 240 hour(s))  Culture, bal-quantitative     Status: None   Collection Time: 04/09/18  2:35 PM  Result Value Ref Range Status   Specimen Description BRONCHIAL ALVEOLAR LAVAGE  Final   Special Requests Normal  Final   Gram Stain NO WBC SEEN NO ORGANISMS SEEN   Final   Culture   Final    <1000 CFU/ML Consistent with normal respiratory flora. CORRECTED ON 11/09 AT 1024:  PREVIOUSLY REPORTED AS NO GROWTH Performed at [facility], [place] [address], [place], [place] [address]    Report Status 04/13/2018 FINAL  Final  Acid Fast Smear (AFB)     Status: None   Collection Time: 04/09/18  2:35 PM  Result Value Ref Range Status   AFB Specimen Processing Concentration  Final   Acid Fast Smear Negative  Final    Comment: (NOTE) Performed At: [facility] [place], [place] [id number] Rush Farmer MD EU:[id number]    Source (AFB) BRONCHIAL ALVEOLAR LAVAGE  Final  Fungus Culture With Stain     Status: None   Collection Time: 04/09/18  2:35 PM  Result Value Ref Range Status   Fungus Stain Final report  Final   Fungus (Mycology) Culture Preliminary report  Final    Comment: (NOTE) Performed At: [facility] [facility] [place], [place] [id number] Rush Farmer MD [id number]    Fungal Source BRONCHIAL ALVEOLAR LAVAGE  Final  Fungus Culture Result     Status: None   Collection Time: 04/09/18  2:35 PM  Result Value Ref Range Status   Result 1 Comment  Final    Comment: (NOTE) KOH/Calcofluor preparation:  no fungus observed. Performed At: [facility] [place], [place] [id number] Rush Farmer MD [id number]   Fungal organism reflex     Status: None   Collection Time: 04/09/18  2:35 PM  Result Value Ref Range Status   Fungal result 1 Candida albicans  Final    Comment: (NOTE) Performed At: [facility] [place], [place] [id number] Rush Farmer MD [id number]   Culture, respiratory (non-expectorated)     Status: None (Preliminary result)   Collection Time: 04/16/18 12:05 PM  Result Value Ref Range Status   Specimen Description TRACHEAL ASPIRATE  Final   Special Requests NONE  Final   Gram Stain NO WBC SEEN RARE GRAM POSITIVE COCCI   Final   Culture   Final    FEW PSEUDOMONAS AERUGINOSA SUSCEPTIBILITIES TO FOLLOW Performed at [facility], [place] [address] [place], [place] [address]    Report Status PENDING  Incomplete    Lab Basic Metabolic Panel: Recent Labs  Lab 04/12/18 0409 04/13/18 0541  04/14/18 0342 04/20/2018 0237 04/16/18 0447 04/20/18 0258  NA 142 142 143 142 144 145  K 4.0 3.8 3.9 4.2 3.5 4.8  CL 101 102 104 104 105 106  CO2 _0 21* 23 24  GLUCOSE 216* 232* 287* 297* 288* 250*  BUN 186* 202* 213* 230* 239* 252*  CREATININE 2.49* 2.68* 2.89* 2.76* 2.93* 2.88*  CALCIUM 9.0 8.8* 8.8* 8.6* 8.7* 8.8*  MG 1.9 1.8 1.8 1.9  --   --   PHOS 5.6* 5.9* 5.3* 6.3*  --   --    Liver Function Tests: Recent Labs  Lab 04/16/18 0447 04-20-18 0258  AST 85* 89*  ALT 172* 155*  ALKPHOS 169* 181*  BILITOT 1.7* 1.8*  PROT 6.5 6.4*  ALBUMIN 1.9* 1.9*   No results for input(s): LIPASE, AMYLASE in the last 168 hours. No results for input(s): AMMONIA in the last 168 hours. CBC: Recent Labs  Lab 04/13/18 0541 04/14/18 0342 04/12/2018 0237 04/16/18 0447 2018/04/20 0258  WBC 12.0* 13.3* 11.3* 14.4* 16.2*  NEUTROABS  --   --   --  14.3*  --   HGB 7.3* 7.4* 7.7* 7.5* 7.4*  HCT 24.6* 23.6* 26.6* 24.7* 24.8*  MCV 90.8 88.4 93.7 89.8 89.9  PLT 105* 111* 122* 132* 147*   Cardiac Enzymes: No results for input(s): CKTOTAL, CKMB, CKMBINDEX, TROPONINI in the last 168 hours. Sepsis Labs: Recent Labs  Lab 04/11/18 1404 04/12/18 0409 04/13/18 0541 04/14/18 0342 04/08/2018 0237 04/16/18 0447 04-20-2018 0258  PROCALCITON 6.71 6.31 5.93  --   --   --   --   WBC  --  12.5* 12.0* 13.3* 11.3* 14.4* 16.2*    Procedures/Operations  bronchoscopy   Simonne Maffucci 04-20-2018, 5:41 PM

## 2018-05-05 NOTE — Consult Note (Signed)
[facility] Nurse wound consult note Patient examined in [facility] [phone] after review of information contained on the PIP forms. Reason for Consult: Questionable DTPI to right second toe Wound type: Dry, blackened right second toe tip consistent with dry gangrene, highly likely related to the underlying sepsis.  Per the note from Marni Griffon, ACNP_BC on 2018-05-03 at 9:27 a.m., the patient is not likely to survive this illness. Also, the  Patient has not made significant progress over the last 3 weeks. Measurement: The entire distal half of the right second toe is black and dry.  Purple mottling is present to the right great and 5th toes.  It is highly likely there will be additional tissue mottling given her underlying condition and prognosis. Dressing procedure/placement/frequency: Leave open to air and monitor. Monitor the wound area(s) for worsening of condition such as: Signs/symptoms of infection,  Increase in size,  Development of or worsening of odor, Development of pain, or increased pain at the affected locations.  Notify the medical team if any of these develop.  Thank you for the consult.  Discussed plan of care with the patient and bedside nurse.  [place] nurse will not follow at this time.  Please re-consult the [facility] team if needed.  Val Riles, RN, MSN, CWOCN, CNS-BC, pager [phone]

## 2018-05-05 NOTE — Progress Notes (Signed)
This note also relates to the following rows which could not be included: Pulse Rate - Cannot attach notes to unvalidated device data SpO2 - Cannot attach notes to unvalidated device data  RT NOTE: RT increased FIO2 to 100% due to patient continuing to desat into the mid 80's. MD aware of patient desating and the need for increased FIO2 and PEEP. Vitals are stable. RT will continue to monitor.

## 2018-05-05 NOTE — Progress Notes (Signed)
   2018/05/06 1600  Clinical Encounter Type  Visited With Health care provider  Visit Type Initial;Code  Referral From Other (Comment) (code blue)  Consult/Referral To Chaplain   Responded to code blue.  Present w/ med staff.  No family currently present and dr had already called family, who is en route.  I will pass off to night chaplain who starts at [place] and let dr know also.  Per dr, faith is very important to family.  Myra Gianotti resident, [phone]

## 2018-05-05 NOTE — Anesthesia Preprocedure Evaluation (Deleted)

## 2018-05-05 NOTE — Progress Notes (Signed)
This note also relates to the following rows which could not be included: Pulse Rate - Cannot attach notes to unvalidated device data  RT NOTE: RT called to room by RN due to patient desating into the 80's. Upon arrival to room RT found patient with sat of 83%. RT suctioned patient, obtaining small amount of thick secretions. Peep was increased to 8 and FIO2 increased to 80%. Sats are holding at 93% and vitals are stable. RT will continue to monitor.

## 2018-05-05 NOTE — Procedures (Signed)
Intubation Procedure Note Meli Faley [id number] Nov 08, 1940  Procedure: Intubation Indications: Respiratory insufficiency  Procedure Details Consent: Risks of procedure as well as the alternatives and risks of each were explained to the (patient/caregiver).  Consent for procedure obtained. Time Out: Verified patient identification, verified procedure, site/side was marked, verified correct patient position, special equipment/implants available, medications/allergies/relevent history reviewed, required imaging and test results available.  Performed  Drugs none DL x 1 with MAC 3 blade Grade 1 view 7.5 ET tube passed through cords under direct visualization Placement confirmed with bilateral breath sounds, positive EtCO2 change and smoke in tube   Evaluation Hemodynamic Status: Transient hypotension treated with pressors; O2 sats: stable throughout Patient's Current Condition: stable Complications: No apparent complications Patient did tolerate procedure well. Chest X-ray ordered to verify placement.  CXR: pending.   Simonne Maffucci 2018/05/08

## 2018-05-05 NOTE — Progress Notes (Signed)
 NAME:  Heather Mckay, MRN:  [id number], DOB:  06/16/1940, LOS: 24 ADMISSION DATE:  03/07/2018, CONSULTATION DATE:  10/21 REFERRING MD:  Ghimire, CHIEF COMPLAINT:  Dyspnea   Brief History   77 y/o female with CHF, pulmonary sarcoidosis was admitted on 10/20 to the hospitalist service with dyspnea, myalgias, cough noted to have a positive RVP for metapneumovirus. Concern for LLL CAP.  Intubated with septic shock 10/22.      Past Medical History  Sarcoidosis, Pulmonary hypertension, CHF, HTN, HLD, GERD, Asthma, infasive ductal carcinoma, MGUS, OSA on CPAP and on home O2 Possible sjogrens with positive ANA 1:640, positive Sjogren's ab, positive double-stranded DNA (Sees rheumatologist at [place]) Significant Hospital Events   10/20 Admit 10/21 PCCM consulted 10/22 Early AM intubated, in shock 10/25 Hgb 6.5, 1u pRBC ordered 11/04 Seroquel stopped for prolonged QTc 11/5 Bronch, Heparin started but then held for hemoptysis 11/7 Heparin restarted, empiric steroids 11/8 Started lasix for diuresis 11/9 lasix, heparin held for AKI and bleeding from ETT 11/11: Rising BUN and creatinine, goals of care discussed with son, continues to hold out for miracle, wants aggressive care, tracheostomy placed on hold as patient had tube feeds 11/12: spiking fever, now on cooling blanket. WBC climbing. Sputum culture sent will also send blood.  11/13: Tracheostomy pending, still having temperature.  White blood cell continue to climb.  LFTs remain abnormal, repeating abdominal ultrasound  Consults: date of consult/date signed off & final recs:  10/21 PCCM  11/6 ENT  Procedures (surgical and bedside):  10/22 ETT >   Significant Diagnostic Tests:  12/2016 TTE > RVSP 69 mmHb, LVEF 60-65%, mod LVH, normal left atrium 10/22 TTE >> 55-60%, no wall motion abnormalities, tricuspid valve thickening c/w rheumatic disease, moderate regurgitation, PA peak pressure 64 UE Duplex 10/22 >> positive for deep vein  thrombosis of right brachial veins  CT chest.  04/11/2018-diffuse bilateral consolidation, small pleural effusion.  Mild emphysema, coronary artery calcification.  I have reviewed the images personally. Abdominal ultrasound 11/13:>>> Micro Data:  RVP 10/20 >> positive for metapneumovirus BCx2 10/20 >>negative  Sputum 10/20 >>MRSA pos Tracheal aspirate 11/5 >>  negative BAL 11/5 >>  negative BAL AFB 11/5 >>  negative BAL Fungal 11/5 >> negative Respiratory culture 11/12>>>  Antimicrobials:  Ceftriaxone 10/20 >>10/24 Azithro 10/20 >>10/24 Zosyn/vanc x1 dose 10/21 Vanco (empiric) 11/6 >> 11/8 Cefepime (empiric) 11/6 >> off  Subjective:  White blood cells continued to rise Otherwise no changes Awaiting tracheostomy  Objective   Blood pressure (Abnormal) 149/60, pulse 82, temperature 99.3 F (37.4 C), temperature source Core, resp. rate (Abnormal) 23, height 5' 2" (1.575 m), weight 84.8 kg, SpO2 96 %.    Vent Mode: PRVC FiO2 (%):  [50 %] 50 % Set Rate:  [15 bmp] 15 bmp Vt Set:  [400 mL-430 mL] 400 mL PEEP:  [5 cmH20-8 cmH20] 6 cmH20 Plateau Pressure:  [17 cmH20-25 cmH20] 20 cmH20   Intake/Output Summary (Last 24 hours) at 04/20/2018 0928 Last data filed at 04/14/2018 0800 Gross per 24 hour  Intake 1642.99 ml  Output 1293.6 ml  Net 349.39 ml   Filed Weights   04/07/2018 0500 04/16/18 0500 05/02/2018 0317  Weight: 83.2 kg 84 kg 84.8 kg    Examination:  General: 77-year-old female currently on mechanical ventilation appears uncomfortable HEENT: Orally intubated normocephalic atraumatic mucous membranes moist Pulmonary: Coarse scattered rhonchi throughout Cardiac: Irregularly irregular with atrial fibrillation on telemetry Abdomen: Soft, nontender, no organomegaly Extremities: Generalized edema, warm, dry Derm: Sacral dressing dressed   Neuro: Awake, not able to follow commands, moves spontaneously. GU: Concentrated yellow urine   Resolved Hospital Problem list      Assessment & Plan:   Acute hypoxic respiratory failure, ARDS from Metapneumovirus:  no significant improvement Portable chest x-ray personally reviewed on 11/12:Endotracheal tubes in satisfactory position, aeration looks a little worse compared to prior film with increased patchy infiltrates bilaterally superimposed on right greater than left airspace disease -rising wbc -fever spiking ? HCAP Plan Wean PEEP and FiO2 Follow-up sputum culture For tracheostomy today VAP bundle  Fever and leukocytosis -WBC count climbing Plan Follow-up sputum culture Get abdominal ultrasound Holding off on antibiotics for now  Sarcoidosis, Sjogren's disease, ? Acute inflammatory pneumonitis in addition to metapneumovirus: unclear Plan Changing Solu-Medrol to 20 mg daily, will continue this for 48 hours then discontinue altogether  Pulmonary hypertension Plan Continue daily assessment for diuresis  Acute on chronic renal failure  BUN from 239 up to 252, creatinine has improved marginally from 2.93 down to 2.88 > not a dialysis candidate Plan Change Solu-Medrol to 20 mg daily Strict intake output Daily assessment for diuresis  Acute encephalopathy/ICU delirium Plan No change in Klonopin  No change in fentanyl drip or PRN Versed  Plan to discontinue fentanyl after tracheostomy placed   Transaminitis due to congestive hepatopathy -LFTs actually increased a little since last check, total bilirubin climbing as well -has had prior cholecystectomy last US 10/21 was w/out biliary dilatation  Plan Repeat abdominal ultrasound looking for biliary dilation Repeat a.m. complete metabolic panel  Fluid and electrolyte imbalance: Hypernatremia -Sodium is normalized with free water replacement Plan No change in maintenance free water replacement  Atrial Fib Amiodarone on hold  for concern for amio lung toxicity Plan Holding heparin pending trach Continue 2.5 mg Lopressor every 6  hours  Sacral wound Plan Continue wound care per physical therapy and wound ostomy nurse, getting hydrotherapy py 60 days a week  Constipation  Plan Continuing MiraLAX and PRN Dulcolax  Diabetes with hyperglycemia Plan Sliding scale insulin  Severe protein calorie malnutrition Plan Resume tube feeds following trach  Anemia of critical illness, mild thrombocytopenia -No evidence of bleeding, hemoglobin staying around 7.4-7.7 Plan Trend CBC   Best practice   Diet: Hold tube feeding 11/12 at midnight Pain/Anxiety/Delirium protocol (if indicated): PAD protocol, fentanyl infusion VAP protocol (if indicated): ordered DVT prophylaxis: sub q heparin until tracheostomy then back on heparin infusion GI prophylaxis: famotidine Hyperglycemia protocol: SSI Mobility: bed rest Code Status: full Family Communication: - son updated 11/11:  explained that she will not survive this illness. While there has been some transient improvements here and there with some of her many problems during this admission, overall she has really made no significant progress compared to when I first met her 3 weeks ago.  Given her age, multiple comorbid illnesses I see no likelihood of meaningful recovery Son today who says "with our faith we believe she can survive".  I explained that I believe based on the information available to use any further attempts to prolong her life will cause ongoing suffering with little to no chance of survival.   Critical care time: 31 minutes      E  ACNP-BC [facility] Pager # [phone] OR # [phone] if no answer      

## 2018-05-05 NOTE — Progress Notes (Signed)
Patient acutely decompensating. O2 sats dropped to low 80s, increasing FiO2 which is new in the last hour. Critical Care team with concern for HCAP, worsening. Goals of care discussion with family to be had.   Helayne Seminole, MD

## 2018-05-05 DEATH — deceased

## 2018-05-09 LAB — FUNGUS CULTURE WITH STAIN

## 2018-05-09 LAB — FUNGUS CULTURE RESULT

## 2018-05-09 LAB — FUNGAL ORGANISM REFLEX

## 2018-05-23 LAB — ACID FAST CULTURE WITH REFLEXED SENSITIVITIES (MYCOBACTERIA): Acid Fast Culture: NEGATIVE

## 2018-05-23 LAB — ACID FAST CULTURE WITH REFLEXED SENSITIVITIES

## 2018-11-17 IMAGING — CT CT CHEST W/O CM
2 of 3 series · 15 of 36 positions shown, 18 images · non-contrast
Comparison: Chest 04/10/2018.  CT chest 09/18/2017.

CLINICAL DATA: Pneumonia. History of hypertension, gastroesophageal
reflux, congestive heart failure, asthma, breast lumpectomy.

EXAM:
CT CHEST WITHOUT CONTRAST
TECHNIQUE: Multidetector CT imaging of the chest was performed following the
standard protocol without IV contrast.

[Series 3: chest w/o 2mm st · axial · non-contrast · 0.77mm/px · z∈[+1198,+1432]mm · 12 of 139 slices shown, 15 images]
[im 11/139  mediastinal]
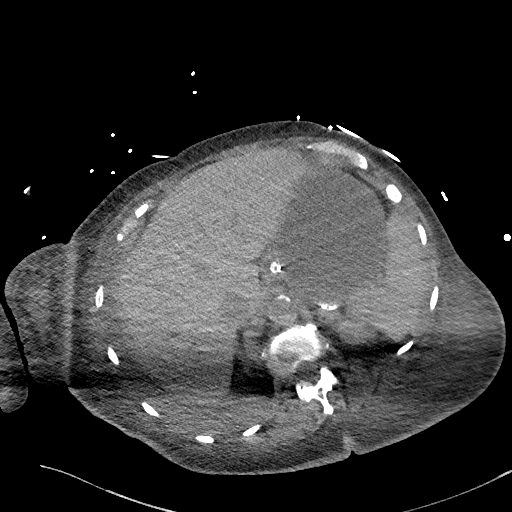
[im 11/139  lung]
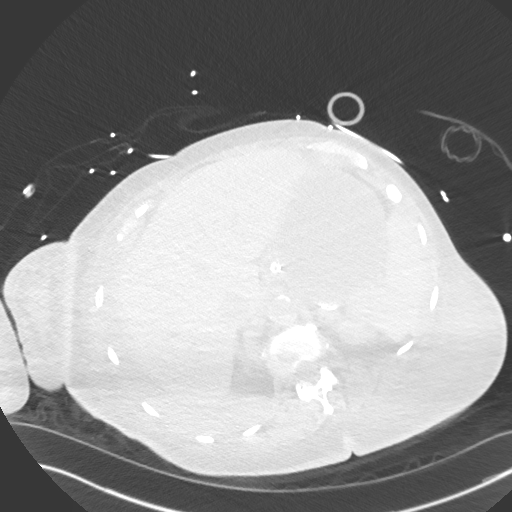
[im 21/139  lung]
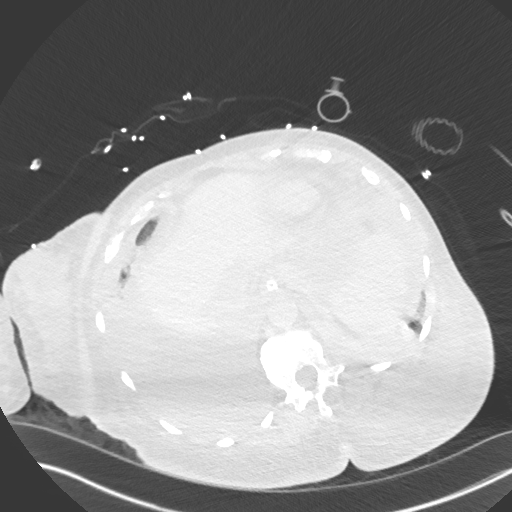
[im 31/139  lung]
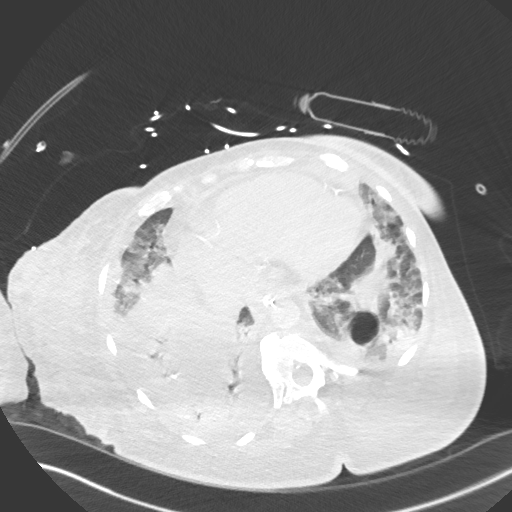
[im 41/139  lung]
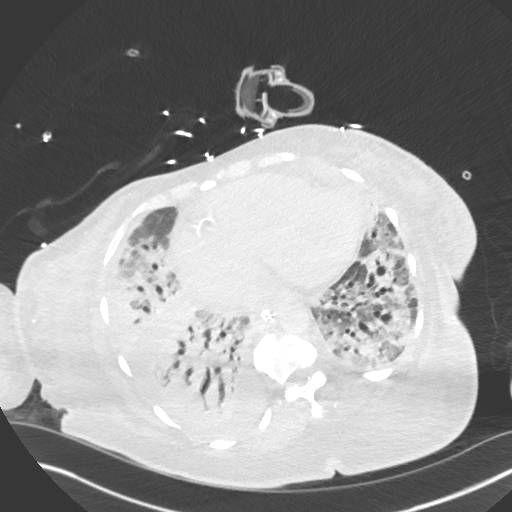
[im 52/139  mediastinal]
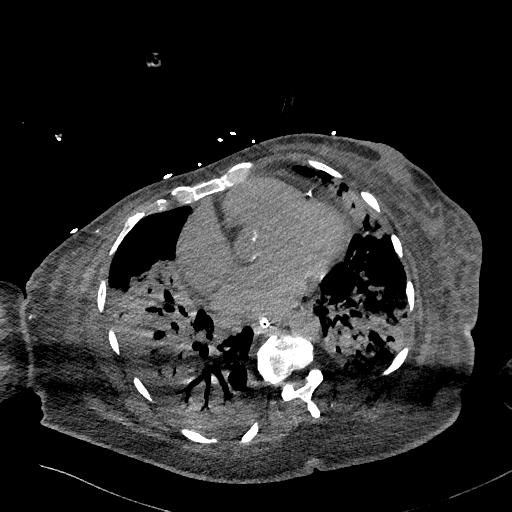
[im 52/139  lung]
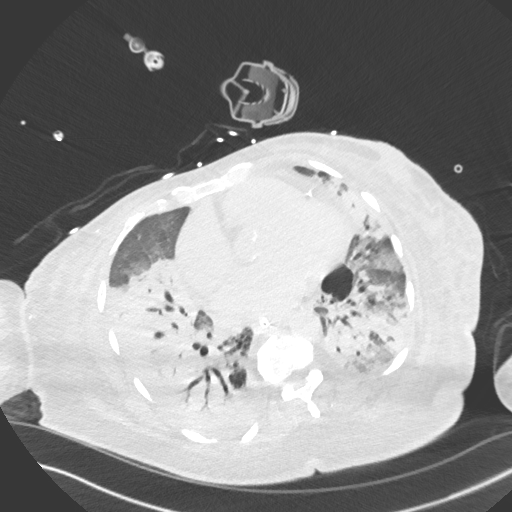
[im 62/139  lung]
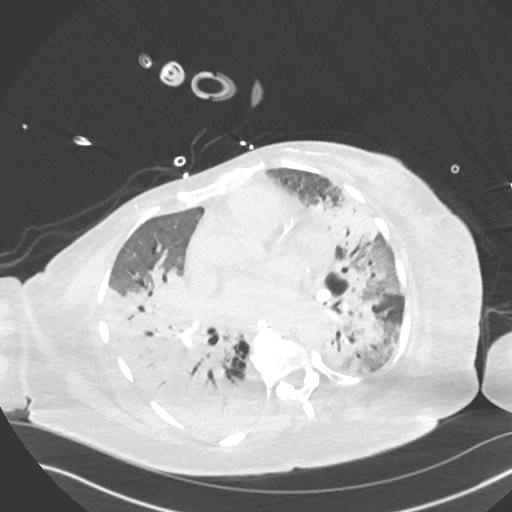
[im 77/139  lung]
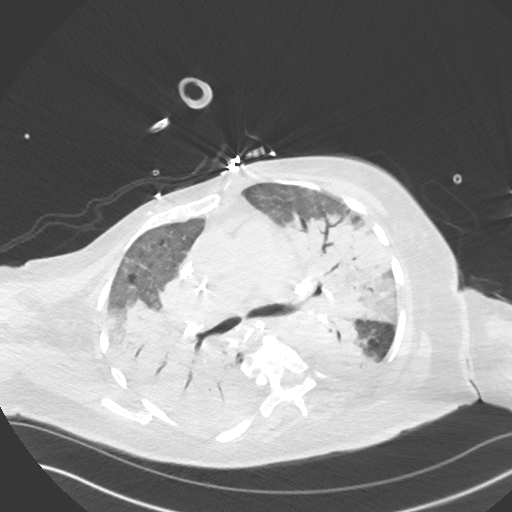
[im 87/139  lung]
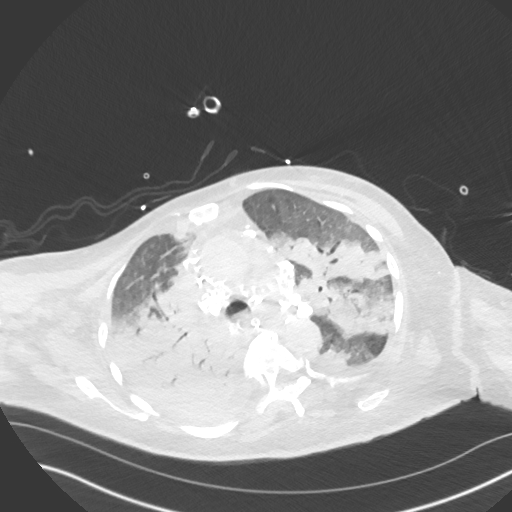
[im 98/139  mediastinal]
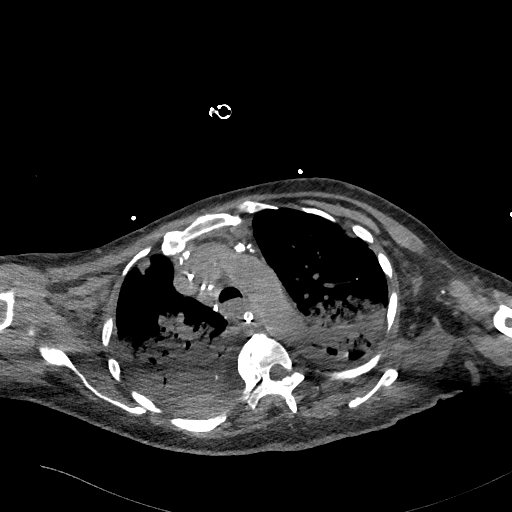
[im 98/139  lung]
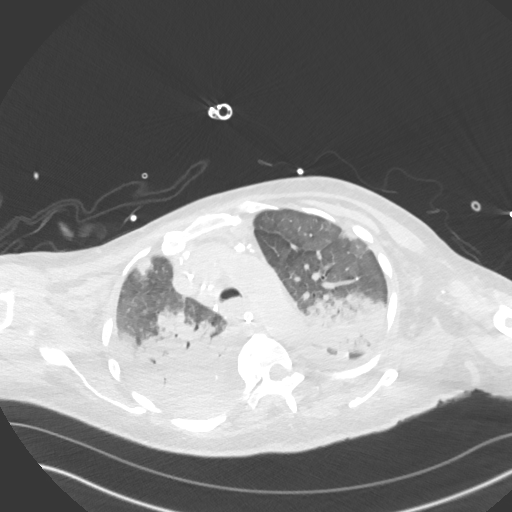
[im 108/139  lung]
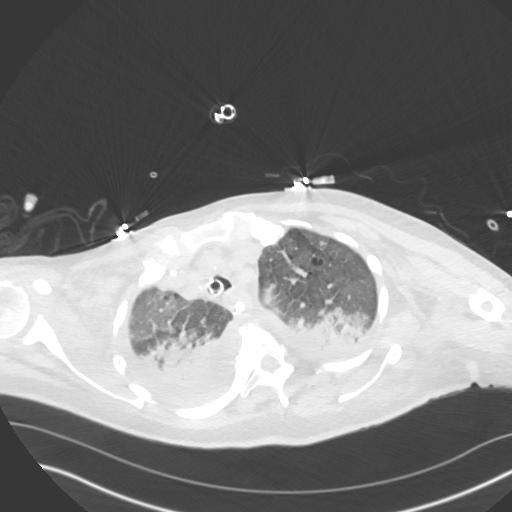
[im 118/139  lung]
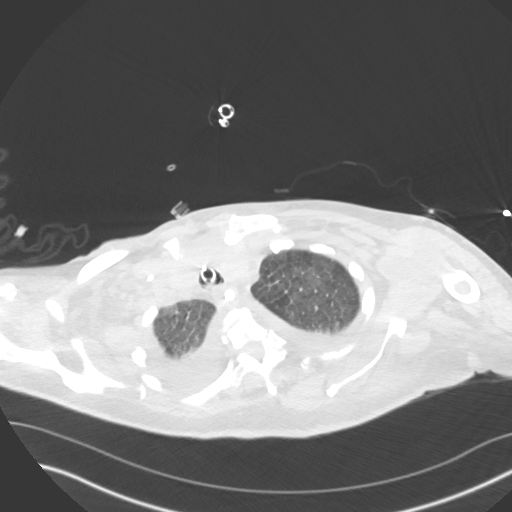
[im 128/139  lung]
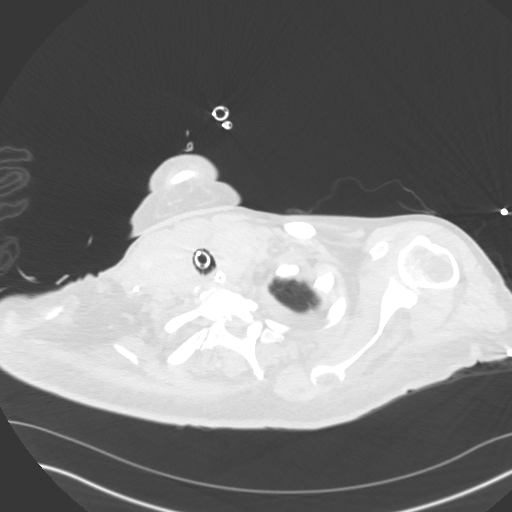

[Series 6: chest w/o 3mm st cor · coronal · non-contrast · 0.54mm/px · 3 of 89 slices shown]
[im 18/89  lung]
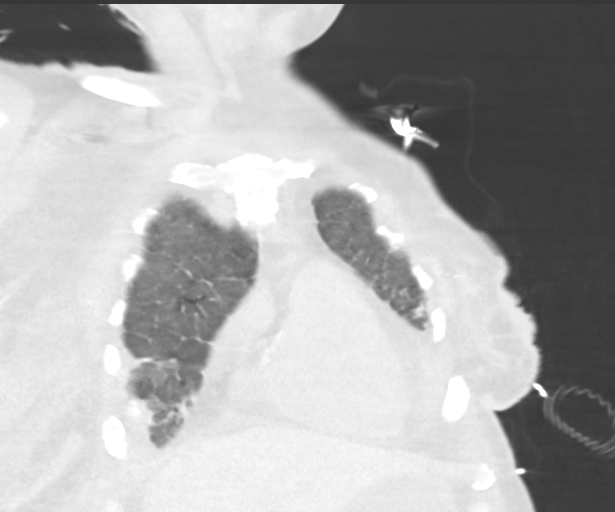
[im 36/89  lung]
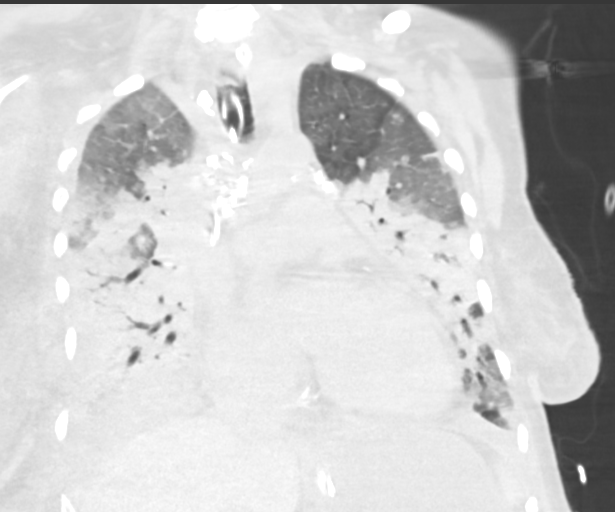
[im 53/89  lung]
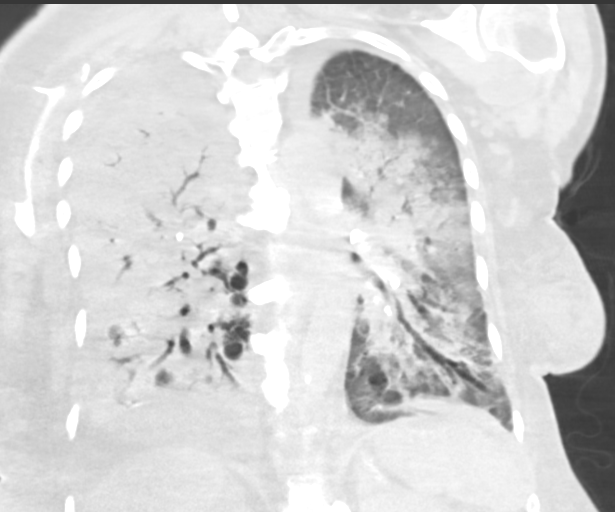

[15 of 36 positions shown; findings below may reference images not displayed]

FINDINGS: Cardiovascular: Evaluation of vascular structures is limited without
IV contrast material. Motion artifact limits examination. Cardiac
enlargement. Probable small pericardial effusion. Coronary artery
calcifications. Normal caliber thoracic aorta.

Mediastinum/Nodes: Endotracheal tube tip is above the carina.
Enteric tube tip is off the field of view but the catheter is within
the stomach. Multiple calcified lymph nodes in the mediastinum.
Evaluation of mediastinal structures is limited without IV contrast
material.

Lungs/Pleura: Diffuse airspace consolidation in both lungs with air
bronchograms. This may be due to edema, multifocal pneumonia, or
ARDS. Scattered emphysematous changes. Small pleural effusions. No
pneumothorax.

Upper Abdomen: Limited visualization. No definite acute abnormality.

Musculoskeletal: Degenerative changes in the spine. No destructive
bone lesions.
IMPRESSION: 1. Diffuse bilateral pulmonary parenchymal consolidation with air
bronchograms. Small pleural effusions.
2. Cardiac enlargement with small pericardial effusion.
3. Mild emphysematous changes.  Coronary artery calcifications.

## 2018-11-21 IMAGING — DX DG CHEST 1V PORT
1 series · 1 of 1 positions shown · non-contrast
Comparison: 04/14/2018.

CLINICAL DATA: 77-year-old female with acute respiratory failure.
Subsequent encounter.

EXAM:
PORTABLE CHEST 1 VIEW

[chest]
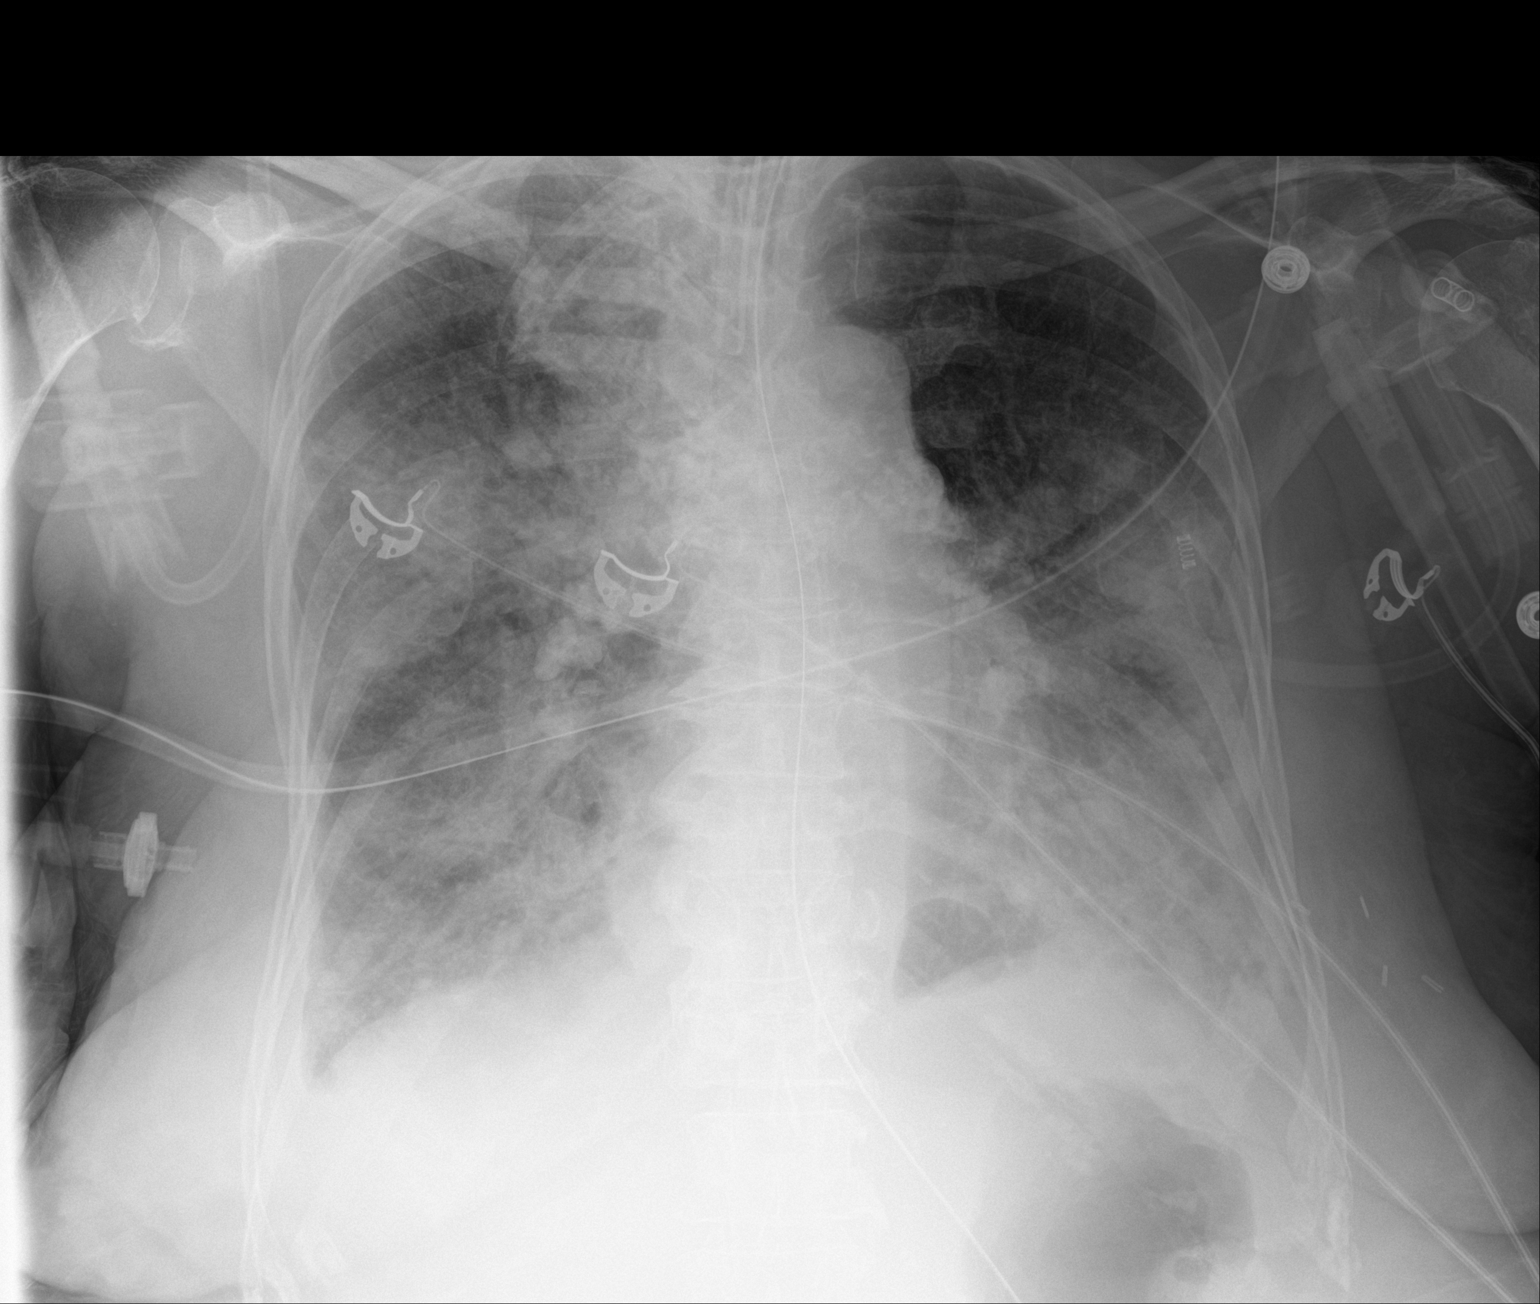

[1 of 1 positions shown; findings below may reference images not displayed]

FINDINGS: Endotracheal tube tip 4.3 cm above the carina. Nasogastric tube
courses below the diaphragm. Tip is not included on the present
exam.

Diffuse patchy airspace disease similar to prior exam which may
represent multifocal pneumonia. Pulmonary vascular congestion.
Possible small pleural effusions. No pneumothorax noted.

Calcified hilar mediastinal lymph nodes.

Cardiomegaly.  Calcified aorta.
IMPRESSION: 1. Similar appearance of diffuse patchy airspace disease which may
represent multifocal pneumonia.
2. Pulmonary vascular congestion.
3. Possible small pleural effusions.
4. Calcified mediastinal and hilar lymph nodes.
5. Cardiomegaly.
